# Patient Record
Sex: Male | Born: 1960 | Race: White | Hispanic: No | Smoking: Former smoker
Health system: Southern US, Community
[De-identification: ages and names within clinical notes are randomized; demographics above are authoritative.]

## PROBLEM LIST (undated history)

## (undated) DIAGNOSIS — Z952 Presence of prosthetic heart valve: Secondary | ICD-10-CM

## (undated) DIAGNOSIS — E7800 Pure hypercholesterolemia, unspecified: Secondary | ICD-10-CM

## (undated) DIAGNOSIS — I10 Essential (primary) hypertension: Secondary | ICD-10-CM

## (undated) DIAGNOSIS — I71019 Dissection of thoracic aorta, unspecified: Secondary | ICD-10-CM

## (undated) DIAGNOSIS — I441 Atrioventricular block, second degree: Secondary | ICD-10-CM

## (undated) HISTORY — DX: Atrioventricular block, second degree: I44.1

## (undated) HISTORY — DX: Dissection of thoracic aorta, unspecified: I71.019

## (undated) HISTORY — PX: AORTIC VALVE REPLACEMENT: SHX41

## (undated) HISTORY — PX: OTHER SURGICAL HISTORY: SHX169

## (undated) HISTORY — DX: Presence of prosthetic heart valve: Z95.2

---

## 2010-09-24 HISTORY — PX: COLONOSCOPY: SHX174

## 2016-03-31 ENCOUNTER — Emergency Department (HOSPITAL_COMMUNITY): Payer: 59 | Admitting: Certified Registered Nurse Anesthetist

## 2016-03-31 ENCOUNTER — Inpatient Hospital Stay (HOSPITAL_COMMUNITY)
Admission: EM | Admit: 2016-03-31 | Discharge: 2016-04-16 | DRG: 219 | Disposition: A | Discharge status: Home / Self Care | Payer: 59 | Attending: Cardiothoracic Surgery | Admitting: Cardiothoracic Surgery

## 2016-03-31 ENCOUNTER — Encounter (HOSPITAL_COMMUNITY)
Admission: EM | Disposition: A | Discharge status: Home / Self Care | Payer: Self-pay | Source: Home / Self Care | Attending: Cardiothoracic Surgery

## 2016-03-31 ENCOUNTER — Encounter (HOSPITAL_COMMUNITY): Payer: Self-pay

## 2016-03-31 ENCOUNTER — Emergency Department (HOSPITAL_COMMUNITY): Payer: 59

## 2016-03-31 DIAGNOSIS — I959 Hypotension, unspecified: Secondary | ICD-10-CM | POA: Diagnosis not present

## 2016-03-31 DIAGNOSIS — N400 Enlarged prostate without lower urinary tract symptoms: Secondary | ICD-10-CM | POA: Diagnosis present

## 2016-03-31 DIAGNOSIS — K7200 Acute and subacute hepatic failure without coma: Secondary | ICD-10-CM | POA: Diagnosis not present

## 2016-03-31 DIAGNOSIS — J90 Pleural effusion, not elsewhere classified: Secondary | ICD-10-CM | POA: Diagnosis not present

## 2016-03-31 DIAGNOSIS — I713 Abdominal aortic aneurysm, ruptured, unspecified: Secondary | ICD-10-CM

## 2016-03-31 DIAGNOSIS — E785 Hyperlipidemia, unspecified: Secondary | ICD-10-CM | POA: Diagnosis present

## 2016-03-31 DIAGNOSIS — D696 Thrombocytopenia, unspecified: Secondary | ICD-10-CM | POA: Diagnosis not present

## 2016-03-31 DIAGNOSIS — J9601 Acute respiratory failure with hypoxia: Secondary | ICD-10-CM | POA: Diagnosis not present

## 2016-03-31 DIAGNOSIS — I10 Essential (primary) hypertension: Secondary | ICD-10-CM | POA: Insufficient documentation

## 2016-03-31 DIAGNOSIS — Z87891 Personal history of nicotine dependence: Secondary | ICD-10-CM | POA: Diagnosis not present

## 2016-03-31 DIAGNOSIS — E872 Acidosis: Secondary | ICD-10-CM | POA: Diagnosis not present

## 2016-03-31 DIAGNOSIS — D62 Acute posthemorrhagic anemia: Secondary | ICD-10-CM | POA: Insufficient documentation

## 2016-03-31 DIAGNOSIS — E871 Hypo-osmolality and hyponatremia: Secondary | ICD-10-CM | POA: Diagnosis not present

## 2016-03-31 DIAGNOSIS — N990 Postprocedural (acute) (chronic) kidney failure: Secondary | ICD-10-CM | POA: Diagnosis not present

## 2016-03-31 DIAGNOSIS — T85598A Other mechanical complication of other gastrointestinal prosthetic devices, implants and grafts, initial encounter: Secondary | ICD-10-CM

## 2016-03-31 DIAGNOSIS — J9690 Respiratory failure, unspecified, unspecified whether with hypoxia or hypercapnia: Secondary | ICD-10-CM

## 2016-03-31 DIAGNOSIS — I7101 Dissection of thoracic aorta: Secondary | ICD-10-CM | POA: Diagnosis not present

## 2016-03-31 DIAGNOSIS — E876 Hypokalemia: Secondary | ICD-10-CM | POA: Insufficient documentation

## 2016-03-31 DIAGNOSIS — Z4659 Encounter for fitting and adjustment of other gastrointestinal appliance and device: Secondary | ICD-10-CM

## 2016-03-31 DIAGNOSIS — R7989 Other specified abnormal findings of blood chemistry: Secondary | ICD-10-CM

## 2016-03-31 DIAGNOSIS — R570 Cardiogenic shock: Secondary | ICD-10-CM | POA: Diagnosis not present

## 2016-03-31 DIAGNOSIS — R0682 Tachypnea, not elsewhere classified: Secondary | ICD-10-CM | POA: Insufficient documentation

## 2016-03-31 DIAGNOSIS — D72829 Elevated white blood cell count, unspecified: Secondary | ICD-10-CM | POA: Diagnosis not present

## 2016-03-31 DIAGNOSIS — Z6836 Body mass index (BMI) 36.0-36.9, adult: Secondary | ICD-10-CM | POA: Diagnosis not present

## 2016-03-31 DIAGNOSIS — I351 Nonrheumatic aortic (valve) insufficiency: Secondary | ICD-10-CM | POA: Diagnosis present

## 2016-03-31 DIAGNOSIS — J9811 Atelectasis: Secondary | ICD-10-CM

## 2016-03-31 DIAGNOSIS — Z8249 Family history of ischemic heart disease and other diseases of the circulatory system: Secondary | ICD-10-CM

## 2016-03-31 DIAGNOSIS — R739 Hyperglycemia, unspecified: Secondary | ICD-10-CM | POA: Diagnosis not present

## 2016-03-31 DIAGNOSIS — R0602 Shortness of breath: Secondary | ICD-10-CM

## 2016-03-31 DIAGNOSIS — I313 Pericardial effusion (noninflammatory): Secondary | ICD-10-CM | POA: Diagnosis not present

## 2016-03-31 DIAGNOSIS — R079 Chest pain, unspecified: Secondary | ICD-10-CM | POA: Diagnosis not present

## 2016-03-31 DIAGNOSIS — E7800 Pure hypercholesterolemia, unspecified: Secondary | ICD-10-CM | POA: Diagnosis present

## 2016-03-31 DIAGNOSIS — I7103 Dissection of thoracoabdominal aorta: Secondary | ICD-10-CM | POA: Diagnosis not present

## 2016-03-31 DIAGNOSIS — D689 Coagulation defect, unspecified: Secondary | ICD-10-CM | POA: Diagnosis not present

## 2016-03-31 DIAGNOSIS — E669 Obesity, unspecified: Secondary | ICD-10-CM | POA: Insufficient documentation

## 2016-03-31 DIAGNOSIS — I7100 Dissection of unspecified site of aorta: Secondary | ICD-10-CM

## 2016-03-31 DIAGNOSIS — Z951 Presence of aortocoronary bypass graft: Secondary | ICD-10-CM

## 2016-03-31 DIAGNOSIS — Z9889 Other specified postprocedural states: Secondary | ICD-10-CM

## 2016-03-31 DIAGNOSIS — Z978 Presence of other specified devices: Secondary | ICD-10-CM

## 2016-03-31 DIAGNOSIS — R1310 Dysphagia, unspecified: Secondary | ICD-10-CM | POA: Insufficient documentation

## 2016-03-31 DIAGNOSIS — R509 Fever, unspecified: Secondary | ICD-10-CM | POA: Insufficient documentation

## 2016-03-31 DIAGNOSIS — R778 Other specified abnormalities of plasma proteins: Secondary | ICD-10-CM

## 2016-03-31 DIAGNOSIS — T502X5A Adverse effect of carbonic-anhydrase inhibitors, benzothiadiazides and other diuretics, initial encounter: Secondary | ICD-10-CM | POA: Diagnosis not present

## 2016-03-31 DIAGNOSIS — I71019 Dissection of thoracic aorta, unspecified: Secondary | ICD-10-CM | POA: Diagnosis present

## 2016-03-31 DIAGNOSIS — R5381 Other malaise: Secondary | ICD-10-CM | POA: Diagnosis not present

## 2016-03-31 DIAGNOSIS — R9431 Abnormal electrocardiogram [ECG] [EKG]: Secondary | ICD-10-CM

## 2016-03-31 HISTORY — PX: REPLACEMENT ASCENDING AORTA: SHX6068

## 2016-03-31 HISTORY — DX: Pure hypercholesterolemia, unspecified: E78.00

## 2016-03-31 HISTORY — DX: Essential (primary) hypertension: I10

## 2016-03-31 LAB — PREPARE RBC (CROSSMATCH)

## 2016-03-31 LAB — ABO/RH: ABO/RH(D): B NEG

## 2016-03-31 LAB — BASIC METABOLIC PANEL
Anion gap: 8 (ref 5–15)
BUN: 14 mg/dL (ref 6–20)
CHLORIDE: 107 mmol/L (ref 101–111)
CO2: 24 mmol/L (ref 22–32)
Calcium: 9.6 mg/dL (ref 8.9–10.3)
Creatinine, Ser: 1.33 mg/dL — ABNORMAL HIGH (ref 0.61–1.24)
GFR calc Af Amer: >60 mL/min (ref >60)
GFR, EST NON AFRICAN AMERICAN: 59 mL/min — AB (ref >60)
Glucose, Bld: 115 mg/dL — ABNORMAL HIGH (ref 65–99)
Potassium: 4.4 mmol/L (ref 3.5–5.1)
Sodium: 139 mmol/L (ref 135–145)

## 2016-03-31 LAB — I-STAT ARTERIAL BLOOD GAS, ED
Acid-base deficit: 5 mmol/L — ABNORMAL HIGH (ref 0.0–2.0)
Bicarbonate: 19.9 mEq/L — ABNORMAL LOW (ref 20.0–24.0)
O2 Saturation: 89 %
Patient temperature: 97.9
TCO2: 21 mmol/L (ref 0–100)
pCO2 arterial: 35.7 mmHg (ref 35.0–45.0)
pH, Arterial: 7.352 (ref 7.350–7.450)
pO2, Arterial: 58 mmHg — ABNORMAL LOW (ref 80.0–100.0)

## 2016-03-31 LAB — ECHOCARDIOGRAM LIMITED
Height: 74 in
Weight: 4560 oz

## 2016-03-31 LAB — I-STAT TROPONIN, ED
TROPONIN I, POC: 0.09 ng/mL — AB (ref 0.00–0.08)
Troponin i, poc: 0.02 ng/mL (ref 0.00–0.08)

## 2016-03-31 LAB — APTT: aPTT: 33 seconds (ref 24–37)

## 2016-03-31 LAB — CBC
HEMATOCRIT: 42.7 % (ref 39.0–52.0)
Hemoglobin: 15 g/dL (ref 13.0–17.0)
MCH: 32.8 pg (ref 26.0–34.0)
MCHC: 35.1 g/dL (ref 30.0–36.0)
MCV: 93.2 fL (ref 78.0–100.0)
Platelets: 211 10*3/uL (ref 150–400)
RBC: 4.58 MIL/uL (ref 4.22–5.81)
RDW: 13.3 % (ref 11.5–15.5)
WBC: 6.1 10*3/uL (ref 4.0–10.5)

## 2016-03-31 LAB — D-DIMER, QUANTITATIVE (NOT AT ARMC): D DIMER QUANT: 19.54 ug{FEU}/mL — AB (ref 0.00–0.50)

## 2016-03-31 LAB — PROTIME-INR
INR: 1.12 (ref 0.00–1.49)
PROTHROMBIN TIME: 14.6 s (ref 11.6–15.2)

## 2016-03-31 SURGERY — REPLACEMENT, AORTA, ASCENDING
Anesthesia: General | Site: Chest

## 2016-03-31 MED ORDER — FENTANYL CITRATE (PF) 250 MCG/5ML IJ SOLN
INTRAMUSCULAR | Status: DC | PRN
  Administered 2016-03-31: 300 ug via INTRAVENOUS
  Administered 2016-03-31 (×3): 150 ug via INTRAVENOUS
  Administered 2016-03-31: 50 ug via INTRAVENOUS
  Administered 2016-03-31: 150 ug via INTRAVENOUS
  Administered 2016-03-31 (×3): 100 ug via INTRAVENOUS
  Administered 2016-03-31: 50 ug via INTRAVENOUS
  Administered 2016-03-31 (×2): 100 ug via INTRAVENOUS

## 2016-03-31 MED ORDER — BISACODYL 5 MG PO TBEC: 5.0000 mg | DELAYED_RELEASE_TABLET | Freq: Once | ORAL | Status: DC

## 2016-03-31 MED ORDER — MIDAZOLAM HCL 10 MG/2ML IJ SOLN
INTRAMUSCULAR | Status: AC
  Filled 2016-03-31: qty 4

## 2016-03-31 MED ORDER — EPINEPHRINE HCL 1 MG/ML IJ SOLN
0.0000 ug/min | INTRAMUSCULAR | Status: AC
  Administered 2016-04-01: 2 ug/min via INTRAVENOUS
  Administered 2016-04-01: 3 ug/min via INTRAVENOUS
  Filled 2016-03-31: qty 4

## 2016-03-31 MED ORDER — MORPHINE SULFATE (PF) 4 MG/ML IV SOLN
4.0000 mg | Freq: Once | INTRAVENOUS | Status: AC
  Administered 2016-03-31: 4 mg via INTRAVENOUS
  Filled 2016-03-31: qty 1

## 2016-03-31 MED ORDER — ESMOLOL HCL-SODIUM CHLORIDE 2000 MG/100ML IV SOLN
25.0000 ug/kg/min | Freq: Once | INTRAVENOUS | Status: DC
  Filled 2016-03-31: qty 100

## 2016-03-31 MED ORDER — GI COCKTAIL ~~LOC~~
30.0000 mL | Freq: Once | ORAL | Status: AC
Start: 2016-03-31 — End: 2016-03-31
  Administered 2016-03-31: 30 mL via ORAL

## 2016-03-31 MED ORDER — SUCCINYLCHOLINE CHLORIDE 200 MG/10ML IV SOSY
PREFILLED_SYRINGE | INTRAVENOUS | Status: AC
  Filled 2016-03-31: qty 10

## 2016-03-31 MED ORDER — LIDOCAINE 2% (20 MG/ML) 5 ML SYRINGE
INTRAMUSCULAR | Status: AC
  Filled 2016-03-31: qty 5

## 2016-03-31 MED ORDER — CHLORHEXIDINE GLUCONATE 0.12 % MT SOLN
15.0000 mL | Freq: Once | OROMUCOSAL | Status: AC
  Administered 2016-03-31: 15 mL via OROMUCOSAL
  Filled 2016-03-31: qty 15

## 2016-03-31 MED ORDER — FENTANYL CITRATE (PF) 250 MCG/5ML IJ SOLN
INTRAMUSCULAR | Status: AC
  Filled 2016-03-31: qty 30

## 2016-03-31 MED ORDER — LACTATED RINGERS IV SOLN
INTRAVENOUS | Status: DC | PRN
  Administered 2016-03-31 – 2016-04-01 (×2): via INTRAVENOUS

## 2016-03-31 MED ORDER — ASPIRIN 81 MG PO CHEW
324.0000 mg | CHEWABLE_TABLET | Freq: Once | ORAL | Status: AC
  Administered 2016-03-31: 324 mg via ORAL
  Filled 2016-03-31: qty 4

## 2016-03-31 MED ORDER — ONDANSETRON HCL 4 MG/2ML IJ SOLN
4.0000 mg | Freq: Once | INTRAMUSCULAR | Status: AC
  Administered 2016-03-31: 4 mg via INTRAVENOUS
  Filled 2016-03-31: qty 2

## 2016-03-31 MED ORDER — PHENYLEPHRINE 40 MCG/ML (10ML) SYRINGE FOR IV PUSH (FOR BLOOD PRESSURE SUPPORT)
PREFILLED_SYRINGE | INTRAVENOUS | Status: AC
  Filled 2016-03-31: qty 10

## 2016-03-31 MED ORDER — NITROGLYCERIN IN D5W 200-5 MCG/ML-% IV SOLN
2.0000 ug/min | INTRAVENOUS | Status: DC
Start: 2016-03-31 — End: 2016-04-01
  Filled 2016-03-31: qty 250

## 2016-03-31 MED ORDER — SODIUM CHLORIDE 0.9 % IV SOLN
INTRAVENOUS | Status: AC
  Administered 2016-03-31: 4 mL
  Administered 2016-03-31: 36 mL
  Filled 2016-03-31: qty 30

## 2016-03-31 MED ORDER — ETOMIDATE 2 MG/ML IV SOLN
INTRAVENOUS | Status: DC | PRN
  Administered 2016-03-31 (×2): 20 mg via INTRAVENOUS

## 2016-03-31 MED ORDER — FAMOTIDINE 20 MG PO TABS
20.0000 mg | ORAL_TABLET | Freq: Once | ORAL | Status: AC
  Administered 2016-03-31: 20 mg via ORAL
  Filled 2016-03-31: qty 1

## 2016-03-31 MED ORDER — LORAZEPAM 1 MG PO TABS
1.0000 mg | ORAL_TABLET | Freq: Once | ORAL | Status: AC
  Administered 2016-03-31: 1 mg via ORAL
  Filled 2016-03-31: qty 1

## 2016-03-31 MED ORDER — ACETAMINOPHEN 325 MG PO TABS
650.0000 mg | ORAL_TABLET | Freq: Once | ORAL | Status: AC
  Administered 2016-03-31: 650 mg via ORAL
  Filled 2016-03-31: qty 2

## 2016-03-31 MED ORDER — MIDAZOLAM HCL 5 MG/5ML IJ SOLN
INTRAMUSCULAR | Status: DC | PRN
  Administered 2016-03-31 (×2): 4 mg via INTRAVENOUS
  Administered 2016-03-31 – 2016-04-01 (×2): 2 mg via INTRAVENOUS
  Administered 2016-04-01: 8 mg via INTRAVENOUS

## 2016-03-31 MED ORDER — LACTATED RINGERS IV SOLN
INTRAVENOUS | Status: DC | PRN
  Administered 2016-03-31: 18:00:00 via INTRAVENOUS

## 2016-03-31 MED ORDER — ROCURONIUM BROMIDE 100 MG/10ML IV SOLN
INTRAVENOUS | Status: DC | PRN
  Administered 2016-03-31: 100 mg via INTRAVENOUS
  Administered 2016-03-31 – 2016-04-01 (×4): 50 mg via INTRAVENOUS

## 2016-03-31 MED ORDER — PHENYLEPHRINE HCL 10 MG/ML IJ SOLN
30.0000 ug/min | INTRAVENOUS | Status: AC
  Administered 2016-03-31 (×2): 20 ug/min via INTRAVENOUS
  Filled 2016-03-31: qty 2

## 2016-03-31 MED ORDER — NITROGLYCERIN 0.4 MG SL SUBL
0.4000 mg | SUBLINGUAL_TABLET | SUBLINGUAL | Status: DC | PRN
  Administered 2016-03-31: 0.4 mg via SUBLINGUAL
  Filled 2016-03-31: qty 1

## 2016-03-31 MED ORDER — TEMAZEPAM 15 MG PO CAPS: 15.0000 mg | ORAL_CAPSULE | Freq: Once | ORAL | Status: DC | PRN

## 2016-03-31 MED ORDER — PROPOFOL 10 MG/ML IV BOLUS
INTRAVENOUS | Status: AC
  Filled 2016-03-31: qty 20

## 2016-03-31 MED ORDER — SODIUM CHLORIDE 0.9 % IV SOLN
INTRAVENOUS | Status: AC
  Administered 2016-03-31: 1.5 [IU]/h via INTRAVENOUS
  Filled 2016-03-31: qty 2.5

## 2016-03-31 MED ORDER — SODIUM CHLORIDE 0.9 % IV BOLUS (SEPSIS)
1000.0000 mL | Freq: Once | INTRAVENOUS | Status: AC
  Administered 2016-03-31: 1000 mL via INTRAVENOUS

## 2016-03-31 MED ORDER — MAGNESIUM SULFATE 50 % IJ SOLN
40.0000 meq | INTRAMUSCULAR | Status: DC
  Filled 2016-03-31: qty 10

## 2016-03-31 MED ORDER — IOPAMIDOL (ISOVUE-370) INJECTION 76%
INTRAVENOUS | Status: AC
  Administered 2016-03-31: 100 mL
  Filled 2016-03-31: qty 100

## 2016-03-31 MED ORDER — DEXTROSE 5 % IV SOLN
750.0000 mg | INTRAVENOUS | Status: DC
  Filled 2016-03-31: qty 750

## 2016-03-31 MED ORDER — 0.9 % SODIUM CHLORIDE (POUR BTL) OPTIME
TOPICAL | Status: DC | PRN
  Administered 2016-03-31: 5000 mL

## 2016-03-31 MED ORDER — METOPROLOL TARTRATE 25 MG PO TABS: 12.5000 mg | ORAL_TABLET | Freq: Once | ORAL | Status: DC

## 2016-03-31 MED ORDER — ROCURONIUM BROMIDE 50 MG/5ML IV SOLN
INTRAVENOUS | Status: AC
  Filled 2016-03-31: qty 3

## 2016-03-31 MED ORDER — EPHEDRINE 5 MG/ML INJ
INTRAVENOUS | Status: AC
  Filled 2016-03-31: qty 10

## 2016-03-31 MED ORDER — METHYLPREDNISOLONE SODIUM SUCC 125 MG IJ SOLR
125.0000 mg | INTRAMUSCULAR | Status: AC
  Administered 2016-03-31: 125 mg via INTRAVENOUS
  Filled 2016-03-31: qty 2

## 2016-03-31 MED ORDER — POTASSIUM CHLORIDE 2 MEQ/ML IV SOLN
80.0000 meq | INTRAVENOUS | Status: DC
  Filled 2016-03-31: qty 40

## 2016-03-31 MED ORDER — PLASMA-LYTE 148 IV SOLN
INTRAVENOUS | Status: AC
  Administered 2016-03-31: 500 mL
  Filled 2016-03-31: qty 2.5

## 2016-03-31 MED ORDER — ROCURONIUM BROMIDE 50 MG/5ML IV SOLN
INTRAVENOUS | Status: AC
  Filled 2016-03-31: qty 1

## 2016-03-31 MED ORDER — SODIUM CHLORIDE 0.9 % IJ SOLN
OROMUCOSAL | Status: DC | PRN
  Administered 2016-03-31 (×5): 4 mL via TOPICAL

## 2016-03-31 MED ORDER — CEFUROXIME SODIUM 1.5 G IJ SOLR
1.5000 g | INTRAMUSCULAR | Status: AC
  Administered 2016-03-31: 1.5 g via INTRAVENOUS
  Administered 2016-04-01: .75 g via INTRAVENOUS
  Filled 2016-03-31: qty 1.5

## 2016-03-31 MED ORDER — VANCOMYCIN HCL 10 G IV SOLR
1250.0000 mg | INTRAVENOUS | Status: AC
  Administered 2016-03-31: 1250 mg via INTRAVENOUS
  Filled 2016-03-31: qty 1250

## 2016-03-31 MED ORDER — DOPAMINE-DEXTROSE 3.2-5 MG/ML-% IV SOLN
0.0000 ug/kg/min | INTRAVENOUS | Status: DC
  Filled 2016-03-31: qty 250

## 2016-03-31 MED ORDER — ALUM & MAG HYDROXIDE-SIMETH 200-200-20 MG/5ML PO SUSP: 30.0000 mL | Freq: Once | ORAL | Status: DC

## 2016-03-31 MED ORDER — DEXMEDETOMIDINE HCL IN NACL 400 MCG/100ML IV SOLN
0.1000 ug/kg/h | INTRAVENOUS | Status: AC
  Administered 2016-03-31: .3 ug/kg/h via INTRAVENOUS
  Filled 2016-03-31 (×3): qty 100

## 2016-03-31 MED ORDER — SUCCINYLCHOLINE 20MG/ML (10ML) SYRINGE FOR MEDFUSION PUMP - OPTIME
INTRAMUSCULAR | Status: DC | PRN
  Administered 2016-03-31: 200 mg via INTRAVENOUS

## 2016-03-31 MED ORDER — SODIUM CHLORIDE 0.9 % IV SOLN
INTRAVENOUS | Status: AC
  Administered 2016-03-31: 69.8 mL/h via INTRAVENOUS
  Filled 2016-03-31 (×2): qty 40

## 2016-03-31 MED ORDER — CHLORHEXIDINE GLUCONATE CLOTH 2 % EX PADS: 6.0000 | MEDICATED_PAD | Freq: Once | CUTANEOUS | Status: DC

## 2016-03-31 MED ORDER — HEPARIN SODIUM (PORCINE) 1000 UNIT/ML IJ SOLN
INTRAMUSCULAR | Status: AC
  Filled 2016-03-31: qty 1

## 2016-03-31 SURGICAL SUPPLY — 112 items
ADAPTER CARDIO PERF ANTE/RETRO (ADAPTER) ×3 IMPLANT
APPLICATOR TIP COSEAL (VASCULAR PRODUCTS) ×3 IMPLANT
ATTRACTOMAT 16X20 MAGNETIC DRP (DRAPES) ×3 IMPLANT
BAG DECANTER FOR FLEXI CONT (MISCELLANEOUS) ×3 IMPLANT
BALLN LINEAR 7.5FR IABP 40CC (BALLOONS) ×3
BALLOON LINEAR 7.5FR IABP 40CC (BALLOONS) ×1 IMPLANT
BLADE STERNUM SYSTEM 6 (BLADE) ×3 IMPLANT
BLADE SURG 15 STRL LF DISP TIS (BLADE) ×2 IMPLANT
BLADE SURG 15 STRL SS (BLADE) ×4
CANISTER SUCTION 2500CC (MISCELLANEOUS) ×3 IMPLANT
CANNULA GRAFT 8MMX50CM (Graft) ×3 IMPLANT
CANNULA GUNDRY RCSP 15FR (MISCELLANEOUS) ×3 IMPLANT
CANNULA SUMP PERICARDIAL (CANNULA) ×3 IMPLANT
CATH COUDE FOLEY 2W 5CC 16FR (CATHETERS) ×3 IMPLANT
CATH FOLEY 2WAY SLVR 18FR 30CC (CATHETERS) ×3 IMPLANT
CATH HEART VENT LEFT (CATHETERS) ×1 IMPLANT
CATH RETROPLEGIA CORONARY 14FR (CATHETERS) IMPLANT
CATH ROBINSON RED A/P 18FR (CATHETERS) ×12 IMPLANT
CAUTERY EYE LOW TEMP 1300F FIN (OPHTHALMIC RELATED) IMPLANT
CAUTERY SURG HI TEMP FINE TIP (MISCELLANEOUS) ×3 IMPLANT
CONT SPECI 4OZ STER CLIK (MISCELLANEOUS) ×3 IMPLANT
COVER SURGICAL LIGHT HANDLE (MISCELLANEOUS) ×6 IMPLANT
CRADLE DONUT ADULT HEAD (MISCELLANEOUS) ×3 IMPLANT
DRAPE CARDIOVASCULAR INCISE (DRAPES) ×2
DRAPE SRG 135X102X78XABS (DRAPES) ×1 IMPLANT
DRSG AQUACEL AG ADV 3.5X14 (GAUZE/BANDAGES/DRESSINGS) ×3 IMPLANT
ELECT CAUTERY BLADE 6.4 (BLADE) IMPLANT
ELECT REM PT RETURN 9FT ADLT (ELECTROSURGICAL) ×6
ELECTRODE REM PT RTRN 9FT ADLT (ELECTROSURGICAL) ×2 IMPLANT
GAUZE SPONGE 4X4 12PLY STRL (GAUZE/BANDAGES/DRESSINGS) ×6 IMPLANT
GLOVE BIO SURGEON STRL SZ 6.5 (GLOVE) ×8 IMPLANT
GLOVE BIO SURGEON STRL SZ7 (GLOVE) ×36 IMPLANT
GLOVE BIO SURGEON STRL SZ7.5 (GLOVE) ×3 IMPLANT
GLOVE BIO SURGEON STRL SZ8 (GLOVE) ×6 IMPLANT
GLOVE BIO SURGEONS STRL SZ 6.5 (GLOVE) ×4
GLOVE BIOGEL PI IND STRL 6 (GLOVE) ×3 IMPLANT
GLOVE BIOGEL PI IND STRL 6.5 (GLOVE) ×1 IMPLANT
GLOVE BIOGEL PI INDICATOR 6 (GLOVE) ×6
GLOVE BIOGEL PI INDICATOR 6.5 (GLOVE) ×2
GOWN STRL REUS W/ TWL LRG LVL3 (GOWN DISPOSABLE) ×4 IMPLANT
GOWN STRL REUS W/ TWL XL LVL3 (GOWN DISPOSABLE) ×2 IMPLANT
GOWN STRL REUS W/TWL LRG LVL3 (GOWN DISPOSABLE) ×8
GOWN STRL REUS W/TWL XL LVL3 (GOWN DISPOSABLE) ×4
GRAFT WOVEN D/V 30DX30L (Vascular Products) ×3 IMPLANT
HANDLE STAPLE ENDO GIA SHORT (STAPLE) ×2
HEMOSTAT POWDER SURGIFOAM 1G (HEMOSTASIS) IMPLANT
HEMOSTAT SURGICEL 2X14 (HEMOSTASIS) IMPLANT
INSERT FOGARTY XLG (MISCELLANEOUS) ×6 IMPLANT
KIT BASIN OR (CUSTOM PROCEDURE TRAY) ×3 IMPLANT
KIT ROOM TURNOVER OR (KITS) ×3 IMPLANT
KIT SUCTION CATH 14FR (SUCTIONS) IMPLANT
LINE VENT (MISCELLANEOUS) ×3 IMPLANT
LOOP VESSEL MINI RED (MISCELLANEOUS) ×3 IMPLANT
LOOP VESSEL SUPERMAXI WHITE (MISCELLANEOUS) ×3 IMPLANT
MARKER GRAFT CORONARY BYPASS (MISCELLANEOUS) IMPLANT
NS IRRIG 1000ML POUR BTL (IV SOLUTION) ×15 IMPLANT
PACK OPEN HEART (CUSTOM PROCEDURE TRAY) ×3 IMPLANT
PAD ARMBOARD 7.5X6 YLW CONV (MISCELLANEOUS) ×6 IMPLANT
RELOAD ENDO GIA 30 3.5 (STAPLE) ×3 IMPLANT
SEALANT SURG COSEAL 8ML (VASCULAR PRODUCTS) ×3 IMPLANT
SET CARDIOPLEGIA MPS 5001102 (MISCELLANEOUS) ×3 IMPLANT
SPONGE GAUZE 4X4 12PLY STER LF (GAUZE/BANDAGES/DRESSINGS) ×3 IMPLANT
SPONGE LAP 18X18 X RAY DECT (DISPOSABLE) ×9 IMPLANT
STAPLER ENDO GIA 12MM SHORT (STAPLE) ×1 IMPLANT
STOPCOCK 4 WAY LG BORE MALE ST (IV SETS) ×3 IMPLANT
SURGIFLO W/THROMBIN 8M KIT (HEMOSTASIS) ×6 IMPLANT
SUT ETHIBON 2 0 V 52N 30 (SUTURE) ×3 IMPLANT
SUT ETHIBOND 2 0 SH (SUTURE) ×3 IMPLANT
SUT PROLENE 3 0 RB 1 (SUTURE) ×3 IMPLANT
SUT PROLENE 3 0 SH DA (SUTURE) ×12 IMPLANT
SUT PROLENE 4 0 RB 1 (SUTURE) ×50
SUT PROLENE 4 0 SH DA (SUTURE) ×51 IMPLANT
SUT PROLENE 4-0 RB1 .5 CRCL 36 (SUTURE) ×22 IMPLANT
SUT PROLENE 4-0 RB1 18X2 ARM (SUTURE) ×3 IMPLANT
SUT PROLENE 5 0 C 1 36 (SUTURE) ×6 IMPLANT
SUT SILK  1 MH (SUTURE) ×10
SUT SILK 1 MH (SUTURE) ×5 IMPLANT
SUT SILK 1 TIES 10X30 (SUTURE) ×3 IMPLANT
SUT SILK 2 0 (SUTURE) ×2
SUT SILK 2 0 SH CR/8 (SUTURE) ×12 IMPLANT
SUT SILK 2-0 18XBRD TIE 12 (SUTURE) ×1 IMPLANT
SUT SILK 3 0 SH CR/8 (SUTURE) ×3 IMPLANT
SUT SILK 3 0 TIES 10X30 (SUTURE) ×3 IMPLANT
SUT SILK 4 0 (SUTURE) ×2
SUT SILK 4-0 18XBRD TIE 12 (SUTURE) ×1 IMPLANT
SUT STEEL STERNAL CCS#1 18IN (SUTURE) ×3 IMPLANT
SUT STEEL SZ 6 DBL 3X14 BALL (SUTURE) ×6 IMPLANT
SUT TEM PAC WIRE 2 0 SH (SUTURE) ×12 IMPLANT
SUT VIC AB 1 CTX 18 (SUTURE) ×3 IMPLANT
SUT VIC AB 2-0 CT1 27 (SUTURE) ×2
SUT VIC AB 2-0 CT1 TAPERPNT 27 (SUTURE) ×1 IMPLANT
SUT VIC AB 2-0 CTX 27 (SUTURE) ×6 IMPLANT
SUT VIC AB 3-0 SH 18 (SUTURE) ×3 IMPLANT
SUT VIC AB 3-0 X1 27 (SUTURE) ×3 IMPLANT
SYR 10ML KIT SKIN ADHESIVE (MISCELLANEOUS) ×9 IMPLANT
SYRINGE TOOMEY DISP (SYRINGE) ×6 IMPLANT
SYSTEM SAHARA CHEST DRAIN ATS (WOUND CARE) ×3 IMPLANT
TAPE CLOTH SURG 4X10 WHT LF (GAUZE/BANDAGES/DRESSINGS) ×3 IMPLANT
TAPE PAPER MEDFIX 1IN X 10YD (GAUZE/BANDAGES/DRESSINGS) ×3 IMPLANT
TOWEL OR 17X24 6PK STRL BLUE (TOWEL DISPOSABLE) ×3 IMPLANT
TOWEL OR 17X26 10 PK STRL BLUE (TOWEL DISPOSABLE) ×3 IMPLANT
TRAY CATH LUMEN 1 20CM STRL (SET/KITS/TRAYS/PACK) ×3 IMPLANT
TRAY FOLEY CATH 14FRSI W/METER (CATHETERS) IMPLANT
TRAY FOLEY IC TEMP SENS 14FR (CATHETERS) IMPLANT
TUBE CONNECTING 12'X1/4 (SUCTIONS) ×1
TUBE CONNECTING 12X1/4 (SUCTIONS) ×2 IMPLANT
TUBE SUCT INTRACARD DLP 20F (MISCELLANEOUS) ×3 IMPLANT
TUBING ART PRESS 48 MALE/FEM (TUBING) ×6 IMPLANT
UNDERPAD 30X30 INCONTINENT (UNDERPADS AND DIAPERS) ×3 IMPLANT
VENT LEFT HEART 12002 (CATHETERS) ×3
WATER STERILE IRR 1000ML POUR (IV SOLUTION) ×6 IMPLANT
YANKAUER SUCT BULB TIP NO VENT (SUCTIONS) ×3 IMPLANT

## 2016-03-31 NOTE — ED Notes (Signed)
Patient here with left anterior chest pain that he describes as a sharp pain with tingling to left arm. Had lightheadedness with same. States that the sympotms started while he was working out today.

## 2016-03-31 NOTE — Consult Note (Signed)
Full note dictated.  CC: difficult foley insertion.  Hx: 55 yo WM on OR table for repair of Aortic Dissection.   Attempt at foley placement was unsuccessful.  PMH: Hypertension and Hypercholesterolemia.  PSH: Appendectomy.  SH: Former smoker.  FH: HTN, CAD  All: NKDA  Meds: Simvastatin, MVI, ASA and Mg/K  ROS:  Pt intubated.  PE: VS reviewed. Gen WD, Obese WM under GA. GU: Nl phallus with adequate meatus.  Scrotum and contents normal.  Procedure:  38fr coude foley catheter placed after betadine prep and 5ml of 2% lidocaine jelly intraurethrally.   Imp:  Difficult foley placement.  Rec: Remove foley per routine.

## 2016-03-31 NOTE — Anesthesia Procedure Notes (Signed)
Procedure Name: Intubation Date/Time: 03/31/2016 6:28 PM Performed by: Lance Coon Pre-anesthesia Checklist: Patient identified, Emergency Drugs available, Timeout performed, Patient being monitored and Suction available Patient Re-evaluated:Patient Re-evaluated prior to inductionOxygen Delivery Method: Circle system utilized Preoxygenation: Pre-oxygenation with 100% oxygen Intubation Type: IV induction, Cricoid Pressure applied and Rapid sequence Laryngoscope Size: Miller and 3 Grade View: Grade II Tube type: Subglottic suction tube Tube size: 8.0 mm Number of attempts: 1 Airway Equipment and Method: Bougie stylet Placement Confirmation: ETT inserted through vocal cords under direct vision,  positive ETCO2 and breath sounds checked- equal and bilateral Secured at: 22 cm Tube secured with: Tape Dental Injury: Teeth and Oropharynx as per pre-operative assessment

## 2016-03-31 NOTE — Consult Note (Signed)
Cardiology Consultation Note    Patient ID: Jon Rodriguez, MRN: [id number], DOB/AGE: August 27, 1961 55 y.o. Admit date: 03/31/2016   Date of Consult: 03/31/2016 Primary Physician: No primary care provider on file. Primary Cardiologist: New to Dr. Sallyanne Kuster  Chief Complaint: chest pain Reason for Consultation: chest pain Requesting MD: Dr. Ashok Cordia  HPI: Jon Rodriguez is a 55 y.o. male with history of HTN, high cholesterol, obesity, former tobacco abuse, and family history of CAD (mother with CABG) who presented to [facility] with CP and elevated troponin level. He has been working out the last 8 weeks in attempt to get healthier. He had just finished bicep curls when he developed sharp chest pain in the middle of his chest which lasted about 30 seconds then progressed to a dull retrosternal pressure. Pain was worse with exhalation. He has noticed some lightheadedness as well. He has not had any recent provocative factors for PE including recent travel, surgery, bedrest although d-dimer returned at 19. He was given NTG in the ER which did not relieve his pain; he was subsequently given morphine which did help but it dropped his pressure to 78/47 -> pain has improved to 2/10. He was also treated for possible reflux with GERD meds. He is pain free now. Not tachycardic. Pulse ox ranging 90-98%. Labs notable for normal CBC, Cr 1.33 without prior to compare to, glucose 115, troponin 0.02 then ?0.09.  Past Medical History  Diagnosis Date  . Hypertension   . High cholesterol       Surgical History: History reviewed. No pertinent past surgical history.   Home Meds: Prior to Admission medications   Medication Sig Start Date End Date Taking? Authorizing Provider  aspirin 325 MG EC tablet Take 325 mg by mouth daily.   Yes Historical Provider, MD  Fish Oil-Cholecalciferol (FISH OIL + D3 PO) Take 2 capsules by mouth daily.   Yes Historical Provider, MD  MAGNESIUM-POTASSIUM PO Take 1 tablet by mouth daily.   Yes  Historical Provider, MD  Multiple Vitamin (MULTIVITAMIN) capsule Take 1 capsule by mouth daily.   Yes Historical Provider, MD  simvastatin (ZOCOR) 20 MG tablet Take 1 tablet by mouth daily. 03/03/16  Yes Historical Provider, MD    Inpatient Medications:       Allergies: No Known Allergies  Social History   Social History  . Marital Status: Divorced    Spouse Name: N/A  . Number of Children: N/A  . Years of Education: N/A   Occupational History  . Not on file.   Social History Main Topics  . Smoking status: Former Research scientist (life sciences)  . Smokeless tobacco: Not on file  . Alcohol Use: No  . Drug Use: Not on file  . Sexual Activity: Not on file   Other Topics Concern  . Not on file   Social History Narrative     Family History  Problem Relation Age of Onset  . Hypertension      SIblings  . CAD Mother     CABG     Review of Systems: All other systems reviewed and are otherwise negative except as noted above.  Labs:  Lab Results  Component Value Date   WBC 6.1 03/31/2016   HGB 15.0 03/31/2016   HCT 42.7 03/31/2016   MCV 93.2 03/31/2016   PLT 211 03/31/2016    Recent Labs Lab 03/31/16 1148  NA 139  K 4.4  CL 107  CO2 24  BUN 14  CREATININE 1.33*  CALCIUM 9.6  GLUCOSE  115*   No results found for: CHOL, HDL, LDLCALC, TRIG Lab Results  Component Value Date   DDIMER 19.54* 03/31/2016    Radiology/Studies:  Dg Chest 2 View  03/31/2016  CLINICAL DATA:  55 year old male with a history of chest pain EXAM: CHEST  2 VIEW COMPARISON:  None. FINDINGS: Cardiac diameter within normal limits. Tortuosity descending thoracic aorta. No evidence of central vascular congestion or interlobular septal thickening. No pleural effusion or pneumothorax.  No confluent airspace disease. No displaced fracture. IMPRESSION: No radiographic evidence of acute cardiopulmonary disease. Signed, Dulcy Fanny. Earleen Newport, DO [facility] Radiology Electronically  Signed   By: Corrie Mckusick D.O.   On: 03/31/2016 12:21    Wt Readings from Last 3 Encounters:  03/31/16 285 lb (129.275 kg)    EKG: NSR 52bpm ST upsloping inferiorly with TWI I, avL, V2 - f/u EKGs are similar - ST elevation 1/4 mm  Physical Exam: Blood pressure 92/56, pulse 67, temperature 97.9 F (36.6 C), temperature source Oral, resp. rate 15, height 6\' 2"  (1.88 m), weight 285 lb (129.275 kg), SpO2 92 %. Body mass index is 36.58 kg/(m^2). General: Well developed, well nourished obese, in no acute distress. Head: Normocephalic, atraumatic, sclera non-icteric, no xanthomas, nares are without discharge.  Neck: Negative for carotid bruits. JVD not elevated. Lungs: Clear bilaterally to auscultation without wheezes, rales, or rhonchi. Breathing is unlabored. Heart: RRR with S1 S2. Normal P2. No murmurs, rubs, or gallops appreciated. Abdomen: Soft, non-tender, non-distended with normoactive bowel sounds. No hepatomegaly. No rebound/guarding. No obvious abdominal masses. Msk:  Strength and tone appear normal for age. Extremities: No clubbing or cyanosis. No edema or erythema.  Distal pedal pulses are 2+ and equal bilaterally. Neuro: Alert and oriented X 3. No facial asymmetry. No focal deficit. Moves all extremities spontaneously. Psych:  Responds to questions appropriately with a normal affect.     Assessment and Plan  The patient was seen and examined by Dr. Sallyanne Kuster, I acted as a Education administrator for this note. See below for A/P. Echo tech called for stat echo.  1. Atypical type chest pain but with elevated troponin level and concern for aortic dissection on CT 2. H/o HTN with hypotension in ER 3. Hyperlipidemia 4. Renal insufficiency of unknown chronicity  Signed, Charlie Pitter PA-C 03/31/2016, 3:54 PM Pager: [phone]   I have seen and examined the patient along with Dayna N Dunn PA-C.  I have reviewed the chart, notes and new data.  I agree with PA's note.  Key new complaints: unusual  chest pain description - severe pulsating retrosternal chest discomfort ("felt I was going to die:) followed by a moderate 4-5/10 uneasy retrosternal pressure. Pain slightly worse during expiration Key examination changes: blood pressure not elevated in either arm - 109/70 before NTG and morphine, 90/60 after those meds; no murmur/bruits/pericardial rub Key new findings / data: minimally abnormal troponin, < 0.5 mm new ST elevation inferior leads, D-dimer>19. CXR with slight mediastinal broadening  PLAN: CTA (contrast entirely in PA) shows a dilated ascending aorta and root with a suspicion of intimal flap/hematoma (no contrast in aorta), images are being repeated with timing for aortic contrast. Appears to have a proximal (type A) aortic dissection. No murmur of AI. ECG a little concerning for RCA involvement. STAT echo ordered. BP remains low and HR slow - no real room for beta blockade. Will nned emergency aortic repair. Briefly discussed with Dr. Prescott Gum.  Sanda Klein, MD, [place] [phone]  03/31/2016, 4:35 PM

## 2016-03-31 NOTE — Anesthesia Preprocedure Evaluation (Signed)
Anesthesia Evaluation  Patient identified by MRN, date of birth, ID band Patient awake    Reviewed: Allergy & Precautions, NPO status , Patient's Chart, lab work & pertinent test results  Airway Mallampati: II  TM Distance: >3 FB Neck ROM: Full    Dental  (+) Teeth Intact   Pulmonary former smoker,    breath sounds clear to auscultation       Cardiovascular hypertension,  Rhythm:Regular Rate:Normal     Neuro/Psych    GI/Hepatic   Endo/Other    Renal/GU      Musculoskeletal   Abdominal (+) + obese,   Peds  Hematology   Anesthesia Other Findings   Reproductive/Obstetrics                             Anesthesia Physical Anesthesia Plan  ASA: IV and emergent  Anesthesia Plan: General   Post-op Pain Management:    Induction: Intravenous  Airway Management Planned: Oral ETT  Additional Equipment: CVP, PA Cath, 3D TEE, Ultrasound Guidance Line Placement and Arterial line  Intra-op Plan:   Post-operative Plan: Post-operative intubation/ventilation  Informed Consent: I have reviewed the patients History and Physical, chart, labs and discussed the procedure including the risks, benefits and alternatives for the proposed anesthesia with the patient or authorized representative who has indicated his/her understanding and acceptance.     Plan Discussed with: CRNA and Anesthesiologist  Anesthesia Plan Comments:         Anesthesia Quick Evaluation

## 2016-03-31 NOTE — H&P (Signed)
[address]       [place], [address]             [phone]        Jon Rodriguez Medical Record [id number] Date of Birth: [birth year]  Referring: ER physician  Primary Care: Melinda Crutch M.D.  Chief Complaint:    Chief Complaint  Patient presents with  . Chest Pain   Patient examined, CT angiogram personally reviewed and discussed with radiologist. 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  History of Present Illness:     55 year old morbidly obese nondiabetic male with history of treated hypertension and moderate smoking felt sudden tearing chest pain at 11 AM today a working out at a fitness center- gym. This did not resolve and he presented to the emergency department. EKG was nonspecific and trach enzymes were first negative and minimally elevated. CT scan rule out pulmonary embolus was negative. The patient was then re-dosed with contrast for a CTA angiogram and this demonstrates a tear in the ascending aorta extending through the arch and descending thoracic aorta down to the aortic abdominal  bifurcation. The innominate artery is not involved by the dissection, the left carotid is not involved, the left subclavian artery appears to be involved with the dissection. The renal and visceral vessels come off the true lumen. The ascending aorta and arch are dilated. The descending thoracic aorta appears to be of normal caliber. There is no clear pericardial effusion.  The patient is still having some substernal pain but not as severe as the onset.  Patient denies family history of aortic thoracic or abdominal aneurysm disease. He has had an appendectomy but otherwise a fairly negative past medical history. He took Norvasc for hypertension but was recently taken off Norvasc after losing weight and his blood pressure normalized.  Current Activity/ Functional Status: Normal function  level-employed as a Biomedical engineer   Zubrod Score: At the time of surgery this patient's most appropriate activity status/level should be described as: []     0    Normal activity, no symptoms []     1    Restricted in physical strenuous activity but ambulatory, able to do out light work []     2    Ambulatory and capable of self care, unable to do work activities, up and about                 more than 50%  Of the time                            []     3    Only limited self care, in bed greater than 50% of waking hours []     4    Completely disabled, no self care, confined to bed or chair []     5    Moribund  Past Medical History  Diagnosis Date  . Hypertension   . High cholesterol     History reviewed. No pertinent past surgical history.  History  Smoking status  . Former Smoker  Smokeless tobacco  . Not on file    History  Alcohol Use No    Social History   Social History  . Marital Status: Divorced    Spouse Name: N/A  . Number of Children: N/A  . Years of Education: N/A   Occupational History  .  Not on file.   Social History Main Topics  . Smoking status: Former Research scientist (life sciences)  . Smokeless tobacco: Not on file  . Alcohol Use: No  . Drug Use: Not on file  . Sexual Activity: Not on file   Other Topics Concern  . Not on file   Social History Narrative    No Known Allergies  Current Facility-Administered Medications  Medication Dose Route Frequency Provider Last Rate Last Dose  . aminocaproic acid (AMICAR) 10 g in sodium chloride 0.9 % 100 mL infusion   Intravenous To OR Shelda Altes, RPH      . bisacodyl (DULCOLAX) EC tablet 5 mg  5 mg Oral Once Ivin Poot, MD      . cefUROXime (ZINACEF) 1.5 g in dextrose 5 % 50 mL IVPB  1.5 g Intravenous To OR Renee J Ackley, RPH      . cefUROXime (ZINACEF) 750 mg in dextrose 5 % 50 mL IVPB  750 mg Intravenous To OR Shelda Altes, RPH      . [START ON 04/01/2016] chlorhexidine (PERIDEX) 0.12 % solution 15 mL  15 mL  Mouth/Throat Once Ivin Poot, MD      . Chlorhexidine Gluconate Cloth 2 % PADS 6 each  6 each Topical Once Ivin Poot, MD      . dexmedetomidine (PRECEDEX) 400 MCG/100ML (4 mcg/mL) infusion  0.1-0.7 mcg/kg/hr Intravenous To OR Shelda Altes, RPH      . DOPamine (INTROPIN) 800 mg in dextrose 5 % 250 mL (3.2 mg/mL) infusion  0-10 mcg/kg/min Intravenous To OR Shelda Altes, RPH      . EPINEPHrine (ADRENALIN) 4 mg in dextrose 5 % 250 mL (0.016 mg/mL) infusion  0-10 mcg/min Intravenous To OR Renee J Ackley, RPH      . esmolol (BREVIBLOC) 2000 mg / 100 mL (20 mg/mL) infusion  25-300 mcg/kg/min Intravenous Once Lajean Saver, MD      . heparin 2,500 Units, papaverine 30 mg in electrolyte-148 (PLASMALYTE-148) 500 mL irrigation   Irrigation To OR Shelda Altes, RPH      . heparin 30,000 units/NS 1000 mL solution for CELLSAVER   Other To OR Shelda Altes, RPH      . insulin regular (NOVOLIN R,HUMULIN R) 250 Units in sodium chloride 0.9 % 250 mL (1 Units/mL) infusion   Intravenous To OR Renee J Ackley, RPH      . magnesium sulfate (IV Push/IM) injection 40 mEq  40 mEq Other To OR Shelda Altes, RPH      . methylPREDNISolone sodium succinate (SOLU-MEDROL) 125 mg/2 mL injection 125 mg  125 mg Intravenous STAT Ivin Poot, MD      . Derrill Memo ON 04/01/2016] metoprolol tartrate (LOPRESSOR) tablet 12.5 mg  12.5 mg Oral Once Ivin Poot, MD      . [name] Sou Hold] nitroGLYCERIN (NITROSTAT) SL tablet 0.4 mg  0.4 mg Sublingual Q5 min PRN Lajean Saver, MD   0.4 mg at 03/31/16 1455  . nitroGLYCERIN 50 mg in dextrose 5 % 250 mL (0.2 mg/mL) infusion  2-200 mcg/min Intravenous To OR Renee J Ackley, RPH      . phenylephrine (NEO-SYNEPHRINE) 20 mg in dextrose 5 % 250 mL (0.08 mg/mL) infusion  30-200 mcg/min Intravenous To OR Shelda Altes, RPH      . potassium chloride injection 80 mEq  80 mEq Other To OR Renee J Ackley, RPH      . temazepam (RESTORIL) capsule 15 mg  15 mg Oral Once PRN Ivin Poot, MD      .  vancomycin (VANCOCIN) 1,250 mg in sodium chloride 0.9 % 250 mL IVPB  1,250 mg Intravenous To OR Shelda Altes, RPH        Prescriptions prior to admission  Medication Sig Dispense Refill Last Dose  . aspirin 325 MG EC tablet Take 325 mg by mouth daily.   03/31/2016 at Unknown time  . Fish Oil-Cholecalciferol (FISH OIL + D3 PO) Take 2 capsules by mouth daily.   03/31/2016 at Unknown time  . MAGNESIUM-POTASSIUM PO Take 1 tablet by mouth daily.   03/31/2016 at Unknown time  . Multiple Vitamin (MULTIVITAMIN) capsule Take 1 capsule by mouth daily.   03/31/2016 at Unknown time  . simvastatin (ZOCOR) 20 MG tablet Take 1 tablet by mouth daily.   03/31/2016 at Unknown time    Family History  Problem Relation Age of Onset  . Hypertension      SIblings  . CAD Mother     CABG     Review of Systems:      Cardiac Review of Systems: Y or N  Chest Pain [ Yes   ]  Resting SOB [  no ] Exertional SOB  [no  ]  Orthopnea [no  ]   Pedal Edema [no  ]    Palpitations [ yes ] Syncope  [ no ]   Presyncope [no  ]  General Review of Systems: [Y] = yes [  ]=no Constitional: recent weight change [  ]; anorexia [  ]; fatigue [  ]; nausea [  ]; night sweats [  ]; fever [  ]; or chills [  ]                                                               Dental: poor dentition[  ]; Last Dentist visit: Every 6 months  Eye : blurred vision [  ]; diplopia [   ]; vision changes [  ];  Amaurosis fugax[  ]; Resp: cough [  ];  wheezing[  ];  hemoptysis[  ]; shortness of breath[  ]; paroxysmal nocturnal dyspnea[  ]; dyspnea on exertion[  ]; or orthopnea[  ];  GI:  gallstones[  ], vomiting[  ];  dysphagia[  ]; melena[  ];  hematochezia [  ]; heartburn[  ];   Hx of  Colonoscopy[  ]; GU: kidney stones [  ]; hematuria[  ];   dysuria [  ];  nocturia[  ];  history of     obstruction [  ]; urinary frequency [  ]             Skin: rash, swelling[  ];, hair loss[  ];  peripheral edema[  ];  or itching[  ]; Musculosketetal: myalgias[  ];  joint  swelling[  ];  joint erythema[  ]; discomfort and left arm, poor left radial pulse  joint pain[  ];  back pain[  ];  Heme/Lymph: bruising[  ];  bleeding[  ];  anemia[  ];  Neuro: TIA[  ];  headaches[  ];  stroke[  ];  vertigo[  ];  seizures[  ];   paresthesias[  ];  difficulty walking[  ]; no peripheral motor weakness other  than slight weakness in left hand grip  Psych:depression[  ]; anxiety[  ];  Endocrine: diabetes[  ];  thyroid dysfunction[  ];  Immunizations: Flu [  ]; Pneumococcal[  ];  Other: Right-hand dominant  Physical Exam: BP 109/48 mmHg  Pulse 57  Temp(Src) 97.9 F (36.6 C) (Oral)  Resp 20  Ht 6\' 2"  (1.88 m)  Wt 285 lb (129.275 kg)  BMI 36.58 kg/m2  SpO2 90%      Physical Exam  General: Middle-aged obese Caucasian male anxious having chest pain and ED HEENT: Normocephalic pupils equal , dentition adequate Neck: Supple without JVD, adenopathy, or bruit Chest: Clear to auscultation, symmetrical breath sounds, no rhonchi, no tenderness             or deformity Cardiovascular: Regular rate and rhythm, no murmur, no gallop, peripheral pulses             palpable in all extremities except for left radial pulse Abdomen:  Soft, nontender, no palpable mass or organomegaly Extremities: Warm, well-perfused, no clubbing cyanosis edema or tenderness,              no venous stasis changes of the legs Rectal/GU: Deferred Neuro: Grossly non--focal and symmetrical throughout Skin: Clean and dry without rash or ulceration    Diagnostic Studies & Laboratory data:     Recent Radiology Findings:   Dg Chest 2 View  03/31/2016  CLINICAL DATA:  55 year old male with a history of chest pain EXAM: CHEST  2 VIEW COMPARISON:  None. FINDINGS: Cardiac diameter within normal limits. Tortuosity descending thoracic aorta. No evidence of central vascular congestion or interlobular septal thickening. No pleural effusion or pneumothorax.  No confluent airspace disease. No displaced fracture.  IMPRESSION: No radiographic evidence of acute cardiopulmonary disease. Signed, Dulcy Fanny. Earleen Newport, DO [facility] Radiology Electronically Signed   By: Corrie Mckusick D.O.   On: 03/31/2016 12:21   Ct Angio Chest Pe W Or Wo Contrast  03/31/2016  EXAM: CT ANGIOGRAPHY CHEST WITH CONTRAST TECHNIQUE: Multidetector CT imaging of the chest was performed using the standard protocol during bolus administration of intravenous contrast. Multiplanar CT image reconstructions and MIPs were obtained to evaluate the vascular anatomy. CONTRAST:  70 mL of Isovue-300. COMPARISON:  No priors. FINDINGS: Mediastinum/Lymph Nodes: There is irregularity of the thoracic aorta. Specifically, associated with the left wall and posterior wall of the aortic root extending into the ascending thoracic aorta to the level the distal arch there is some eccentric high attenuation material which distorts the normal aortic contours. This is strongly suggestive of acute intramural hemorrhage, although given the dilatation of the mid aortic arch, the possibility of concurrent aortic dissection is suspected. The proximal aspect of this process comes in direct contact with the ostium of the left main coronary artery, placing the patient at risk for acute devastating coronary ischemia. Notably, the high attenuation in the ascending thoracic aorta appears to traverse into the adjacent right lateral wall of the pulmonic trunk, indicative of spread of hemorrhage via a common shared adventitia between the vessels. Some of this hemorrhage also extends anteriorly into the middle mediastinum best appreciated on image 62 of series 4 where this currently appears contained. There is another small amount of high attenuation material located anterior to the aortic root on image 73 of series 4, also likely to represent a small amount of hemomediastinum. 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 Ascending aorta is also aneurysmal measuring 5.6 cm. As previously noted, mid arch is aneurysmal measuring 6.8 cm. Descending thoracic aorta is normal in caliber measuring 3.4 cm. There is also coronary artery atherosclerosis, including calcified atherosclerotic plaque in the left main, left anterior descending, left circumflex and right coronary arteries. There is a small amount of pericardial fluid, which appears low-attenuation. No frank hemopericardium is identified at this time. There are no filling defects within the pulmonary arterial tree to suggest underlying pulmonary embolism. Heart size is normal. No pathologically enlarged mediastinal or hilar lymph nodes. Esophagus is unremarkable in appearance. No axillary lymphadenopathy. Lungs/Pleura: No acute consolidative airspace disease. No pleural effusions. No suspicious appearing pulmonary nodules or masses are noted. Upper Abdomen: Colonic diverticulae are noted in the region of the splenic flexure of the colon. Musculoskeletal/Soft Tissues: There are no aggressive appearing lytic or blastic lesions noted in the visualized portions of the skeleton. Review of the MIP images confirms the above findings. IMPRESSION: 1. Findings are compatible with an acute aortic syndrome, as detailed above. The most concerning possibility is that of a type A thoracic aortic dissection with pending compromise of the left main coronary artery, and early acute hemorrhage into the middle mediastinum. No frank hemopericardium identified at this time. 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 These findings would be better evaluated with immediate CTA of the thorax, which is recommended at this time. Emergent cardiothoracic surgery consultation is also strongly recommended. 2. No evidence of pulmonary  embolism. These results were called by telephone at the time of interpretation on 03/31/2016 at 4:18 pm to PA [facility] Dr. Lajean Saver, who verbally acknowledged these results. Electronically Signed   By: Vinnie Langton M.D.   On: 03/31/2016 16:31   Ct Angio Chest/abd/pel For Dissection W And/or W/wo  03/31/2016  CLINICAL DATA:  55 year old male EXAM: CT CHEST, ABDOMEN, AND PELVIS WITH CONTRAST TECHNIQUE: Multidetector CT imaging of the chest, abdomen and pelvis was performed following the standard protocol during bolus administration of intravenous contrast. CONTRAST:  70 mL of Isovue 370 COMPARISON:  PE protocol chest CT 03/31/2016. FINDINGS: CTA CHEST Mediastinum/Lymph Nodes: As previously suspected there is a type A thoracic aortic dissection which originates at the level of the aortic root, likely involving the commissure between the left and non coronary cusps of the aortic valve (best appreciated on axial image 67 of series 4). The dissection flap impinges upon the ostium of the right coronary artery (best appreciated on axial image 59 of series 4). The true lumen gives rise to the left main coronary artery, although the dissection flap does come in very close proximity to the ostium of the vessel, best appreciated on axial images 49 and 50 of series 4. There is IV contrast in the left main, left anterior descending, left circumflex and right coronary arteries at this time, indicative of gross patency. 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 Ascending thoracic aorta is aneurysmal measuring 5.7 cm in diameter. Mid aortic arch is aneurysmal measuring 6.8 cm in diameter. The dissection flap extends into the ostium and proximal aspect of the left subclavian artery. The great vessels of the mediastinum are incompletely visualized secondary to incomplete thoracic coverage, however, the visualized portions of the proximal  brachiocephalic artery and left common carotid artery do not appear  to be involved with the dissection at this time. The dissection extends into the pelvis where it appears to terminate at the level of the aortic bifurcation, as detailed below. As previously described, there is some high attenuation material located between the aortic root and proximal ascending thoracic aorta, and the adjacent pulmonic trunk, indicative of some acute hemorrhage into the shared adventitia between the 2 vessels. Some of this hemorrhage extends anteriorly into the middle mediastinum, best appreciated on axial image 43 of series 4 and axial image 54 of series 4), where it appears to be within the epicardial fat. There is a small amount of low-attenuation pericardial fluid, but no frank hemopericardium at this time. Heart size is normal. Atherosclerotic calcifications are noted in the left main, left anterior descending, left circumflex and right coronary arteries. Esophagus is normal in appearance. No axillary lymphadenopathy. Lungs/Pleura: No acute consolidative airspace disease. No pleural effusions. No pneumothorax. No suspicious appearing pulmonary nodules or masses. Musculoskeletal/Soft Tissues: There are no aggressive appearing lytic or blastic lesions noted in the visualized portions of the skeleton. CTA ABDOMEN AND PELVIS Vascular: The aortic dissection extends into the pelvis and appears to terminate at the level of the aortic bifurcation, although accurate assessment is hindered by suboptimal contrast bolus in the pelvis and possible extension into the left common iliac artery is not excluded. In the abdomen, the true lumen gives rise to the celiac axis, superior mesenteric artery, inferior mesenteric artery and the right renal artery. The left renal artery is fed partially by the true lumen, however, the dissection flap appears to extend into the proximal aspect of the vessel, with the majority of flow being distributed by  the false lumen. There appear to be single renal arteries bilaterally. As previously mentioned, the dissection flap appears to terminate at the level of the bifurcation, with the true lumen giving rise to the right common iliac artery in the false lumen given rise to the left common iliac artery. 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 Hepatobiliary: No cystic or solid hepatic lesions. No intra or extrahepatic biliary ductal dilatation. Gallbladder is normal in appearance. Pancreas: No pancreatic mass. No pancreatic ductal dilatation. No pancreatic or peripancreatic fluid or inflammatory changes. Spleen: Unremarkable. Adrenals/Urinary Tract: Slight differential perfusion of the kidneys is noted, likely reflective of differential flow to both kidneys (with higher attenuation flow into the right kidney fed solely by the true lumen and relatively lower attenuation flow to the left kidney which is fed predominantly by the false lumen). Findings do not favor frank renal infarction at this time. There is a small wedge-shaped parenchymal defect in the upper pole the right kidney, presumably scar from prior renal infarction or prior infection. No definite suspicious renal lesions. No hydroureteronephrosis in the visualized portions of the abdomen or pelvis. Urinary bladder is not visualized. Bilateral adrenal glands are normal in appearance. Stomach/Bowel: The stomach is normal in appearance. No pathologic dilatation of visualized portions of the small bowel or colon. Mild colonic diverticulosis is noted, without evidence of surrounding inflammation to indicate acute diverticulitis at this time. Lymphatic: No lymphadenopathy noted in the abdomen or visualized portions of the upper pelvis. Reproductive: Incompletely visualized secondary to technologist error. Other: No significant volume of ascites and no  pneumoperitoneum in the visualized portions of the peritoneal cavity. Musculoskeletal: There are no aggressive appearing lytic or blastic lesions noted in the visualized portions of  the skeleton. IMPRESSION: 1. Type A thoracic aortic dissection confirmed, with extension into the pelvis apparently terminating at the level of the aortic bifurcation. The dissection flap originates at the level of the aortic root with possible compromise of the aortic valve which could place the patient at risk for acute aortic regurgitation, and impingement upon the ostium of the right coronary artery, and to a lesser degree the left coronary artery ostium. The dissection flap extends into the proximal left subclavian artery, and also extends into the left renal artery. 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 As previously indicated, emergent Cardiothoracic Surgery consultation is strongly recommended. 2. Additional incidental findings, as above. These results were called by telephone at the time of interpretation on 03/31/2016 at 5:05 pm to Dr. Rhodia Albright, who verbally acknowledged these results. Electronically Signed   By: Vinnie Langton M.D.   On: 03/31/2016 17:13     I have independently reviewed the above radiologic studies.  Recent Lab Findings: Lab Results  Component Value Date   WBC 6.1 03/31/2016   HGB 15.0 03/31/2016   HCT 42.7 03/31/2016   PLT 211 03/31/2016   GLUCOSE 115* 03/31/2016   NA 139 03/31/2016   K 4.4 03/31/2016   CL 107 03/31/2016   CREATININE 1.33* 03/31/2016   BUN 14 03/31/2016   CO2 24 03/31/2016   INR 1.12 03/31/2016      Assessment / Plan:      55 year old obese male with a history of hypertension and smoking presents with acute onset of chest pain and diagnosed by CTA as having type A aortic dissection with a tear originating close to the coronary ostia. He is currently hemodynamically stable and coherent. He has a poor left radial pulse  with some weakness in the left hand grip.  I have recommended emergency repair of his type A dissection with possible aortic root replacement using a mechanical valve if needed. He understands the urgency of the situation and the high mortality associated with uncorrected acute type A aortic dissection. He understands the risks of the surgery including the risk of stroke, bleeding, blood transfusion,, MI, postoperative pulmonary problems including pleural effusions and ventilator dependence, infection, and death. He agrees to proceed with surgery for emergency repair of aortic dissection.      @ME1 @ 03/31/2016 5:39 PM

## 2016-03-31 NOTE — ED Notes (Signed)
Patient transported to CT 

## 2016-03-31 NOTE — Progress Notes (Signed)
Patient reports having ~ $50 and cell phone. Patient does not want this taken to security. Bag taken to PACU by CRNA

## 2016-03-31 NOTE — ED Provider Notes (Addendum)
CSN: [id number]     Arrival date & time 03/31/16  1131 History   First MD Initiated Contact with Patient 03/31/16 1228     Chief Complaint  Patient presents with  . Chest Pain     (Consider location/radiation/quality/duration/timing/severity/associated sxs/prior Treatment) Patient is a 55 y.o. male presenting with chest pain. The history is provided by the patient.  Chest Pain Associated symptoms: no abdominal pain, no back pain, no cough, no fever, no headache, no shortness of breath and not vomiting   Patient c/o mid chest pain that started after doing some bicep curl exercises.  Pain initially sharp, but now constant. Also c/o ache in left arm. Mild sob. Nausea, no vomiting. +sweats. No hx same. No hx cad. Non smoker. Mom had cabg in her 45's.  No hx prior stress test.  No heartburn/gerd.   No back or flank pain. No cough or fevers. No leg pain or swelling.          Past Medical History  Diagnosis Date  . Hypertension   . High cholesterol    History reviewed. No pertinent past surgical history. No family history on file. Social History  Substance Use Topics  . Smoking status: Never Smoker   . Smokeless tobacco: None  . Alcohol Use: None    Review of Systems  Constitutional: Negative for fever and chills.  HENT: Negative for sore throat.   Eyes: Negative for redness.  Respiratory: Negative for cough and shortness of breath.   Cardiovascular: Positive for chest pain.  Gastrointestinal: Negative for vomiting, abdominal pain and diarrhea.  Genitourinary: Negative for flank pain.  Musculoskeletal: Negative for back pain and neck pain.  Skin: Negative for rash.  Neurological: Negative for headaches.  Hematological: Does not bruise/bleed easily.  Psychiatric/Behavioral: Negative for confusion.      Allergies  Review of patient's allergies indicates no known allergies.  Home Medications   Prior to Admission medications   Not on File   BP 135/79 mmHg  Pulse 84   Temp(Src) 97.9 F (36.6 C) (Oral)  Resp 20  Ht 6\' 2"  (1.88 m)  Wt 129.275 kg  BMI 36.58 kg/m2  SpO2 96% Physical Exam  Constitutional: He is oriented to person, place, and time. He appears well-developed and well-nourished. No distress.  HENT:  Mouth/Throat: Oropharynx is clear and moist.  Eyes: Conjunctivae are normal. No scleral icterus.  Neck: Neck supple. No tracheal deviation present.  Cardiovascular: Normal rate, regular rhythm, normal heart sounds and intact distal pulses.   Pulmonary/Chest: Effort normal and breath sounds normal. No accessory muscle usage. No respiratory distress.  Abdominal: Soft. Bowel sounds are normal. He exhibits no distension. There is no tenderness.  Musculoskeletal: Normal range of motion. He exhibits no edema or tenderness.  Neurological: He is alert and oriented to person, place, and time.  Skin: Skin is warm and dry. No rash noted.  Psychiatric:  Mildly anxious.   Nursing note and vitals reviewed.   ED Course  Procedures (including critical care time) Labs Review    Results for orders placed or performed during the hospital encounter of [id number]  Basic metabolic panel  Result Value Ref Range   Sodium 139 135 - 145 mmol/L   Potassium 4.4 3.5 - 5.1 mmol/L   Chloride 107 101 - 111 mmol/L   CO2 24 22 - 32 mmol/L   Glucose, Bld 115 (H) 65 - 99 mg/dL   BUN 14 6 - 20 mg/dL   Creatinine, Ser 1.33 (H) 0.61 -  1.24 mg/dL   Calcium 9.6 8.9 - 10.3 mg/dL   GFR calc non Af Amer 59 (L) >60 mL/min   GFR calc Af Amer >60 >60 mL/min   Anion gap 8 5 - 15  CBC  Result Value Ref Range   WBC 6.1 4.0 - 10.5 K/uL   RBC 4.58 4.22 - 5.81 MIL/uL   Hemoglobin 15.0 13.0 - 17.0 g/dL   HCT 42.7 39.0 - 52.0 %   MCV 93.2 78.0 - 100.0 fL   MCH 32.8 26.0 - 34.0 pg   MCHC 35.1 30.0 - 36.0 g/dL   RDW 13.3 11.5 - 15.5 %   Platelets 211 150 - 400 K/uL  I-stat troponin, ED  Result Value Ref Range   Troponin i, poc 0.02 0.00 - 0.08 ng/mL   Comment 3           I-stat troponin, ED  Result Value Ref Range   Troponin i, poc 0.09 (HH) 0.00 - 0.08 ng/mL   Comment NOTIFIED PHYSICIAN    Comment 3           Dg Chest 2 View  03/31/2016  CLINICAL DATA:  55 year old male with a history of chest pain EXAM: CHEST  2 VIEW COMPARISON:  None. FINDINGS: Cardiac diameter within normal limits. Tortuosity descending thoracic aorta. No evidence of central vascular congestion or interlobular septal thickening. No pleural effusion or pneumothorax.  No confluent airspace disease. No displaced fracture. IMPRESSION: No radiographic evidence of acute cardiopulmonary disease. Signed, Dulcy Fanny. Earleen Newport, DO [facility] Electronically Signed   By: Corrie Mckusick D.O.   On: 03/31/2016 12:21       I have personally reviewed and evaluated these images and lab results as part of my medical decision-making.   EKG Interpretation   Date/Time:  Saturday March 31 2016 11:38:48 EDT Ventricular Rate:  84 PR Interval:  170 QRS Duration: 92 QT Interval:  420 QTC Calculation: 496 R Axis:   82 Text Interpretation:  Normal sinus rhythm Nonspecific T wave abnormality  No previous tracing Confirmed by Ashok Cordia  MD, Lennette Bihari ([id number]) on 03/31/2016  12:27:57 PM      MDM   Iv ns. Cxr.   Reviewed nursing notes and prior charts for additional history.   Pt indicates had asa at home, will give full stre chewable asa in ED.   Discussed admission w pt, pt indicates he does not want to stay if at all possible. Will repeat trop.   Gi meds also tried for symptom relief.  Initial trop neg.   ecg with t inv v2, with no old ecg to compare.  ?very slight < 1/2 mm st elev inf.  Will repeat ecg.  Morphine iv. Ntg.   Repeat ecg similar to initial.  Repeat trop returned to me at 1450, cardiology consulted re increasing trop, persistent cp, abn ecg - Dr Renaee Munda will see in ED and decide on cath lab.   ddimer returns very high.  Patient on  recheck somewhat restless, trouble sitting in one position.  ?concern for dissection, also r/o PE.  Will get CTA.  Dr Recardo Evangelist indicates to get CTA first, then he will decide on cath/further management.   CRITICAL CARE  RE  chest pain, elevated trop, aortic dissection.  Performed by: Mirna Mires Total critical care time: 35 minutes Critical care time was exclusive of separately billable procedures and treating other patients. Critical care was necessary to treat or prevent imminent or life-threatening deterioration. Critical  care was time spent personally by me on the following activities: development of treatment plan with patient and/or surrogate as well as nursing, discussions with consultants, evaluation of patient's response to treatment, examination of patient, obtaining history from patient or surrogate, ordering and performing treatments and interventions, ordering and review of laboratory studies, ordering and review of radiographic studies, pulse oximetry and re-evaluation of patient's condition.  CTA w concern dissection, radiology rec additional contrast/additional images - pt still on table. Recheck pt, comfortable. bp improved, pain improved - updated on results. CVTS consulted. Additional labs ordered. Esmolol gtt.   1630 discussed with cvts, Dr Nils Pyle - he will see in ED.    Lajean Saver, MD 03/31/16 [phone]

## 2016-03-31 NOTE — Progress Notes (Signed)
  Echocardiogram Echocardiogram Transesophageal has been performed.  Jon Rodriguez M 03/31/2016, 7:21 PM

## 2016-03-31 NOTE — Progress Notes (Signed)
  Echocardiogram 2D Echocardiogram Limited has been performed.  Jon Rodriguez M 03/31/2016, 5:26 PM

## 2016-04-01 ENCOUNTER — Inpatient Hospital Stay (HOSPITAL_COMMUNITY): Payer: 59

## 2016-04-01 DIAGNOSIS — I7101 Dissection of thoracic aorta: Secondary | ICD-10-CM | POA: Diagnosis present

## 2016-04-01 DIAGNOSIS — I71019 Dissection of thoracic aorta, unspecified: Secondary | ICD-10-CM | POA: Diagnosis present

## 2016-04-01 LAB — POCT I-STAT 3, ART BLOOD GAS (G3+)
Acid-Base Excess: 1 mmol/L (ref 0.0–2.0)
Acid-Base Excess: 2 mmol/L (ref 0.0–2.0)
Acid-Base Excess: 1 mmol/L (ref 0.0–2.0)
Acid-base deficit: 5 mmol/L — ABNORMAL HIGH (ref 0.0–2.0)
Acid-base deficit: 5 mmol/L — ABNORMAL HIGH (ref 0.0–2.0)
Acid-base deficit: 6 mmol/L — ABNORMAL HIGH (ref 0.0–2.0)
Acid-base deficit: 3 mmol/L — ABNORMAL HIGH (ref 0.0–2.0)
Acid-base deficit: 3 mmol/L — ABNORMAL HIGH (ref 0.0–2.0)
Acid-base deficit: 6 mmol/L — ABNORMAL HIGH (ref 0.0–2.0)
Acid-base deficit: 2 mmol/L (ref 0.0–2.0)
Bicarbonate: 21.6 mEq/L (ref 20.0–24.0)
Bicarbonate: 21.9 mEq/L (ref 20.0–24.0)
Bicarbonate: 24.3 mEq/L — ABNORMAL HIGH (ref 20.0–24.0)
Bicarbonate: 25.6 mEq/L — ABNORMAL HIGH (ref 20.0–24.0)
Bicarbonate: 22.3 mEq/L (ref 20.0–24.0)
Bicarbonate: 23.3 mEq/L (ref 20.0–24.0)
Bicarbonate: 19.2 mEq/L — ABNORMAL LOW (ref 20.0–24.0)
Bicarbonate: 23.3 mEq/L (ref 20.0–24.0)
Bicarbonate: 26.9 mEq/L — ABNORMAL HIGH (ref 20.0–24.0)
Bicarbonate: 25.8 mEq/L — ABNORMAL HIGH (ref 20.0–24.0)
O2 Saturation: 100 %
O2 Saturation: 100 %
O2 Saturation: 100 %
O2 Saturation: 99 %
O2 Saturation: 93 %
O2 Saturation: 94 %
O2 Saturation: 93 %
O2 Saturation: 93 %
O2 Saturation: 94 %
O2 Saturation: 94 %
Patient temperature: 35.9
Patient temperature: 36
Patient temperature: 36
Patient temperature: 36.6
Patient temperature: 36.8
Patient temperature: 36.9
TCO2: 23 mmol/L (ref 0–100)
TCO2: 23 mmol/L (ref 0–100)
TCO2: 27 mmol/L (ref 0–100)
TCO2: 27 mmol/L (ref 0–100)
TCO2: 23 mmol/L (ref 0–100)
TCO2: 25 mmol/L (ref 0–100)
TCO2: 20 mmol/L (ref 0–100)
TCO2: 25 mmol/L (ref 0–100)
TCO2: 28 mmol/L (ref 0–100)
TCO2: 27 mmol/L (ref 0–100)
pCO2 arterial: 42 mmHg (ref 35.0–45.0)
pCO2 arterial: 45.6 mmHg — ABNORMAL HIGH (ref 35.0–45.0)
pCO2 arterial: 48.2 mmHg — ABNORMAL HIGH (ref 35.0–45.0)
pCO2 arterial: 84.8 mmHg (ref 35.0–45.0)
pCO2 arterial: 36.9 mmHg (ref 35.0–45.0)
pCO2 arterial: 42 mmHg (ref 35.0–45.0)
pCO2 arterial: 35.9 mmHg (ref 35.0–45.0)
pCO2 arterial: 41 mmHg (ref 35.0–45.0)
pCO2 arterial: 42.9 mmHg (ref 35.0–45.0)
pCO2 arterial: 39.9 mmHg (ref 35.0–45.0)
pH, Arterial: 7.064 — CL (ref 7.350–7.450)
pH, Arterial: 7.266 — ABNORMAL LOW (ref 7.350–7.450)
pH, Arterial: 7.283 — ABNORMAL LOW (ref 7.350–7.450)
pH, Arterial: 7.392 (ref 7.350–7.450)
pH, Arterial: 7.347 — ABNORMAL LOW (ref 7.350–7.450)
pH, Arterial: 7.385 (ref 7.350–7.450)
pH, Arterial: 7.331 — ABNORMAL LOW (ref 7.350–7.450)
pH, Arterial: 7.36 (ref 7.350–7.450)
pH, Arterial: 7.405 (ref 7.350–7.450)
pH, Arterial: 7.418 (ref 7.350–7.450)
pO2, Arterial: 159 mmHg — ABNORMAL HIGH (ref 80.0–100.0)
pO2, Arterial: 252 mmHg — ABNORMAL HIGH (ref 80.0–100.0)
pO2, Arterial: 354 mmHg — ABNORMAL HIGH (ref 80.0–100.0)
pO2, Arterial: 357 mmHg — ABNORMAL HIGH (ref 80.0–100.0)
pO2, Arterial: 63 mmHg — ABNORMAL LOW (ref 80.0–100.0)
pO2, Arterial: 71 mmHg — ABNORMAL LOW (ref 80.0–100.0)
pO2, Arterial: 69 mmHg — ABNORMAL LOW (ref 80.0–100.0)
pO2, Arterial: 67 mmHg — ABNORMAL LOW (ref 80.0–100.0)
pO2, Arterial: 69 mmHg — ABNORMAL LOW (ref 80.0–100.0)
pO2, Arterial: 71 mmHg — ABNORMAL LOW (ref 80.0–100.0)

## 2016-04-01 LAB — BASIC METABOLIC PANEL
Anion gap: 9 (ref 5–15)
BUN: 16 mg/dL (ref 6–20)
CO2: 22 mmol/L (ref 22–32)
Calcium: 7.8 mg/dL — ABNORMAL LOW (ref 8.9–10.3)
Chloride: 110 mmol/L (ref 101–111)
Creatinine, Ser: 1.43 mg/dL — ABNORMAL HIGH (ref 0.61–1.24)
GFR calc Af Amer: >60 mL/min (ref >60)
GFR calc non Af Amer: 54 mL/min — ABNORMAL LOW (ref >60)
Glucose, Bld: 227 mg/dL — ABNORMAL HIGH (ref 65–99)
Potassium: 3.6 mmol/L (ref 3.5–5.1)
Sodium: 141 mmol/L (ref 135–145)

## 2016-04-01 LAB — POCT I-STAT, CHEM 8
BUN: 14 mg/dL (ref 6–20)
BUN: 18 mg/dL (ref 6–20)
BUN: 18 mg/dL (ref 6–20)
BUN: 19 mg/dL (ref 6–20)
BUN: 19 mg/dL (ref 6–20)
BUN: 20 mg/dL (ref 6–20)
BUN: 19 mg/dL (ref 6–20)
Calcium, Ion: 0.76 mmol/L — ABNORMAL LOW (ref 1.13–1.30)
Calcium, Ion: 0.92 mmol/L — ABNORMAL LOW (ref 1.13–1.30)
Calcium, Ion: 0.92 mmol/L — ABNORMAL LOW (ref 1.13–1.30)
Calcium, Ion: 1.07 mmol/L — ABNORMAL LOW (ref 1.13–1.30)
Calcium, Ion: 1.25 mmol/L (ref 1.13–1.30)
Calcium, Ion: 1.26 mmol/L (ref 1.13–1.30)
Calcium, Ion: 1.13 mmol/L (ref 1.13–1.30)
Chloride: 102 mmol/L (ref 101–111)
Chloride: 103 mmol/L (ref 101–111)
Chloride: 104 mmol/L (ref 101–111)
Chloride: 106 mmol/L (ref 101–111)
Chloride: 106 mmol/L (ref 101–111)
Chloride: 99 mmol/L — ABNORMAL LOW (ref 101–111)
Chloride: 106 mmol/L (ref 101–111)
Creatinine, Ser: 0.7 mg/dL (ref 0.61–1.24)
Creatinine, Ser: 1 mg/dL (ref 0.61–1.24)
Creatinine, Ser: 1.1 mg/dL (ref 0.61–1.24)
Creatinine, Ser: 1.1 mg/dL (ref 0.61–1.24)
Creatinine, Ser: 1.1 mg/dL (ref 0.61–1.24)
Creatinine, Ser: 1.2 mg/dL (ref 0.61–1.24)
Creatinine, Ser: 1.3 mg/dL — ABNORMAL HIGH (ref 0.61–1.24)
Glucose, Bld: 109 mg/dL — ABNORMAL HIGH (ref 65–99)
Glucose, Bld: 110 mg/dL — ABNORMAL HIGH (ref 65–99)
Glucose, Bld: 133 mg/dL — ABNORMAL HIGH (ref 65–99)
Glucose, Bld: 135 mg/dL — ABNORMAL HIGH (ref 65–99)
Glucose, Bld: 207 mg/dL — ABNORMAL HIGH (ref 65–99)
Glucose, Bld: 96 mg/dL (ref 65–99)
Glucose, Bld: 214 mg/dL — ABNORMAL HIGH (ref 65–99)
HCT: 24 % — ABNORMAL LOW (ref 39.0–52.0)
HCT: 25 % — ABNORMAL LOW (ref 39.0–52.0)
HCT: 26 % — ABNORMAL LOW (ref 39.0–52.0)
HCT: 26 % — ABNORMAL LOW (ref 39.0–52.0)
HCT: 36 % — ABNORMAL LOW (ref 39.0–52.0)
HCT: 37 % — ABNORMAL LOW (ref 39.0–52.0)
HCT: 27 % — ABNORMAL LOW (ref 39.0–52.0)
Hemoglobin: 12.2 g/dL — ABNORMAL LOW (ref 13.0–17.0)
Hemoglobin: 12.6 g/dL — ABNORMAL LOW (ref 13.0–17.0)
Hemoglobin: 8.2 g/dL — ABNORMAL LOW (ref 13.0–17.0)
Hemoglobin: 8.5 g/dL — ABNORMAL LOW (ref 13.0–17.0)
Hemoglobin: 8.8 g/dL — ABNORMAL LOW (ref 13.0–17.0)
Hemoglobin: 8.8 g/dL — ABNORMAL LOW (ref 13.0–17.0)
Hemoglobin: 9.2 g/dL — ABNORMAL LOW (ref 13.0–17.0)
Potassium: 3.9 mmol/L (ref 3.5–5.1)
Potassium: 4.2 mmol/L (ref 3.5–5.1)
Potassium: 4.7 mmol/L (ref 3.5–5.1)
Potassium: 5.1 mmol/L (ref 3.5–5.1)
Potassium: 5.3 mmol/L — ABNORMAL HIGH (ref 3.5–5.1)
Potassium: 5.5 mmol/L — ABNORMAL HIGH (ref 3.5–5.1)
Potassium: 3.6 mmol/L (ref 3.5–5.1)
Sodium: 136 mmol/L (ref 135–145)
Sodium: 138 mmol/L (ref 135–145)
Sodium: 139 mmol/L (ref 135–145)
Sodium: 140 mmol/L (ref 135–145)
Sodium: 141 mmol/L (ref 135–145)
Sodium: 143 mmol/L (ref 135–145)
Sodium: 142 mmol/L (ref 135–145)
TCO2: 22 mmol/L (ref 0–100)
TCO2: 24 mmol/L (ref 0–100)
TCO2: 24 mmol/L (ref 0–100)
TCO2: 24 mmol/L (ref 0–100)
TCO2: 27 mmol/L (ref 0–100)
TCO2: 27 mmol/L (ref 0–100)
TCO2: 20 mmol/L (ref 0–100)

## 2016-04-01 LAB — COMPREHENSIVE METABOLIC PANEL
ALT: 222 U/L — ABNORMAL HIGH (ref 17–63)
AST: 237 U/L — ABNORMAL HIGH (ref 15–41)
Albumin: 2.8 g/dL — ABNORMAL LOW (ref 3.5–5.0)
Alkaline Phosphatase: 34 U/L — ABNORMAL LOW (ref 38–126)
Anion gap: 3 — ABNORMAL LOW (ref 5–15)
BUN: 19 mg/dL (ref 6–20)
CO2: 27 mmol/L (ref 22–32)
Calcium: 8.1 mg/dL — ABNORMAL LOW (ref 8.9–10.3)
Chloride: 112 mmol/L — ABNORMAL HIGH (ref 101–111)
Creatinine, Ser: 1.53 mg/dL — ABNORMAL HIGH (ref 0.61–1.24)
GFR calc Af Amer: 58 mL/min — ABNORMAL LOW (ref >60)
GFR calc non Af Amer: 50 mL/min — ABNORMAL LOW (ref >60)
Glucose, Bld: 84 mg/dL (ref 65–99)
Potassium: 3.9 mmol/L (ref 3.5–5.1)
Sodium: 142 mmol/L (ref 135–145)
Total Bilirubin: 1.3 mg/dL — ABNORMAL HIGH (ref 0.3–1.2)
Total Protein: 4 g/dL — ABNORMAL LOW (ref 6.5–8.1)

## 2016-04-01 LAB — CBC
HCT: 30.4 % — ABNORMAL LOW (ref 39.0–52.0)
HCT: 28.9 % — ABNORMAL LOW (ref 39.0–52.0)
HCT: 24.2 % — ABNORMAL LOW (ref 39.0–52.0)
Hemoglobin: 10.5 g/dL — ABNORMAL LOW (ref 13.0–17.0)
Hemoglobin: 9.9 g/dL — ABNORMAL LOW (ref 13.0–17.0)
Hemoglobin: 8.5 g/dL — ABNORMAL LOW (ref 13.0–17.0)
MCH: 32.6 pg (ref 26.0–34.0)
MCH: 32.1 pg (ref 26.0–34.0)
MCH: 32.4 pg (ref 26.0–34.0)
MCHC: 34.5 g/dL (ref 30.0–36.0)
MCHC: 34.3 g/dL (ref 30.0–36.0)
MCHC: 35.1 g/dL (ref 30.0–36.0)
MCV: 94.4 fL (ref 78.0–100.0)
MCV: 93.8 fL (ref 78.0–100.0)
MCV: 92.4 fL (ref 78.0–100.0)
Platelets: 106 10*3/uL — ABNORMAL LOW (ref 150–400)
Platelets: 96 10*3/uL — ABNORMAL LOW (ref 150–400)
Platelets: 66 10*3/uL — ABNORMAL LOW (ref 150–400)
RBC: 3.22 MIL/uL — ABNORMAL LOW (ref 4.22–5.81)
RBC: 3.08 MIL/uL — ABNORMAL LOW (ref 4.22–5.81)
RBC: 2.62 MIL/uL — ABNORMAL LOW (ref 4.22–5.81)
RDW: 13.5 % (ref 11.5–15.5)
RDW: 13.9 % (ref 11.5–15.5)
RDW: 13.9 % (ref 11.5–15.5)
WBC: 14.2 10*3/uL — ABNORMAL HIGH (ref 4.0–10.5)
WBC: 14.8 10*3/uL — ABNORMAL HIGH (ref 4.0–10.5)
WBC: 11.4 10*3/uL — ABNORMAL HIGH (ref 4.0–10.5)

## 2016-04-01 LAB — GLUCOSE, CAPILLARY
Glucose-Capillary: 249 mg/dL — ABNORMAL HIGH (ref 65–99)
Glucose-Capillary: 234 mg/dL — ABNORMAL HIGH (ref 65–99)
Glucose-Capillary: 234 mg/dL — ABNORMAL HIGH (ref 65–99)
Glucose-Capillary: 251 mg/dL — ABNORMAL HIGH (ref 65–99)
Glucose-Capillary: 215 mg/dL — ABNORMAL HIGH (ref 65–99)
Glucose-Capillary: 219 mg/dL — ABNORMAL HIGH (ref 65–99)
Glucose-Capillary: 172 mg/dL — ABNORMAL HIGH (ref 65–99)
Glucose-Capillary: 145 mg/dL — ABNORMAL HIGH (ref 65–99)
Glucose-Capillary: 150 mg/dL — ABNORMAL HIGH (ref 65–99)
Glucose-Capillary: 119 mg/dL — ABNORMAL HIGH (ref 65–99)
Glucose-Capillary: 84 mg/dL (ref 65–99)
Glucose-Capillary: 93 mg/dL (ref 65–99)
Glucose-Capillary: 135 mg/dL — ABNORMAL HIGH (ref 65–99)
Glucose-Capillary: 132 mg/dL — ABNORMAL HIGH (ref 65–99)

## 2016-04-01 LAB — PROTIME-INR
INR: 1.13 (ref 0.00–1.49)
INR: 1.18 (ref 0.00–1.49)
Prothrombin Time: 14.7 seconds (ref 11.6–15.2)
Prothrombin Time: 15.2 seconds (ref 11.6–15.2)

## 2016-04-01 LAB — POCT I-STAT 4, (NA,K, GLUC, HGB,HCT)
Glucose, Bld: 218 mg/dL — ABNORMAL HIGH (ref 65–99)
HCT: 28 % — ABNORMAL LOW (ref 39.0–52.0)
Hemoglobin: 9.5 g/dL — ABNORMAL LOW (ref 13.0–17.0)
Potassium: 3.6 mmol/L (ref 3.5–5.1)
Sodium: 143 mmol/L (ref 135–145)

## 2016-04-01 LAB — PLATELET COUNT: Platelets: 49 10*3/uL — ABNORMAL LOW (ref 150–400)

## 2016-04-01 LAB — MAGNESIUM
Magnesium: 2 mg/dL (ref 1.7–2.4)
Magnesium: 2.9 mg/dL — ABNORMAL HIGH (ref 1.7–2.4)

## 2016-04-01 LAB — HEMOGLOBIN AND HEMATOCRIT, BLOOD
HCT: 24.8 % — ABNORMAL LOW (ref 39.0–52.0)
Hemoglobin: 8.5 g/dL — ABNORMAL LOW (ref 13.0–17.0)

## 2016-04-01 LAB — CREATININE, SERUM
Creatinine, Ser: 1.73 mg/dL — ABNORMAL HIGH (ref 0.61–1.24)
GFR calc Af Amer: 50 mL/min — ABNORMAL LOW (ref >60)
GFR calc non Af Amer: 43 mL/min — ABNORMAL LOW (ref >60)

## 2016-04-01 LAB — FIBRINOGEN: Fibrinogen: 165 mg/dL — ABNORMAL LOW (ref 204–475)

## 2016-04-01 LAB — APTT: aPTT: 53 seconds — ABNORMAL HIGH (ref 24–37)

## 2016-04-01 LAB — MRSA PCR SCREENING: MRSA by PCR: NEGATIVE

## 2016-04-01 MED ORDER — LEVALBUTEROL HCL 1.25 MG/0.5ML IN NEBU
1.2500 mg | INHALATION_SOLUTION | Freq: Four times a day (QID) | RESPIRATORY_TRACT | Status: DC
Start: 2016-04-01 — End: 2016-04-02
  Administered 2016-04-01 – 2016-04-02 (×5): 1.25 mg via RESPIRATORY_TRACT
  Filled 2016-04-01 (×5): qty 0.5

## 2016-04-01 MED ORDER — MAGNESIUM SULFATE 4 GM/100ML IV SOLN
4.0000 g | Freq: Once | INTRAVENOUS | Status: AC
  Administered 2016-04-01: 4 g via INTRAVENOUS
  Filled 2016-04-01: qty 100

## 2016-04-01 MED ORDER — SODIUM CHLORIDE 0.9% FLUSH: 3.0000 mL | INTRAVENOUS | Status: DC | PRN

## 2016-04-01 MED ORDER — FUROSEMIDE 10 MG/ML IJ SOLN: 40.0000 mg | Freq: Every day | INTRAMUSCULAR | Status: DC

## 2016-04-01 MED ORDER — SODIUM CHLORIDE 0.9% FLUSH
3.0000 mL | Freq: Two times a day (BID) | INTRAVENOUS | Status: DC
  Administered 2016-04-02 – 2016-04-07 (×7): 3 mL via INTRAVENOUS

## 2016-04-01 MED ORDER — TRAMADOL HCL 50 MG PO TABS: 50.0000 mg | ORAL_TABLET | ORAL | Status: DC | PRN

## 2016-04-01 MED ORDER — SODIUM CHLORIDE 0.9% FLUSH
3.0000 mL | INTRAVENOUS | Status: DC | PRN
Start: 2016-04-01 — End: 2016-04-09

## 2016-04-01 MED ORDER — SODIUM BICARBONATE 8.4 % IV SOLN
50.0000 meq | Freq: Once | INTRAVENOUS | Status: DC
  Filled 2016-04-01: qty 50

## 2016-04-01 MED ORDER — ANTISEPTIC ORAL RINSE SOLUTION (CORINZ)
7.0000 mL | OROMUCOSAL | Status: DC
  Administered 2016-04-01 – 2016-04-07 (×58): 7 mL via OROMUCOSAL

## 2016-04-01 MED ORDER — FUROSEMIDE 10 MG/ML IJ SOLN
20.0000 mg | Freq: Once | INTRAMUSCULAR | Status: AC
Start: 2016-04-01 — End: 2016-04-01
  Administered 2016-04-01: 20 mg via INTRAVENOUS
  Filled 2016-04-01: qty 2

## 2016-04-01 MED ORDER — METOPROLOL TARTRATE 12.5 MG HALF TABLET
12.5000 mg | ORAL_TABLET | Freq: Two times a day (BID) | ORAL | Status: DC
  Administered 2016-04-07: 12.5 mg via ORAL
  Filled 2016-04-01: qty 1

## 2016-04-01 MED ORDER — DEXMEDETOMIDINE HCL IN NACL 200 MCG/50ML IV SOLN
INTRAVENOUS | Status: AC
Start: 2016-04-01 — End: 2016-04-01
  Filled 2016-04-01: qty 50

## 2016-04-01 MED ORDER — HEMOSTATIC AGENTS (NO CHARGE) OPTIME
TOPICAL | Status: DC | PRN
  Administered 2016-04-01 (×2): 1 via TOPICAL

## 2016-04-01 MED ORDER — ACETAMINOPHEN 160 MG/5ML PO SOLN
650.0000 mg | Freq: Once | ORAL | Status: AC
  Administered 2016-04-01: 650 mg
  Filled 2016-04-01: qty 20.3

## 2016-04-01 MED ORDER — FUROSEMIDE 10 MG/ML IJ SOLN
INTRAMUSCULAR | Status: DC | PRN
  Administered 2016-04-01: 20 mg via INTRAMUSCULAR

## 2016-04-01 MED ORDER — FENTANYL BOLUS VIA INFUSION
50.0000 ug | INTRAVENOUS | Status: DC | PRN
  Administered 2016-04-02 (×3): 50 ug via INTRAVENOUS
  Filled 2016-04-01: qty 50

## 2016-04-01 MED ORDER — COAGULATION FACTOR VIIA RECOMB 1 MG IV SOLR
45.0000 ug/kg | Freq: Once | INTRAVENOUS | Status: AC
  Administered 2016-04-01: 6 mg via INTRAVENOUS
  Filled 2016-04-01: qty 6

## 2016-04-01 MED ORDER — ROCURONIUM BROMIDE 50 MG/5ML IV SOLN
INTRAVENOUS | Status: AC
  Filled 2016-04-01: qty 4

## 2016-04-01 MED ORDER — FENTANYL CITRATE (PF) 2500 MCG/50ML IJ SOLN
25.0000 ug/h | INTRAMUSCULAR | Status: DC
  Administered 2016-04-01: 50 ug/h via INTRAVENOUS
  Administered 2016-04-02: 250 ug/h via INTRAVENOUS
  Administered 2016-04-02: 150 ug/h via INTRAVENOUS
  Administered 2016-04-03: 350 ug/h via INTRAVENOUS
  Administered 2016-04-03: 400 ug/h via INTRAVENOUS
  Administered 2016-04-03: 350 ug/h via INTRAVENOUS
  Administered 2016-04-03 – 2016-04-04 (×2): 400 ug/h via INTRAVENOUS
  Administered 2016-04-04: 300 ug/h via INTRAVENOUS
  Administered 2016-04-05 (×2): 150 ug/h via INTRAVENOUS
  Administered 2016-04-06: 350 ug/h via INTRAVENOUS
  Filled 2016-04-01 (×13): qty 50

## 2016-04-01 MED ORDER — ALBUTEROL SULFATE HFA 108 (90 BASE) MCG/ACT IN AERS
INHALATION_SPRAY | RESPIRATORY_TRACT | Status: DC | PRN
  Administered 2016-04-01: 4 via RESPIRATORY_TRACT

## 2016-04-01 MED ORDER — POTASSIUM CHLORIDE 10 MEQ/50ML IV SOLN
10.0000 meq | INTRAVENOUS | Status: AC
  Administered 2016-04-01 (×3): 10 meq via INTRAVENOUS

## 2016-04-01 MED ORDER — SODIUM CHLORIDE 0.9 % IV SOLN: 250.0000 mL | INTRAVENOUS | Status: DC

## 2016-04-01 MED ORDER — CALCIUM CHLORIDE 10 % IV SOLN
INTRAVENOUS | Status: AC
  Filled 2016-04-01: qty 10

## 2016-04-01 MED ORDER — MORPHINE SULFATE (PF) 2 MG/ML IV SOLN
1.0000 mg | INTRAVENOUS | Status: AC | PRN
  Administered 2016-04-01: 4 mg via INTRAVENOUS

## 2016-04-01 MED ORDER — HEPARIN SODIUM (PORCINE) 1000 UNIT/ML IJ SOLN: INTRAMUSCULAR | Status: DC

## 2016-04-01 MED ORDER — ALBUTEROL SULFATE HFA 108 (90 BASE) MCG/ACT IN AERS
INHALATION_SPRAY | RESPIRATORY_TRACT | Status: AC
  Filled 2016-04-01: qty 6.7

## 2016-04-01 MED ORDER — SIMVASTATIN 20 MG PO TABS
20.0000 mg | ORAL_TABLET | Freq: Every day | ORAL | Status: DC
  Administered 2016-04-01 – 2016-04-02 (×2): 20 mg via ORAL
  Filled 2016-04-01 (×2): qty 1

## 2016-04-01 MED ORDER — SODIUM CHLORIDE 0.9% FLUSH
3.0000 mL | Freq: Two times a day (BID) | INTRAVENOUS | Status: DC
  Administered 2016-04-01 – 2016-04-08 (×10): 3 mL via INTRAVENOUS

## 2016-04-01 MED ORDER — FAMOTIDINE IN NACL 20-0.9 MG/50ML-% IV SOLN
20.0000 mg | Freq: Two times a day (BID) | INTRAVENOUS | Status: AC
  Administered 2016-04-01 (×2): 20 mg via INTRAVENOUS
  Filled 2016-04-01 (×2): qty 50

## 2016-04-01 MED ORDER — EPINEPHRINE HCL 1 MG/ML IJ SOLN: 0.0000 ug/min | INTRAVENOUS | Status: DC

## 2016-04-01 MED ORDER — FENTANYL CITRATE (PF) 100 MCG/2ML IJ SOLN
50.0000 ug | Freq: Once | INTRAMUSCULAR | Status: AC
  Administered 2016-04-01: 50 ug via INTRAVENOUS
  Filled 2016-04-01: qty 2

## 2016-04-01 MED ORDER — ACETAMINOPHEN 650 MG RE SUPP: 650.0000 mg | Freq: Once | RECTAL | Status: AC

## 2016-04-01 MED ORDER — LACTATED RINGERS IV SOLN: INTRAVENOUS | Status: DC

## 2016-04-01 MED ORDER — BISACODYL 5 MG PO TBEC
10.0000 mg | DELAYED_RELEASE_TABLET | Freq: Every day | ORAL | Status: DC
  Filled 2016-04-01: qty 2

## 2016-04-01 MED ORDER — DEXTROSE 5 % IV SOLN
0.0000 ug/min | INTRAVENOUS | Status: DC
  Administered 2016-04-01 – 2016-04-06 (×2): 3 ug/min via INTRAVENOUS
  Filled 2016-04-01 (×5): qty 4

## 2016-04-01 MED ORDER — PROTAMINE SULFATE 10 MG/ML IV SOLN
INTRAVENOUS | Status: AC
  Filled 2016-04-01: qty 25

## 2016-04-01 MED ORDER — ASPIRIN EC 325 MG PO TBEC
325.0000 mg | DELAYED_RELEASE_TABLET | Freq: Every day | ORAL | Status: DC
  Administered 2016-04-09 – 2016-04-16 (×8): 325 mg via ORAL
  Filled 2016-04-01 (×8): qty 1

## 2016-04-01 MED ORDER — PROTAMINE SULFATE 10 MG/ML IV SOLN
INTRAVENOUS | Status: DC | PRN
  Administered 2016-04-01: 10 mg via INTRAVENOUS
  Administered 2016-04-01: 340 mg via INTRAVENOUS

## 2016-04-01 MED ORDER — SODIUM CHLORIDE 0.9 % IV SOLN
INTRAVENOUS | Status: DC
  Administered 2016-04-01: 2.5 [IU]/h via INTRAVENOUS
  Administered 2016-04-02: 18:00:00 via INTRAVENOUS
  Filled 2016-04-01 (×3): qty 2.5

## 2016-04-01 MED ORDER — ONDANSETRON HCL 4 MG/2ML IJ SOLN
4.0000 mg | Freq: Four times a day (QID) | INTRAMUSCULAR | Status: DC | PRN
  Administered 2016-04-07: 4 mg via INTRAVENOUS
  Filled 2016-04-01: qty 2

## 2016-04-01 MED ORDER — ASPIRIN 81 MG PO CHEW
324.0000 mg | CHEWABLE_TABLET | Freq: Every day | ORAL | Status: DC
  Administered 2016-04-02 – 2016-04-06 (×4): 324 mg
  Filled 2016-04-01 (×4): qty 4

## 2016-04-01 MED ORDER — ACETAMINOPHEN 500 MG PO TABS: 1000.0000 mg | ORAL_TABLET | Freq: Four times a day (QID) | ORAL | Status: AC

## 2016-04-01 MED ORDER — NOREPINEPHRINE BITARTRATE 1 MG/ML IV SOLN
0.0000 ug/min | INTRAVENOUS | Status: DC
  Administered 2016-04-02: 10 ug/min via INTRAVENOUS
  Administered 2016-04-02: 2 ug/min via INTRAVENOUS
  Administered 2016-04-03: 6 ug/min via INTRAVENOUS
  Administered 2016-04-03: 10 ug/min via INTRAVENOUS
  Administered 2016-04-04: 4 ug/min via INTRAVENOUS
  Filled 2016-04-01 (×7): qty 4

## 2016-04-01 MED ORDER — CHLORHEXIDINE GLUCONATE 0.12% ORAL RINSE (MEDLINE KIT)
15.0000 mL | Freq: Two times a day (BID) | OROMUCOSAL | Status: DC
  Administered 2016-04-01 – 2016-04-07 (×13): 15 mL via OROMUCOSAL

## 2016-04-01 MED ORDER — MILRINONE LACTATE IN DEXTROSE 20-5 MG/100ML-% IV SOLN
INTRAVENOUS | Status: DC | PRN
  Administered 2016-04-01: .3 ug/kg/min via INTRAVENOUS

## 2016-04-01 MED ORDER — POTASSIUM CHLORIDE 10 MEQ/50ML IV SOLN
10.0000 meq | INTRAVENOUS | Status: AC
  Administered 2016-04-01 (×2): 10 meq via INTRAVENOUS
  Filled 2016-04-01 (×2): qty 50

## 2016-04-01 MED ORDER — SODIUM BICARBONATE 8.4 % IV SOLN
INTRAVENOUS | Status: AC
  Filled 2016-04-01: qty 50

## 2016-04-01 MED ORDER — OXYCODONE HCL 5 MG PO TABS: 5.0000 mg | ORAL_TABLET | ORAL | Status: DC | PRN

## 2016-04-01 MED ORDER — MILRINONE LACTATE IN DEXTROSE 20-5 MG/100ML-% IV SOLN
0.1250 ug/kg/min | INTRAVENOUS | Status: DC
  Administered 2016-04-01 – 2016-04-04 (×9): 0.3 ug/kg/min via INTRAVENOUS
  Administered 2016-04-04 – 2016-04-06 (×2): 0.125 ug/kg/min via INTRAVENOUS
  Filled 2016-04-01 (×14): qty 100

## 2016-04-01 MED ORDER — SODIUM CHLORIDE 0.9 % IV SOLN
INTRAVENOUS | Status: DC
  Administered 2016-04-01: 20 mL/h via INTRAVENOUS
  Administered 2016-04-03: 22:00:00 via INTRAVENOUS

## 2016-04-01 MED ORDER — PHENYLEPHRINE 40 MCG/ML (10ML) SYRINGE FOR IV PUSH (FOR BLOOD PRESSURE SUPPORT)
PREFILLED_SYRINGE | INTRAVENOUS | Status: AC
  Filled 2016-04-01: qty 10

## 2016-04-01 MED ORDER — BUDESONIDE 0.25 MG/2ML IN SUSP
0.2500 mg | Freq: Two times a day (BID) | RESPIRATORY_TRACT | Status: DC
  Administered 2016-04-01 – 2016-04-02 (×3): 0.25 mg via RESPIRATORY_TRACT
  Filled 2016-04-01 (×3): qty 2

## 2016-04-01 MED ORDER — INSULIN GLARGINE 100 UNIT/ML ~~LOC~~ SOLN
14.0000 [IU] | Freq: Two times a day (BID) | SUBCUTANEOUS | Status: DC
  Administered 2016-04-01 – 2016-04-02 (×4): 14 [IU] via SUBCUTANEOUS
  Filled 2016-04-01 (×6): qty 0.14

## 2016-04-01 MED ORDER — LIDOCAINE HCL (CARDIAC) 10 MG/ML IV SOLN: 100.0000 mg | Freq: Once | INTRAVENOUS | Status: DC

## 2016-04-01 MED ORDER — CHLORHEXIDINE GLUCONATE 0.12 % MT SOLN
15.0000 mL | OROMUCOSAL | Status: AC
  Administered 2016-04-01: 15 mL via OROMUCOSAL

## 2016-04-01 MED ORDER — FUROSEMIDE 10 MG/ML IJ SOLN
40.0000 mg | Freq: Once | INTRAMUSCULAR | Status: AC
  Administered 2016-04-01: 40 mg via INTRAVENOUS

## 2016-04-01 MED ORDER — MORPHINE SULFATE (PF) 2 MG/ML IV SOLN
2.0000 mg | INTRAVENOUS | Status: DC | PRN
  Administered 2016-04-01: 4 mg via INTRAVENOUS
  Filled 2016-04-01 (×2): qty 2

## 2016-04-01 MED ORDER — BISACODYL 10 MG RE SUPP
10.0000 mg | Freq: Every day | RECTAL | Status: DC
  Administered 2016-04-04 – 2016-04-06 (×2): 10 mg via RECTAL
  Filled 2016-04-01 (×2): qty 1

## 2016-04-01 MED ORDER — MILRINONE LACTATE IN DEXTROSE 20-5 MG/100ML-% IV SOLN
INTRAVENOUS | Status: AC
  Filled 2016-04-01: qty 100

## 2016-04-01 MED ORDER — INSULIN REGULAR BOLUS VIA INFUSION
0.0000 [IU] | Freq: Three times a day (TID) | INTRAVENOUS | Status: DC
  Filled 2016-04-01: qty 10

## 2016-04-01 MED ORDER — SODIUM CHLORIDE 0.9% FLUSH
10.0000 mL | Freq: Two times a day (BID) | INTRAVENOUS | Status: DC
  Administered 2016-04-01 – 2016-04-03 (×3): 10 mL via INTRAVENOUS
  Administered 2016-04-03: 3 mL via INTRAVENOUS
  Administered 2016-04-04 – 2016-04-07 (×4): 10 mL via INTRAVENOUS

## 2016-04-01 MED ORDER — VANCOMYCIN HCL IN DEXTROSE 1-5 GM/200ML-% IV SOLN
1000.0000 mg | Freq: Two times a day (BID) | INTRAVENOUS | Status: AC
  Administered 2016-04-01 – 2016-04-02 (×4): 1000 mg via INTRAVENOUS
  Filled 2016-04-01 (×4): qty 200

## 2016-04-01 MED ORDER — DOCUSATE SODIUM 100 MG PO CAPS
200.0000 mg | ORAL_CAPSULE | Freq: Every day | ORAL | Status: DC
  Filled 2016-04-01: qty 2

## 2016-04-01 MED ORDER — DEXMEDETOMIDINE HCL IN NACL 400 MCG/100ML IV SOLN
0.4000 ug/kg/h | INTRAVENOUS | Status: DC
  Administered 2016-04-01: 1.2 ug/kg/h via INTRAVENOUS
  Administered 2016-04-01: 0.7 ug/kg/h via INTRAVENOUS
  Administered 2016-04-01: 1.2 ug/kg/h via INTRAVENOUS
  Administered 2016-04-01: 0.7 ug/kg/h via INTRAVENOUS
  Administered 2016-04-01 – 2016-04-02 (×6): 1.2 ug/kg/h via INTRAVENOUS
  Filled 2016-04-01 (×3): qty 100
  Filled 2016-04-01: qty 50
  Filled 2016-04-01 (×8): qty 100

## 2016-04-01 MED ORDER — LIDOCAINE 2% (20 MG/ML) 5 ML SYRINGE
Freq: Once | INTRAMUSCULAR | Status: AC
  Administered 2016-04-01: 19:00:00 via INTRAVENOUS
  Filled 2016-04-01: qty 5

## 2016-04-01 MED ORDER — PANTOPRAZOLE SODIUM 40 MG PO TBEC: 40.0000 mg | DELAYED_RELEASE_TABLET | Freq: Every day | ORAL | Status: DC

## 2016-04-01 MED ORDER — NITROGLYCERIN IN D5W 200-5 MCG/ML-% IV SOLN: 0.0000 ug/min | INTRAVENOUS | Status: DC

## 2016-04-01 MED ORDER — SODIUM CHLORIDE 0.9 % IV SOLN
Freq: Once | INTRAVENOUS | Status: AC
  Administered 2016-04-01: 10:00:00 via INTRAVENOUS

## 2016-04-01 MED ORDER — SODIUM CHLORIDE 0.45 % IV SOLN
INTRAVENOUS | Status: DC | PRN
  Administered 2016-04-02: 20 mL/h via INTRAVENOUS
  Administered 2016-04-03: 06:00:00 via INTRAVENOUS

## 2016-04-01 MED ORDER — DOPAMINE-DEXTROSE 3.2-5 MG/ML-% IV SOLN: 0.0000 ug/kg/min | INTRAVENOUS | Status: DC

## 2016-04-01 MED ORDER — ALBUMIN HUMAN 5 % IV SOLN
INTRAVENOUS | Status: DC | PRN
  Administered 2016-04-01 (×2): via INTRAVENOUS

## 2016-04-01 MED ORDER — SODIUM CHLORIDE 0.9% FLUSH: 10.0000 mL | INTRAVENOUS | Status: DC | PRN

## 2016-04-01 MED ORDER — METOPROLOL TARTRATE 25 MG/10 ML ORAL SUSPENSION
12.5000 mg | Freq: Two times a day (BID) | ORAL | Status: DC
  Administered 2016-04-05 – 2016-04-06 (×2): 12.5 mg
  Filled 2016-04-01 (×2): qty 5

## 2016-04-01 MED ORDER — SODIUM CHLORIDE 0.9 % IJ SOLN
INTRAMUSCULAR | Status: AC
  Filled 2016-04-01: qty 10

## 2016-04-01 MED ORDER — METOPROLOL TARTRATE 5 MG/5ML IV SOLN
2.5000 mg | INTRAVENOUS | Status: DC | PRN
  Administered 2016-04-05: 5 mg via INTRAVENOUS
  Administered 2016-04-06 (×2): 2.5 mg via INTRAVENOUS
  Administered 2016-04-07: 5 mg via INTRAVENOUS
  Filled 2016-04-01 (×4): qty 5

## 2016-04-01 MED ORDER — PROTAMINE SULFATE 10 MG/ML IV SOLN
INTRAVENOUS | Status: AC
  Filled 2016-04-01: qty 10

## 2016-04-01 MED ORDER — SODIUM BICARBONATE 8.4 % IV SOLN
50.0000 meq | Freq: Once | INTRAVENOUS | Status: AC
  Administered 2016-04-01: 50 meq via INTRAVENOUS

## 2016-04-01 MED ORDER — AMIODARONE HCL IN DEXTROSE 360-4.14 MG/200ML-% IV SOLN
30.0000 mg/h | INTRAVENOUS | Status: DC
  Administered 2016-04-02 – 2016-04-07 (×9): 30 mg/h via INTRAVENOUS
  Filled 2016-04-01 (×11): qty 200

## 2016-04-01 MED ORDER — ETOMIDATE 2 MG/ML IV SOLN
INTRAVENOUS | Status: AC
  Filled 2016-04-01: qty 20

## 2016-04-01 MED ORDER — LACTATED RINGERS IV SOLN: 500.0000 mL | Freq: Once | INTRAVENOUS | Status: DC | PRN

## 2016-04-01 MED ORDER — CALCIUM CHLORIDE 10 % IV SOLN
INTRAVENOUS | Status: DC | PRN
  Administered 2016-04-01 (×2): 300 mg via INTRAVENOUS
  Administered 2016-04-01: 400 mg via INTRAVENOUS

## 2016-04-01 MED ORDER — AMIODARONE LOAD VIA INFUSION
150.0000 mg | Freq: Once | INTRAVENOUS | Status: AC
  Administered 2016-04-01: 150 mg via INTRAVENOUS
  Filled 2016-04-01: qty 83.34

## 2016-04-01 MED ORDER — DEXMEDETOMIDINE HCL IN NACL 200 MCG/50ML IV SOLN
INTRAVENOUS | Status: AC
  Filled 2016-04-01: qty 50

## 2016-04-01 MED ORDER — AMIODARONE HCL IN DEXTROSE 360-4.14 MG/200ML-% IV SOLN
60.0000 mg/h | INTRAVENOUS | Status: AC
  Administered 2016-04-01 (×2): 60 mg/h via INTRAVENOUS
  Filled 2016-04-01 (×2): qty 200

## 2016-04-01 MED ORDER — MIDAZOLAM HCL 2 MG/2ML IJ SOLN
2.0000 mg | INTRAMUSCULAR | Status: DC | PRN
  Administered 2016-04-01 – 2016-04-02 (×10): 2 mg via INTRAVENOUS
  Filled 2016-04-01 (×10): qty 2

## 2016-04-01 MED ORDER — HEPARIN SODIUM (PORCINE) 1000 UNIT/ML IJ SOLN
INTRAMUSCULAR | Status: AC
  Filled 2016-04-01: qty 1

## 2016-04-01 MED ORDER — DEXTROSE 5 % IV SOLN
1.5000 g | Freq: Two times a day (BID) | INTRAVENOUS | Status: AC
  Administered 2016-04-01 – 2016-04-02 (×4): 1.5 g via INTRAVENOUS
  Filled 2016-04-01 (×4): qty 1.5

## 2016-04-01 MED ORDER — ALBUMIN HUMAN 5 % IV SOLN
250.0000 mL | INTRAVENOUS | Status: AC | PRN
  Administered 2016-04-01 (×3): 250 mL via INTRAVENOUS
  Filled 2016-04-01: qty 250

## 2016-04-01 MED ORDER — ARTIFICIAL TEARS OP OINT
TOPICAL_OINTMENT | OPHTHALMIC | Status: AC
  Filled 2016-04-01: qty 3.5

## 2016-04-01 MED ORDER — ACETAMINOPHEN 160 MG/5ML PO SOLN
1000.0000 mg | Freq: Four times a day (QID) | ORAL | Status: AC
  Administered 2016-04-01 – 2016-04-06 (×18): 1000 mg
  Filled 2016-04-01 (×18): qty 40.6

## 2016-04-01 MED ORDER — FUROSEMIDE 10 MG/ML IJ SOLN
INTRAMUSCULAR | Status: AC
  Filled 2016-04-01: qty 4

## 2016-04-01 NOTE — Transfer of Care (Signed)
Immediate Anesthesia Transfer of Care Note  Patient: Jon Rodriguez  Procedure(s) Performed: Procedure(s): REPLACEMENT OF ASCENDING AORTA USING A 77mm HEMASHIELD PLATINUM VASCULAR GRAFT (N/A)  Patient Location: SICU  Anesthesia Type:General  Level of Consciousness: sedated and Patient remains intubated per anesthesia plan  Airway & Oxygen Therapy: Patient remains intubated per anesthesia plan and Patient placed on Ventilator (see vital sign flow sheet for setting)  Post-op Assessment: Report given to RN and Post -op Vital signs reviewed and stable  Post vital signs: Reviewed and stable  Last Vitals:  Filed Vitals:   03/31/16 1500 03/31/16 1530  BP: 92/56 109/48  Pulse: 67 57  Temp:    Resp: 15 20    Last Pain:  Filed Vitals:   04/01/16 0231  PainSc: 4          Complications: No apparent anesthesia complications

## 2016-04-01 NOTE — Anesthesia Postprocedure Evaluation (Signed)
Anesthesia Post Note  Patient: Jon Rodriguez  Procedure(s) Performed: Procedure(s) (LRB): REPLACEMENT OF ASCENDING AORTA USING A 53mm HEMASHIELD PLATINUM VASCULAR GRAFT (N/A)  Patient location during evaluation: SICU Anesthesia Type: General Level of consciousness: sedated and patient remains intubated per anesthesia plan Pain management: pain level controlled Vital Signs Assessment: post-procedure vital signs reviewed and stable Respiratory status: patient on ventilator - see flowsheet for VS Cardiovascular status: blood pressure returned to baseline Anesthetic complications: no    Last Vitals:  Filed Vitals:   04/01/16 0600 04/01/16 0615  BP: 96/51   Pulse: 82 82  Temp: 36 C 36 C  Resp: 14 16    Last Pain:  Filed Vitals:   04/01/16 0620  PainSc: 4                  Zykeriah Mathia COKER

## 2016-04-01 NOTE — CV Procedure (Signed)
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 He was then brought to the operating room for emergency repair of his aortic dissection by Dr. Prescott Gum. Intraoperative transesophageal echocardiography was indicated to evaluate the extent of the aortic dissection, to assess the aortic valve, to assess for hemopericardium, and  To look for evidence of myocardial ischemia due to the  aortic dissection.  The patient  was brought to the operating room at [facility] and general anesthesia was induced without difficulty. Following endotracheal intubation and orogastric suctioning, the transesophageal echocardiography probe was inserted in the esophagus without difficulty.  Impression: Pre-bypass findings:  1. Ascending aorta: There was an obvious aortic dissection involving the ascending aorta. There was a dissection flap extending into the aortic root just proximal to the sinotubular junction. There was no obvious involvement of the right or left coronary ostia. The dissection flap extended into the descending aorta and was present as far distally as the descending aorta could be imaged. The dissection flap appeared to occupy approximately two thirds of the lumen of the cross-sectional area of the descending aorta with the true lumen occupying the remaining one third of the cross-sectional area. 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 The diameter of the aortic root across the sinuses of Valsalva measured 5.61 cm. The proximal ascending aorta distal to the sinuses of Valsalva measured 5.46 cm.  2. Aortic valve: The aortic valve was trileaflet. The leaflets appeared to open without restriction. There was aortic insufficiency present which appeared to originate from the region of the commissure between  the left and noncoronary cusps. The  vena contracta measured 0.36 cm. The aortic insufficiency was judged as moderate  3. Mitral valve: The mitral valve appeared to open without restriction. There was trace mitral insufficiency and no other obvious mitral valve pathology.  4. Left ventricle: Imaging of the left ventricule in short axis was difficult due to the patient's body habitus and difficult imaging windows. There appeared to be normal systolic function of the left ventricle. The left ventricular cavity appeared to be of normal size. There were no regional wall motion abnormalities appreciated. The ejection fraction was estimated at 60-65%. There was a small pericardial effusion present  5. Right ventricle: The right ventricular cavity could not be well visualized. There appeared to be no obvious right ventricular dysfunction.  6. Tricuspid valve: The tricuspid valve could not be well visualized but there appeared to be trace tricuspid insufficiency.  7. Interatrial septum: Due to the enlargement of the aorta, imaging of the  interatrial septum was somewhat difficult. There was no evidence of patent foramen ovale or atrial septal defect by color Doppler.  8. Left atrium: There appeared to be no thrombus within the left atrium or left atrial appendage.  Post-bypass findings:  1. Ascending aorta: There was a graft replacing the ascending aorta with the proximal anastomosis  just distal to the sinuses of Valsalva. No  anastomotic leak could be appreciated.   2. Aortic valve: The native aortic valve was in place. There was persistent aortic insufficiency with  a jet originating in the region of the commissure between the left and noncoronary cusps and tracking along the undersurface of the anterior leaflet of the mitral valve. The aortic insufficiency was judged as moderate.  3. Mitral valve: There was trace mitral insufficiency which was unchanged  from the pre-bypass study.  4. Left  ventricle: There was vigorous contractility in all LV segments. The ejection fraction was estimated at 60-65%.  5. Right ventricle: Upon initial separation from cardiopulmonary bypass, there appeared to be significant right ventricular dysfunction with decreased contractility the right ventricular free wall. Upon return to cardiopulmonary bypass, insertion of an intra-aortic balloon pump and resting of the right ventricle, a second attempt at weaning from cardiopulmonary bypass revealed improved right ventricular function with adequate right ventricular free wall contractility  and no interventricular septal flattening noted.  6. Tricuspid valve: There was trace tricuspid insufficiency.  7. Descending aorta: An intra-aortic balloon pump was inserted into the descending aorta. Echo guidance was used to ensure that the balloon pump was inserted into the true lumen. The relative sizes of the true and false lumens were unchanged from the pre-bypass study.  Roberts Gaudy, MD

## 2016-04-01 NOTE — Progress Notes (Signed)
Contacted Dr Prescott Gum r/t Pt's continued ectopy after lidocaine bolus. Pt hemodynamic status reviewed and orders received for Amiodarone protocol with bolus. During initial bolus, Pt's ectopy remained erratic with failure to capture in VVI mode. Pt's epicardial pacer mode was changed to DDD at a rate of 88 bpm, 12 mA for both atrial and ventricular output. Better pacer capture noted on monitor. Will cont' to monitor and observe.

## 2016-04-01 NOTE — Progress Notes (Signed)
1 Day Post-Op Procedure(s) (LRB): REPLACEMENT OF ASCENDING AORTA USING A 62mm HEMASHIELD PLATINUM VASCULAR GRAFT (N/A) Subjective: Patient starting to wake up and moving all extremities Hemodynamics stable, chest tube output acceptable Remains on 100% FiO2 for adequate oxygenation-we will wean as tolerated today and increase sedation while on ventilator  Objective: Vital signs in last 24 hours: Temp:  [96.6 F (35.9 C)-97.7 F (36.5 C)] 97.7 F (36.5 C) (07/09 1130) Pulse Rate:  [57-83] 75 (07/09 1130) Cardiac Rhythm:  [-] Normal sinus rhythm (07/09 0800) Resp:  [0-24] 16 (07/09 1130) BP: (78-120)/(47-67) 115/57 mmHg (07/09 1105) SpO2:  [90 %-100 %] 98 % (07/09 1130) FiO2 (%):  [100 %] 100 % (07/09 1105) Weight:  [285 lb 0.9 oz (129.3 kg)] 285 lb 0.9 oz (129.3 kg) (07/09 0200)  Hemodynamic parameters for last 24 hours: PAP: (25-35)/(11-17) 29/14 mmHg CVP:  [7 mmHg-15 mmHg] 9 mmHg CO:  [6.1 L/min-7.6 L/min] 7.6 L/min CI:  [2.4 L/min/m2-3 L/min/m2] 3 L/min/m2  Intake/Output from previous day: 07/08 0701 - 07/09 0700 In: 8499.3 [I.V.:3977.3; Blood:3092; NG/GT:30; IV Piggyback:1400] Out: [id number] [Urine:1200; Blood:1855; Chest Tube:260] Intake/Output this shift: Total I/O In: 785.7 [I.V.:325.7; Blood:30; NG/GT:30; IV Piggyback:400] Out: [id number] [Urine:285; Chest Tube:350]  Breath sounds are coarse  extremities warm Pulses in all extremities Balloon pump at 1-2  Lab Results:  Recent Labs  04/01/16 0450 04/01/16 0500 04/01/16 0955  WBC 14.2*  --  14.8*  HGB 10.5* 9.5* 9.9*  HCT 30.4* 28.0* 28.9*  PLT 106*  --  96*   BMET:  Recent Labs  03/31/16 1148  04/01/16 0240 04/01/16 0450 04/01/16 0500 04/01/16 0955  NA 139  < > 143 141 143  --   K 4.4  < > 3.9 3.6 3.6  --   CL 107  < > 103 110  --   --   CO2 24  --   --  22  --   --   GLUCOSE 115*  < > 207* 227* 218*  --   BUN 14  < > 19 16  --   --   CREATININE 1.33*  < > 1.20 1.43*  --  1.73*  CALCIUM 9.6  --   --  7.8*   --   --   < > = values in this interval not displayed.  PT/INR:  Recent Labs  04/01/16 0955  LABPROT 15.2  INR 1.18   ABG    Component Value Date/Time   PHART 7.331* 04/01/2016 0759   HCO3 19.2* 04/01/2016 0759   TCO2 20 04/01/2016 0759   ACIDBASEDEF 6.0* 04/01/2016 0759   O2SAT 93.0 04/01/2016 0759   CBG (last 3)   Recent Labs  04/01/16 0604 04/01/16 0653  GLUCAP 249* 234*    Assessment/Plan: S/P Procedure(s) (LRB): REPLACEMENT OF ASCENDING AORTA USING A 37mm HEMASHIELD PLATINUM VASCULAR GRAFT (N/A) Keep sedated on ventilator today and try to wean FiO2 as tolerated Follow postop metabolic acidosis, potassium level and hemoglobin Wean balloon pump 1-3 starting tonight   LOS: 1 day    Tharon Aquas Trigt III 04/01/2016

## 2016-04-01 NOTE — Op Note (Signed)
NAMERENLY, DEBAUN NO.:  [id number]  MEDICAL RECORD NO.:  [id number]  LOCATION:  2S08C                        FACILITY:  [facility]  PHYSICIAN:  Ivin Poot, M.D.  DATE OF BIRTH:  1961/04/20  DATE OF PROCEDURE:  03/31/2016 DATE OF DISCHARGE:                              OPERATIVE REPORT   OPERATION: 1. 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 2. Resuspension of the aortic valve for aortic insufficiency. 3. Hypothermic circulatory arrest with antegrade cerebral perfusion. 4. Placement of intra-aortic balloon pump for postop pump RV     dysfunction.  SURGEON:  Ivin Poot, MD.  ASSISTANT:  Valentina Gu, SA.  ANESTHESIA:  General by Dr. Roberts Gaudy.  PREOPERATIVE DIAGNOSES: 1. 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 2. Moderate aortic insufficiency from acute dissection.  POSTOPERATIVE DIAGNOSES: 1. 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 2. Moderate aortic insufficiency from acute dissection.  CLINICAL NOTE:  The patient is a 55 year old obese Caucasian male with history of smoking and history of hypertension who was working out at a local fitness center lifting weights when he had sudden tearing pain in his chest that radiated to his shoulders and arms.  He presented to the emergency department.  Cardiac enzymes were minimally elevated.  EKG was nonspecific.  He was observed and treated for his pain.  A CT scan to rule out pulmonary embolus was performed which was negative for pulmonary embolus, however, the aortic size appeared to be enlarged. His troponins returned positive and he underwent rescanning using arterial contrast which showed type A ascending aortic dissection extending from the aortic root just above the coronaries  through the arch and down to the aortic bifurcation in the abdomen.  The patient remained hemodynamically stable.  but was having persistent chest pain. I evaluated the patient in the emergency department after reviewing his CT scan.  I recommended emergency repair of his aortic type A dissection to prevent high likelihood of sudden death.  I discussed the procedure of repair of type A dissection including the use of general anesthesia, cardiopulmonary bypass, and hypothermic circulatory arrest.  I discussed location of the surgical incisions and the expected postoperative recovery.  I reviewed the potential risks of the operation including the risks of stroke, bleeding, blood transfusion requirement, postoperative pulmonary problems including pleural effusions, and ventilator dependence, infection, and death.  After reviewing these issues, he demonstrated his understanding and agreed to proceed with surgery under what I felt was an informed consent.  OPERATIVE FINDINGS: 1. Severe dissection of the ascending aorta with a transverse tear     just above the aortic anulus at the location of left and     noncoronary commissures.  This tract also closed the left main     coronary ostia. 2. Chronic thickening of the ascending aorta and adherence to the main     pulmonary artery. 3. Successful repair of the origin of the intimal tear  and redirection     of blood flow into the true lumen. 4. Postoperative transient RV dysfunction treated with inotropes and     placement of balloon pump.  OPERATIVE PROCEDURE:  The patient was brought directly to the operating room from the emergency department where he was prepared for emergency repair of aortic dissection.  Preoperative informed consent was obtained.  The patient was brought to the operating room and placed supine on the operating table.  General anesthesia was induced.  He remained stable.  Invasive lines were placed by the Anesthesia  team. Transesophageal echo probe was placed by the Anesthesia team.  The patient was prepped and draped as a sterile field.  A proper time-out was performed.  A left femoral A-line was placed for blood pressure monitoring.  An incision was made beneath the right clavicle and extended through the pectoralis major and pectoralis minor muscles.  The axillary artery and vein were identified.  The artery was carefully dissected away from the vein and the brachial plexus surrounded with vessel loops.  Heparin 4000 units was administered and the artery was clamped proximally and distally.  An arteriotomy was made and an 8 mm Gore-Tex graft-arterial cannula was then sewn end-to-side using running 5-0 Prolene.  A light layer of Coseal was placed around the sewn anastomosis and the clamps were removed.  There was good hemostasis.  The cannula was clamped at the head of the field for connection to the cardiopulmonary bypass circuit.  A sternal incision was made.  The ascending aorta was very dilated and just underneath the posterior table of the sternum.  A retractor was placed and the pericardium was opened.  There was serosanguineous fluid in the pericardium.  The aortic wall was hemorrhagic from blood in the subadventitial false lumen.  A pursestring was placed in the right atrium.  The patient was heparinized.  A 2 stage venous cannula was placed in the right atrium and both the arterial and venous cannulas were connected to the cardiopulmonary bypass circuit.  The patient was then placed on cardiopulmonary bypass with decompression of the heart.  A pursestring was placed in right superior pulmonary vein and the left ventricular vent was placed.  A pursestring was placed in the right atrium for retrograde coronary sinus cardioplegia catheter which was also placed.  The innominate vein was dissected out of the mediastinum and gently retracted inferiorly.  The innominate artery was dissected  and encircled with a vessel loop.  The pulmonary artery was carefully dissected from the ascending aorta. There was subepicardial hemorrhage and hematoma in this area.  Using careful dissection and tedious working, the aorta was very indurated and thickened at the beginning of the arch.  The patient during this period time was being cooled for circulatory arrest down to 18 degrees.  A crossclamp using the large insert was then placed in the proximal arch.  However, this did not decompress the ascending aorta.  The crossclamp was then removed.  I then tried several other types of clamps to occlude the ascending aorta at the arch, however, none load provide complete closure of the aorta probably from the very thickened posterior wall which would not allow the clamp draws to fully close.  It was decided to proceed with hypothermic circulatory arrest for the procedure and the head was packed in ice and anesthesia gave the patient steroids and extra anesthesia.  Cerebral oximeters were used to assess perfusion of both hemispheres of the brain.  The patient's temperature  reached 18 degrees, and the patient was placed in steep Trendelenburg and blood was drained to the cardiotomy reservoir.  A clamp was placed on the innominate artery and antegrade cerebral perfusion at 600 mL/minute through the axillary artery was then performed.  Cerebral oximeter readings remained stable.  In circulatory arrest, the aorta were then divided beneath the innominate artery.  It was very adherent to the pulmonary artery and an opening in the pulmonary artery was created in the removal of the aorta which was repaired with interrupted 4-0 Prolene sutures.  The aorta was transected beneath the intimal tear so as to exclude that from the aortic anatomy.  This left a small but usable segment of ascending aorta above the aortic valve and coronaries.  It was clear that to perform a full replacement in this patient,  the coronaries would be virtually impossible to mobilize and gain access for anastomosis due to the large amount of mediastinal fat and a very deep nature of the coronary ostia which were not even visible.  First, the false lumen was repaired using Teflon felt and BioGlue and a Foley balloon to obliterate the false lumen and the proximal arch.  This created a nice wall of the aorta for the anastomosis.  A 30 mm Hemashield platinum graft was selected and then sewed and then to the proximal arch using a running 4-0 Prolene which was reinforced with interrupted 4-0 pledgeted Prolenes around the lower part of the mid anterior aspect of the anastomosis and then on the exterior portion of the superior part of the anastomosis.  The false lumen was obliterated in the aortic root by using a piece of Teflon felt and bile glue to eliminate the false lumen.  The aortic valve was tested and it appeared that the noncoronary leaflet was not fully supported because of the dissection and this was reinforced with a pledgeted 4-0 Prolene inside to outside the aortic wall for resuspension of the commissures.  The aortic graft was trimmed to the appropriate length and angle to sew to the proximal aorta at approximately the level of the sinotubular junction, but close to the aortic valve posteriorly.  This was performed again with a running 4-0 Prolene on the posterior aspect with interior interrupted 4-0 Prolene pledgeted sutures to reinforce the suture line. The suture line was then run along the anterior aspect of the anastomosis and then again reinforced with interrupted pledgeted sutures on the outside of the aorta.  The aortic graft was opened with a cautery and a vent was placed.  The patient was placed in steep Trendelenburg.  CO2 had been insufflated during into the operative field during the procedure.  The clamp on the innominate artery was then opened and full flow to the patient and to the  heart was then reestablished.  During the period of circulatory arrest, cardioplegia was delivered to the heart every 20 minutes using 250 mL of cold blood cardioplegia. Septal temperature remained less than 15 degrees.  We then began warming the patient.  There was some bleeding on the posterior aspect of the distal anastomosis which was repaired with a single pledgeted 3-0 Prolene.  It took an hour and half to rewarm the patient back to body temperature. It should be noted that the a Foley bladder temperature was not available since the patient had BPH and Urology was placed a non temperature Foley at the beginning of the operation.  The patient had pacing wires were applied.  The lungs were expanded  and ventilator was resumed.  Inotropes were started.  The cardioplegia catheter was removed.  The patient was then weaned from cardiopulmonary bypass.  It was clear that the RV function was suboptimal.  LV function looked normal.  Despite giving the patient more time, we decided to go back on bypass.  There was some ST elevation in the inferior leads and the patient may have been trained some air in the right coronary circulation.  We reperfused for a prolonged period of time, but the RV function did not appear to improved when volume was left in the heart to assess function.  I then placed a balloon pump through the left femoral artery site.  The guidewire was passed using TEE guidance to confirm that the balloon pump would go in the true lumen.  We then separated from cardiopulmonary bypass with balloon pump easily. RV function appeared to be significantly improved.  Protamine was administered without adverse reaction.  The cannula was removed.  The graft-cannula and the axillary artery was stapled off and removed.  The mediastinum was irrigated.  There was diffuse coagulopathy.  Coagulation parameters were abnormal. Platelet count was 45,000.  Fibrinogen was only 120.  The  patient was given FFP platelets and cryoprecipitate.  There was some improvement but still diffuse coagulopathy.  We then gave a 1/2 calculated dose of Factor 7 and this improved the diffuse coagulopathy.  The superior pericardial fat was closed over the aorta.  Bilateral pleural tubes and anterior mid posterior mediastinal tubes were placed. The sternum was closed with wire.  The patient remained stable.  The pectoralis fascia was closed with a running #1 Vicryl.  The subcutaneous and skin layers were closed in running Vicryl.  The right axillary incision was closed in layers using Vicryl.  The patient then returned to the ICU in critical, but stable condition. Total cardiopulmonary bypass time was 270 minutes.  Antegrade cerebral perfusion time was 120 minutes.     Ivin Poot, M.D.     PV/MEDQ  D:  04/01/2016  T:  04/01/2016  Job:  [id number]  cc:   Delmar Landau) Harrington Challenger, M.D.

## 2016-04-01 NOTE — OR Nursing (Signed)
1st call made @0226  to 2S, provided with a patient update.  Plans for transfer to Room 8 postoperatively. 2nd call made @0300  to 2S, providing a patient update (informed of IABP in L fem). 3rd call made @0336  to 2S, providing a patient update. 4th call made @0432  to 2S, providing a patient update.  Transferring patient to Room 8 postoperatively.

## 2016-04-01 NOTE — Progress Notes (Signed)
While doing mouth care pt woke up, bucking vent, trying to sit up. PT output 69ml in 15 min. Notified Dr. Prescott Gum. Per MD increase PEEP to 10 and give 2 units FFP. Will continue to monitor closely.

## 2016-04-01 NOTE — Brief Op Note (Signed)
03/31/2016 - 04/01/2016  4:01 AM  PATIENT:  Jon Rodriguez  55 y.o. male  PRE-OPERATIVE DIAGNOSIS:  AORTIC DISSECTION  POST-OPERATIVE DIAGNOSIS:  AORTIC DISSECTION  PROCEDURE:  Procedure(s): REPLACEMENT OF ASCENDING AORTA USING A 54mm HEMASHIELD PLATINUM VASCULAR GRAFT (N/A)  Antegrade cerebral perfusion Hypothermic circulatory arrest  SURGEON:  Surgeon(s) and Role:    * Ivin Poot, MD - Primary  PHYSICIAN ASSISTANT: Amaryllis Dyke SA  ASSISTANTS: T O W , RNFA  ANESTHESIA:   general  EBL:  Total I/O In: [id number] [I.V.:3600; Blood:3092; IV Piggyback:250] Out: 600 [Urine:600]  BLOOD ADMINISTERED:2 units CC PRBC, 2 units FFP and 3 units PLTS  DRAINS: 2 MTs  2 PTs   LOCAL MEDICATIONS USED:  NONE  SPECIMEN:  Source of Specimen:  thoracic aorta and Excision  DISPOSITION OF SPECIMEN:  PATHOLOGY  COUNTS:  YES  TOURNIQUET:  * No tourniquets in log *  DICTATION: .Dragon Dictation  PLAN OF CARE: Admit to inpatient   PATIENT DISPOSITION:  ICU - intubated and critically ill.   Delay start of Pharmacological VTE agent (>24hrs) due to surgical blood loss or risk of bleeding: yes

## 2016-04-01 NOTE — Op Note (Signed)
NAMESANTHOSH, RANFT NO.:  [id number]  MEDICAL RECORD NO.:  [id number]  LOCATION:  [facility]                         FACILITY:  [facility]  PHYSICIAN:  Marshall Cork. Jeffie Pollock, M.D.    DATE OF BIRTH:  1961/08/26  DATE OF PROCEDURE:  03/31/2016 DATE OF DISCHARGE:                              OPERATIVE REPORT   The patient of Dr. Lajean Saver.  CHIEF COMPLAINT:  Difficult Foley placement.  HISTORY:  Mr. Dudoit is a 55 year old white male with no prior urologic history.  He was on the table for repair of aortic dissection and attempted Foley catheter placement with a straight Coude catheter was unsuccessful, and I was asked to place a catheter.  PAST HISTORY:  Significant for high blood pressure and high cholesterol.  SURGICAL HISTORY:  Significant only for an appendectomy.  SOCIAL HISTORY:  Significant for prior tobacco use.  No alcohol use.  FAMILY HISTORY:  Pertinent for heart disease and hypertension.  MEDICATIONS:  Included aspirin, fish oil, magnesium, potassium, and multivitamin as well as simvastatin.  REVIEW OF SYSTEMS:  The patient was intubated under anesthesia, so not obtained.  PHYSICAL EXAMINATION:  VITAL SIGNS:  Blood pressure is 109/48, heart rate 57, respirations 20, temperature 97.9. GENERAL:  This is a well-developed, well-nourished, white male under general anesthesia. GU:  Exam reveals an unremarkable phallus with an adequate meatus. Scrotum contents were unremarkable.  DESCRIPTION OF PROCEDURE:  After completion of the initial evaluation, the patient was prepped with Betadine solution and draped with sterile towels.  The urethra was instilled with 20 mL of 2% lidocaine jelly.  A 16-French Coude Foley catheter was passed.  There were some resistance of the prostate.  With the additional lubrication, it slid into the bladder.  Once the catheter was held, the balloon was filled with 10 mL of sterile fluid.  The catheter was then backed out and  aspirated with the balloon syringe with return of clear urine.  The catheter was placed to straight drainage, and the procedure was returned to Dr. Prescott Gum. There were no complications.     Marshall Cork. Jeffie Pollock, M.D.     JJW/MEDQ  D:  03/31/2016  T:  04/01/2016  Job:  [id number]  cc:   Silvano Bilis. Ashok Cordia, M.D.

## 2016-04-01 NOTE — Progress Notes (Signed)
Orthopedic Tech Progress Note Patient Details:  Jon Rodriguez 1961/08/12 [id number]  Ortho Devices Type of Ortho Device: Knee Immobilizer Ortho Device/Splint Location: lle Ortho Device/Splint Interventions: Application   Alesia Oshields 04/01/2016, 9:13 AM

## 2016-04-01 NOTE — Addendum Note (Signed)
Addendum  created 04/01/16 0802 by Roberts Gaudy, MD   Modules edited: Notes Section   Notes Section:  File: [id number]; Pend: [id number]; Pend: [id number]; Pend: [id number]; Pend: [id number]; Pend: [id number]; Pend: [id number]; Pend: [id number]; [place]: [id number]; [place]: [id number]

## 2016-04-02 ENCOUNTER — Inpatient Hospital Stay (HOSPITAL_COMMUNITY): Payer: 59

## 2016-04-02 ENCOUNTER — Encounter (HOSPITAL_COMMUNITY): Payer: Self-pay | Admitting: Cardiothoracic Surgery

## 2016-04-02 DIAGNOSIS — J9601 Acute respiratory failure with hypoxia: Secondary | ICD-10-CM

## 2016-04-02 LAB — COMPREHENSIVE METABOLIC PANEL
ALT: 359 U/L — ABNORMAL HIGH (ref 17–63)
AST: 328 U/L — ABNORMAL HIGH (ref 15–41)
Albumin: 2.5 g/dL — ABNORMAL LOW (ref 3.5–5.0)
Alkaline Phosphatase: 36 U/L — ABNORMAL LOW (ref 38–126)
Anion gap: 4 — ABNORMAL LOW (ref 5–15)
BUN: 21 mg/dL — ABNORMAL HIGH (ref 6–20)
CO2: 27 mmol/L (ref 22–32)
Calcium: 7.7 mg/dL — ABNORMAL LOW (ref 8.9–10.3)
Chloride: 110 mmol/L (ref 101–111)
Creatinine, Ser: 1.53 mg/dL — ABNORMAL HIGH (ref 0.61–1.24)
GFR calc Af Amer: 58 mL/min — ABNORMAL LOW (ref >60)
GFR calc non Af Amer: 50 mL/min — ABNORMAL LOW (ref >60)
Glucose, Bld: 120 mg/dL — ABNORMAL HIGH (ref 65–99)
Potassium: 3.7 mmol/L (ref 3.5–5.1)
Sodium: 141 mmol/L (ref 135–145)
Total Bilirubin: 0.8 mg/dL (ref 0.3–1.2)
Total Protein: 4 g/dL — ABNORMAL LOW (ref 6.5–8.1)

## 2016-04-02 LAB — POCT I-STAT 3, ART BLOOD GAS (G3+)
Acid-Base Excess: 1 mmol/L (ref 0.0–2.0)
Acid-Base Excess: 3 mmol/L — ABNORMAL HIGH (ref 0.0–2.0)
Bicarbonate: 24.6 mEq/L — ABNORMAL HIGH (ref 20.0–24.0)
Bicarbonate: 24.9 mEq/L — ABNORMAL HIGH (ref 20.0–24.0)
Bicarbonate: 25.1 mEq/L — ABNORMAL HIGH (ref 20.0–24.0)
Bicarbonate: 27.1 mEq/L — ABNORMAL HIGH (ref 20.0–24.0)
O2 Saturation: 87 %
O2 Saturation: 91 %
O2 Saturation: 92 %
O2 Saturation: 92 %
Patient temperature: 36.3
Patient temperature: 36.5
Patient temperature: 36.5
Patient temperature: 36.7
TCO2: 26 mmol/L (ref 0–100)
TCO2: 26 mmol/L (ref 0–100)
TCO2: 26 mmol/L (ref 0–100)
TCO2: 28 mmol/L (ref 0–100)
pCO2 arterial: 35.9 mmHg (ref 35.0–45.0)
pCO2 arterial: 36.4 mmHg (ref 35.0–45.0)
pCO2 arterial: 36.6 mmHg (ref 35.0–45.0)
pCO2 arterial: 38.5 mmHg (ref 35.0–45.0)
pH, Arterial: 7.42 (ref 7.350–7.450)
pH, Arterial: 7.44 (ref 7.350–7.450)
pH, Arterial: 7.444 (ref 7.350–7.450)
pH, Arterial: 7.475 — ABNORMAL HIGH (ref 7.350–7.450)
pO2, Arterial: 48 mmHg — ABNORMAL LOW (ref 80.0–100.0)
pO2, Arterial: 57 mmHg — ABNORMAL LOW (ref 80.0–100.0)
pO2, Arterial: 58 mmHg — ABNORMAL LOW (ref 80.0–100.0)
pO2, Arterial: 61 mmHg — ABNORMAL LOW (ref 80.0–100.0)

## 2016-04-02 LAB — PREPARE FRESH FROZEN PLASMA
Unit division: 0
Unit division: 0
Unit division: 0
Unit division: 0

## 2016-04-02 LAB — PREPARE PLATELET PHERESIS
UNIT DIVISION: 0
Unit division: 0
Unit division: 0
Unit division: 0

## 2016-04-02 LAB — GLUCOSE, CAPILLARY
Glucose-Capillary: 104 mg/dL — ABNORMAL HIGH (ref 65–99)
Glucose-Capillary: 107 mg/dL — ABNORMAL HIGH (ref 65–99)
Glucose-Capillary: 112 mg/dL — ABNORMAL HIGH (ref 65–99)
Glucose-Capillary: 114 mg/dL — ABNORMAL HIGH (ref 65–99)
Glucose-Capillary: 116 mg/dL — ABNORMAL HIGH (ref 65–99)
Glucose-Capillary: 116 mg/dL — ABNORMAL HIGH (ref 65–99)
Glucose-Capillary: 118 mg/dL — ABNORMAL HIGH (ref 65–99)
Glucose-Capillary: 118 mg/dL — ABNORMAL HIGH (ref 65–99)
Glucose-Capillary: 119 mg/dL — ABNORMAL HIGH (ref 65–99)
Glucose-Capillary: 119 mg/dL — ABNORMAL HIGH (ref 65–99)
Glucose-Capillary: 119 mg/dL — ABNORMAL HIGH (ref 65–99)
Glucose-Capillary: 124 mg/dL — ABNORMAL HIGH (ref 65–99)
Glucose-Capillary: 124 mg/dL — ABNORMAL HIGH (ref 65–99)
Glucose-Capillary: 128 mg/dL — ABNORMAL HIGH (ref 65–99)
Glucose-Capillary: 130 mg/dL — ABNORMAL HIGH (ref 65–99)
Glucose-Capillary: 137 mg/dL — ABNORMAL HIGH (ref 65–99)
Glucose-Capillary: 138 mg/dL — ABNORMAL HIGH (ref 65–99)
Glucose-Capillary: 150 mg/dL — ABNORMAL HIGH (ref 65–99)
Glucose-Capillary: 83 mg/dL (ref 65–99)
Glucose-Capillary: 99 mg/dL (ref 65–99)

## 2016-04-02 LAB — PREPARE RBC (CROSSMATCH)

## 2016-04-02 LAB — POCT I-STAT, CHEM 8
BUN: 20 mg/dL (ref 6–20)
Calcium, Ion: 1.2 mmol/L (ref 1.13–1.30)
Chloride: 107 mmol/L (ref 101–111)
Creatinine, Ser: 1.2 mg/dL (ref 0.61–1.24)
Glucose, Bld: 104 mg/dL — ABNORMAL HIGH (ref 65–99)
HCT: 21 % — ABNORMAL LOW (ref 39.0–52.0)
Hemoglobin: 7.1 g/dL — ABNORMAL LOW (ref 13.0–17.0)
Potassium: 3.6 mmol/L (ref 3.5–5.1)
Sodium: 141 mmol/L (ref 135–145)
TCO2: 27 mmol/L (ref 0–100)

## 2016-04-02 LAB — PROTIME-INR
INR: 1.35 (ref 0.00–1.49)
Prothrombin Time: 16.8 seconds — ABNORMAL HIGH (ref 11.6–15.2)

## 2016-04-02 LAB — PREPARE CRYOPRECIPITATE
UNIT DIVISION: 0
Unit division: 0

## 2016-04-02 LAB — CREATININE, SERUM
Creatinine, Ser: 1.26 mg/dL — ABNORMAL HIGH (ref 0.61–1.24)
GFR calc Af Amer: >60 mL/min (ref >60)
GFR calc non Af Amer: >60 mL/min (ref >60)

## 2016-04-02 LAB — POCT ACTIVATED CLOTTING TIME: Activated Clotting Time: 120 seconds

## 2016-04-02 LAB — CBC
HCT: 22.4 % — ABNORMAL LOW (ref 39.0–52.0)
HCT: 24.5 % — ABNORMAL LOW (ref 39.0–52.0)
Hemoglobin: 8 g/dL — ABNORMAL LOW (ref 13.0–17.0)
Hemoglobin: 8.4 g/dL — ABNORMAL LOW (ref 13.0–17.0)
MCH: 32.7 pg (ref 26.0–34.0)
MCH: 31.3 pg (ref 26.0–34.0)
MCHC: 35.7 g/dL (ref 30.0–36.0)
MCHC: 34.3 g/dL (ref 30.0–36.0)
MCV: 91.4 fL (ref 78.0–100.0)
MCV: 91.4 fL (ref 78.0–100.0)
Platelets: 58 10*3/uL — ABNORMAL LOW (ref 150–400)
Platelets: 66 10*3/uL — ABNORMAL LOW (ref 150–400)
RBC: 2.45 MIL/uL — ABNORMAL LOW (ref 4.22–5.81)
RBC: 2.68 MIL/uL — ABNORMAL LOW (ref 4.22–5.81)
RDW: 14.2 % (ref 11.5–15.5)
RDW: 14.2 % (ref 11.5–15.5)
WBC: 12.1 10*3/uL — ABNORMAL HIGH (ref 4.0–10.5)
WBC: 11 10*3/uL — ABNORMAL HIGH (ref 4.0–10.5)

## 2016-04-02 LAB — APTT: aPTT: 35 seconds (ref 24–37)

## 2016-04-02 LAB — MAGNESIUM: Magnesium: 2.2 mg/dL (ref 1.7–2.4)

## 2016-04-02 MED ORDER — POTASSIUM CHLORIDE 10 MEQ/50ML IV SOLN
10.0000 meq | INTRAVENOUS | Status: AC
  Administered 2016-04-02 (×3): 10 meq via INTRAVENOUS
  Filled 2016-04-02 (×3): qty 50

## 2016-04-02 MED ORDER — VASOPRESSIN 20 UNIT/ML IV SOLN
0.0100 [IU]/min | INTRAVENOUS | Status: DC
  Administered 2016-04-02: 0.02 [IU]/min via INTRAVENOUS
  Administered 2016-04-03: 0.03 [IU]/min via INTRAVENOUS
  Filled 2016-04-02 (×2): qty 2

## 2016-04-02 MED ORDER — VECURONIUM BROMIDE 10 MG IV SOLR: 10.0000 mg | INTRAVENOUS | Status: DC | PRN

## 2016-04-02 MED ORDER — VECURONIUM BROMIDE 10 MG IV SOLR
INTRAVENOUS | Status: AC
  Administered 2016-04-02: 10 mg
  Filled 2016-04-02: qty 10

## 2016-04-02 MED ORDER — POTASSIUM CHLORIDE 10 MEQ/50ML IV SOLN
10.0000 meq | INTRAVENOUS | Status: AC
  Administered 2016-04-02 (×3): 10 meq via INTRAVENOUS
  Filled 2016-04-02 (×2): qty 50

## 2016-04-02 MED ORDER — ALBUTEROL SULFATE (2.5 MG/3ML) 0.083% IN NEBU: 2.5000 mg | INHALATION_SOLUTION | RESPIRATORY_TRACT | Status: DC | PRN

## 2016-04-02 MED ORDER — DOPAMINE-DEXTROSE 3.2-5 MG/ML-% IV SOLN
2.5000 ug/kg/min | INTRAVENOUS | Status: DC
  Administered 2016-04-02 – 2016-04-04 (×2): 2.5 ug/kg/min via INTRAVENOUS
  Filled 2016-04-02 (×2): qty 250

## 2016-04-02 MED ORDER — FUROSEMIDE 10 MG/ML IJ SOLN
40.0000 mg | Freq: Two times a day (BID) | INTRAMUSCULAR | Status: DC
  Administered 2016-04-02 (×2): 40 mg via INTRAVENOUS
  Filled 2016-04-02: qty 4

## 2016-04-02 MED ORDER — SODIUM CHLORIDE 0.9 % IV SOLN
0.0000 mg/h | INTRAVENOUS | Status: DC
  Administered 2016-04-02: 4 mg/h via INTRAVENOUS
  Administered 2016-04-02: 2 mg/h via INTRAVENOUS
  Administered 2016-04-03: 10 mg/h via INTRAVENOUS
  Administered 2016-04-03: 9 mg/h via INTRAVENOUS
  Administered 2016-04-03 (×2): 6 mg/h via INTRAVENOUS
  Administered 2016-04-04: 2 mg/h via INTRAVENOUS
  Filled 2016-04-02 (×7): qty 10

## 2016-04-02 MED FILL — Sodium Bicarbonate IV Soln 8.4%: INTRAVENOUS | Qty: 250 | Status: AC

## 2016-04-02 MED FILL — Electrolyte-R (PH 7.4) Solution: INTRAVENOUS | Qty: 5000 | Status: AC

## 2016-04-02 MED FILL — Heparin Sodium (Porcine) Inj 1000 Unit/ML: INTRAMUSCULAR | Qty: 10 | Status: AC

## 2016-04-02 MED FILL — Mannitol IV Soln 20%: INTRAVENOUS | Qty: 500 | Status: AC

## 2016-04-02 MED FILL — Calcium Chloride Inj 10%: INTRAVENOUS | Qty: 10 | Status: AC

## 2016-04-02 MED FILL — Lidocaine HCl IV Inj 20 MG/ML: INTRAVENOUS | Qty: 5 | Status: AC

## 2016-04-02 MED FILL — Sodium Chloride IV Soln 0.9%: INTRAVENOUS | Qty: 3000 | Status: AC

## 2016-04-02 NOTE — Consult Note (Signed)
PULMONARY / CRITICAL CARE MEDICINE   Name: Jon Rodriguez MRN: [id number] DOB: 04/14/1961    ADMISSION DATE:  03/31/2016 CONSULTATION DATE:  04/02/2016  REFERRING MD: Dr. Prescott Gum  CHIEF COMPLAINT:  Ventilator and critical care management  HISTORY OF PRESENT ILLNESS:   55 year old morbidly obese nondiabetic male with history of treated hypertension and moderate smoking felt sudden tearing chest pain at 11 AM today while working out at a fitness center- gym. This did not resolve and he presented to the emergency department. EKG was nonspecific and trach enzymes were first negative and minimally elevated. CT scan rule out pulmonary embolus was negative. The patient was then re-dosed with contrast for a CTA angiogram and this demonstrates a tear in the ascending aorta extending through the arch and descending thoracic aorta down to the aortic abdominal bifurcation. The innominate artery is not involved by the dissection, the left carotid is not involved, the left subclavian artery appears to be involved with the dissection. The renal and visceral vessels come off the true lumen. The ascending aorta and arch are dilated. The descending thoracic aorta appears to be of normal caliber. There is no clear pericardial effusion. 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 He is currently requiring high oxygen demands as well as high needs for sedation.   PAST MEDICAL HISTORY :  He  has a past medical history of Hypertension and High cholesterol.  PAST SURGICAL HISTORY: He  has no past surgical history on file.  No Known Allergies  No current facility-administered medications on file prior to encounter.   No current outpatient prescriptions on file prior to encounter.    FAMILY HISTORY:  His has no family status information on file.   SOCIAL HISTORY: He  reports that he has quit smoking. He does not have any smokeless tobacco history on  file. He reports that he does not drink alcohol.  REVIEW OF SYSTEMS:   Unavailable for interview at this time as he is sedated and intubated.   SUBJECTIVE:  Consulted for critical care management s/p replacement of ascending aorta with a hemashield platinum vascular graft.   VITAL SIGNS: BP 97/55 mmHg  Pulse 89  Temp(Src) 97.7 F (36.5 C) (Core (Comment))  Resp 12  Ht 6\' 2"  (1.88 m)  Wt 144 kg (317 lb 7.4 oz)  BMI 40.74 kg/m2  SpO2 94%  HEMODYNAMICS: PAP: (24-42)/(14-23) 32/17 mmHg CVP:  [8 mmHg-19 mmHg] 16 mmHg CO:  [5.5 L/min-8 L/min] 8 L/min CI:  [2.2 L/min/m2-3.2 L/min/m2] 3.2 L/min/m2  VENTILATOR SETTINGS: Vent Mode:  [-] SIMV;PSV;PRVC FiO2 (%):  [70 %-100 %] 70 % Set Rate:  [12 bmp] 12 bmp Vt Set:  [800 mL] 800 mL PEEP:  [10 cmH20] 10 cmH20 Pressure Support:  [10 cmH20] 10 cmH20 Plateau Pressure:  [24 cmH20-27 cmH20] 24 cmH20  INTAKE / OUTPUT: I/O last 3 completed shifts: In: 13241.7 [I.V.:6790.7; Blood:3631; NG/GT:170; IV [id number] Out: [id number] [Urine:2580; Emesis/NG output:375; Blood:1855; Chest Tube:1685]  PHYSICAL EXAMINATION: General:  Morbidly obese gentleman intubated and sedated.  Neuro:  Sedated on Fentanyl, Versed and Precedex. PERRL.  HEENT: Moist mucous membranes. Intubated Cardiovascular: S1S2, augmented with balloon pump at 1:3 (being pulled this AM). Paced at a rate 90 Lungs: Clear in upper lobes, decreased in bases. Mediastinal chest tubes in place.  Abdomen: Obese, hypoactive bowel sounds.  Musculoskeletal: Sedated Skin: Sternotomy incision, covered with Aquacel  LABS:  BMET  Recent Labs Lab 04/01/16 0450  04/01/16 1000 04/01/16 1600 04/02/16 0406  NA 141  < > 142 142 141  K 3.6  < > 3.6 3.9 3.7  CL 110  --  106 112* 110  CO2 22  --   --  27 27  BUN 16  --  19 19 21*  CREATININE 1.43*  < > 1.30* 1.53* 1.53*  GLUCOSE 227*  < > 214* 84 120*  < > = values in this interval not displayed.  Electrolytes  Recent Labs Lab  04/01/16 0450 04/01/16 0955 04/01/16 1600 04/02/16 0406  CALCIUM 7.8*  --  8.1* 7.7*  MG 2.0 2.9*  --   --     CBC  Recent Labs Lab 04/01/16 0955 04/01/16 1000 04/01/16 1600 04/02/16 0406  WBC 14.8*  --  11.4* 12.1*  HGB 9.9* 9.2* 8.5* 8.0*  HCT 28.9* 27.0* 24.2* 22.4*  PLT 96*  --  66* 58*    Coag's  Recent Labs Lab 03/31/16 1702 04/01/16 0450 04/01/16 0955 04/02/16 0406  APTT 33 53*  --  35  INR 1.12 1.13 1.18 1.35    Sepsis Markers No results for input(s): LATICACIDVEN, PROCALCITON, O2SATVEN in the last 168 hours.  ABG  Recent Labs Lab 04/02/16 0002 04/02/16 0359 04/02/16 0907  PHART 7.444 7.440 7.420  PCO2ART 35.9 36.4 38.5  PO2ART 61.0* 58.0* 57.0*    Liver Enzymes  Recent Labs Lab 04/01/16 1600 04/02/16 0406  AST 237* 328*  ALT 222* 359*  ALKPHOS 34* 36*  BILITOT 1.3* 0.8  ALBUMIN 2.8* 2.5*    Cardiac Enzymes No results for input(s): TROPONINI, PROBNP in the last 168 hours.  Glucose  Recent Labs Lab 04/01/16 2245 04/01/16 2358 04/02/16 0053 04/02/16 0155 04/02/16 0252 04/02/16 0357  GLUCAP 130* 83 124* 150* 138* 119*    Imaging Dg Chest Port 1 View  04/02/2016  CLINICAL DATA:  Hypoxia EXAM: PORTABLE CHEST 1 VIEW COMPARISON:  April 01, 2016 chest radiograph and chest CT March 31, 2016 FINDINGS: Endotracheal tube tip is 6.0 cm above the carina. Swan-Ganz catheter tip is in the proximal right main pulmonary artery. Intra-aortic balloon pump tip is at the level of T7 in the mid descending thoracic aortic region. There is a nasogastric tube with tip and side port below the diaphragm. There are bilateral chest tubes and a mediastinal drain. There is a right jugular catheter with tip in superior vena cava. No pneumothorax. There is a left pleural effusion with left lower lobe consolidation, marginally increased from 1 day prior. Right lung is clear except for mild atelectasis near the chest tube in the lower lobe region. There is  cardiomegaly. Prominence of the thoracic aorta is stable ; there is a known aortic dissection, documented on recent chest CT. No adenopathy evident. IMPRESSION: Tube and catheter positions as described. No pneumothorax evident. Persistent airspace consolidation left lower lobe with small left effusion. There may be slightly more consolidation in slightly larger effusion on the left compared to 1 day prior. Right lung is clear except for mild subsegmental atelectasis. Stable cardiac silhouette. Note enlargement of the thoracic aorta with known dissection, documented on recent chest CT. Electronically Signed   By: Lowella Grip III M.D.   On: 04/02/2016 07:27     STUDIES:    CULTURES: 04/01/16: MRSA nasal swab: Negative  ANTIBIOTICS: Cefuroxime: 04/01/16>>> Vancomycin: 04/01/16 >>>  SIGNIFICANT EVENTS:   LINES/TUBES: 03/31/16: ETT 03/31/16: R IJ PA cath 03/31/16: Foley 03/31/16: R IJ Double Lumen 03/31/16: R radial A line  03/31/16: 2 pleural CT  03/31/16: 2 mediastinal CT  DISCUSSION: 7/10: Consulted for critical management s/p replacement of ascending aorta with a hemashield platinum vascular graft.   ASSESSMENT / PLAN:  PULMONARY A: Acute Respiratory Failure with Hypoxemia Tobacco abuse  P:   Obtain accurate height per RT Decrease TV on vent to 8 cc/kg of IBW (72 inches 77.6 Kg IBW) Decrease FIO2 to 60 % and wean for sats > 92% SBT in AM Continue Lasix 40 mg BID Chlorhexidine for VAP prevention D/C Xopenex and Pulmicort nebs Albuterol nebs PRN  Repeat ABG at 1200 ABG and CXR in the AM   CARDIOVASCULAR A:  Hypotension  S/P repair of Type A aortic dissection P:  BP parameters per CT surgery Currently on milrinone, dopamine and amiodarone Balloon pump removed this AM per CT surgery Pacer in place, paced at 90  RENAL A:   AKI P:   Continue to monitor renal function BMP in AM Lasix 40 mg BID +1.5 liters for last 24 hours Replace electrolytes per  protocol  GASTROINTESTINAL A:   NPO P:   Pepcid for GI protection Colace for bowel regimen Zofran as needed for nausea/vomiting Consider TF if unable to extubate in the next 24 hours  HEMATOLOGIC A:   Anemia Thrombocytopenia P:  HgB 9.5 > 8 this am Transfusing 1 unit of PRBC Platelets 58 > received 1 unit of platelets Continue to monitor CBC > transfuse per CT surgery No signs of active bleeding Monitor CT output SCD for DVT prophylaxis No chemoprophylaxis at this time   INFECTIOUS A:   Surgical prophylaxis P:   Consider DC Vanc ( MRSA swab negative and AKI) Afebrile (Tmax 98.4) Mild Leukocytosis Continue to monitor fever curve  ENDOCRINE A:   Hyperglycemia P:   A1C pending Lantus 14 units BID (per CT surgery) Insulin gtt with Q1 hour blood sugars  NEUROLOGIC A:   No acute issues P:   RASS goal: 0- -1 Continue Fentanyl, Precedex and Versed for sedation Monitor for Neuro changes  Spontaneous awakening trial in AM  FAMILY  - Updates: No family present per nursing staff  - Inter-disciplinary family meet or Palliative Care meeting due by:  04/07/16   Jackalyn Lombard, NP 04/02/16   Attending Note:  I have examined patient, reviewed labs, studies and notes. I have discussed the case with P Hoffman and S Allred, and I agree with the data and plans as amended above. 30 s/p urgent repair of Type A dissection. He remains intubated and is requiring hemodynamic support, deep sedation. On my evaluation he is sedated, is requiring milrinone, dopa. Sedated with precedex + versed gtt's, fentanyl gtt. Lungs are coarse, B chest tubes in place without air leaks. Sternal wound dressing CDI. We will change him to [facility], [place] 8cc/kg. Will d/c the precedex until hs is closer to sedation and vent weaning. Continue PEEP 10 for now, decrease as able. Independent critical care time is 45 minutes.   Baltazar Apo, MD, PhD 04/02/2016, 11:39 AM  Pulmonary and Critical Care [phone] or if  no answer [phone]

## 2016-04-02 NOTE — Progress Notes (Signed)
Dr. Prescott Gum to bedside for morning rounds. When Dr. Prescott Gum began auscultating pt, pt became very agitated, requiring several staff members to intervene. Dr. Prescott Gum gave verbal order for vecuronium push. Benjaman Pott, RN gave 10 mg via Pyxis override. Orders written for PRN vecuronium. Will continue to monitor closely.  Sherlie Ban, RN 04/02/2016 8:15 AM

## 2016-04-02 NOTE — Progress Notes (Addendum)
Patient ID: Jon Rodriguez, male   DOB: [birth year], 55 y.o.   MRN: [id number] EVENING ROUNDS NOTE :     [address]       [place] [address]             [phone]                 2 Days Post-Op Procedure(s) (LRB): REPLACEMENT OF ASCENDING AORTA USING A 29mm HEMASHIELD PLATINUM VASCULAR GRAFT (N/A)  Total Length of Stay:  LOS: 2 days  BP 111/60 mmHg  Pulse 90  Temp(Src) 97.5 F (36.4 C) (Core (Comment))  Resp 18  Ht 6' (1.829 m)  Wt 317 lb 7.4 oz (144 kg)  BMI 43.05 kg/m2  SpO2 93%  .Intake/Output      07/09 0701 - 07/10 0700 07/10 0701 - 07/11 0700   I.V. (mL/kg) 2853.3 (19.8) 1249.3 (8.7)   Blood 539 693   NG/GT 140 50   IV Piggyback 1250 150   Total Intake(mL/kg) 4782.3 (33.2) 2142.3 (14.9)   Urine (mL/kg/hr) 1380 (0.4) 940 (0.6)   Emesis/NG output 375 (0.1)    Blood     Chest Tube 1425 (0.4) 215 (0.1)   Total Output 3180 1155   Net +1602.3 +987.3          . sodium chloride 20 mL/hr at 04/02/16 1700  . sodium chloride    . sodium chloride Stopped (04/02/16 0800)  . amiodarone 30 mg/hr (04/02/16 1700)  . DOPamine 2.5 mcg/kg/min (04/02/16 1700)  . EPINEPHrine 4 mg in dextrose 5% 250 mL infusion (16 mcg/mL) Stopped (04/02/16 0900)  . fentaNYL infusion INTRAVENOUS 250 mcg/hr (04/02/16 1700)  . insulin (NOVOLIN-R) infusion 3 Units/hr (04/02/16 1700)  . lactated ringers    . lactated ringers 20 mL/hr at 04/02/16 1700  . midazolam (VERSED) infusion 4 mg/hr (04/02/16 1700)  . milrinone 0.3 mcg/kg/min (04/02/16 1700)  . nitroGLYCERIN    . norepinephrine (LEVOPHED) Adult infusion 5 mcg/min (04/02/16 1700)     Lab Results  Component Value Date   WBC 11.0* 04/02/2016   HGB 7.1* 04/02/2016   HCT 21.0* 04/02/2016   PLT 66* 04/02/2016   GLUCOSE 104* 04/02/2016   ALT 359* 04/02/2016   AST 328* 04/02/2016   NA 141 04/02/2016   K 3.6 04/02/2016   CL 107 04/02/2016   CREATININE 1.20 04/02/2016   BUN 20 04/02/2016   CO2 27 04/02/2016   INR 1.35  04/02/2016   Cr 1.2 , uop good    plts and unit PRBC's  Given this am IAB removes today, groins ok, pedal pulses bilateral intact  Still on vent - unable to wean due to oxygenation   Grace Isaac MD  Beeper [phone] Office [phone] 04/02/2016 5:10 PM

## 2016-04-02 NOTE — Clinical Documentation Improvement (Signed)
  Cardiothoracic  Based on the clinical indicators below please clarify if any of the following conditions is clinically supported.   Abnormal Laboratory Results:  Creatinine on 7/10 - 1.53; 7/9 is 1.43; 7/8 - 1.33   Acute renal failure  Acute kidney injury  Acute tubular necrosis  Acute renal cortical necrosis  Acute renal medullary necrosis  Other  Clinically Undetermined   Supporting Information: See above   Please exercise your independent, professional judgment when responding. A specific answer is not anticipated or expected.   Thank You,  [place] [phone]

## 2016-04-02 NOTE — Progress Notes (Signed)
Anesthesiology Follow-up:  Restless on vent, following commands, moving all extremities.Requiring precedex, fentanyl, and versed infusion for sedation. Balloon pump just removed.  VS: T- 36.5 BP- 109/42 HR- 90 (a-paced)  PA- 33/17 CVP- 16  On dopamine and milrinone for inotropic support. Amiodarone started for PVCs   FiO2 0.7 TV- 800 PEEP-10 RR- 12  PH-7.42 PCO2- 38.5 PO2- 57  K-3.7 BUN/Cr. 21/1.53 glucose- 120 H/H- 8.0/22.4 platelets- 58,000 AST 328 ALT-34  55 year old male one day S/P Type 1 aortic dissection repair. Restless on vent, but appears to be neurologically intact. Does not appear to be ready for weaning yet.  Jon Rodriguez

## 2016-04-02 NOTE — Progress Notes (Signed)
IABP removed by Ellwood Handler, PA. Mild hematoma (Level 1) noted to site, PA aware. 6 hour bedrest period begins @ 1115. Will continue to carefully monitor patient.  Sherlie Ban, RN 04/02/2016 11:15 AM

## 2016-04-02 NOTE — Progress Notes (Signed)
Initial Nutrition Assessment  DOCUMENTATION CODES:   Morbid obesity  INTERVENTION:   If TF started, recommend Vital High Protein formula at goal rate of 60 ml/h (1440 ml per day) and Prostat 60 ml TID to provide 2016 kcals, 216 gm protein, 1204 ml free water daily.  NUTRITION DIAGNOSIS:   Inadequate oral intake related to inability to eat as evidenced by NPO status  GOAL:   Provide needs based on ASPEN/SCCM guidelines  MONITOR:   Vent status, Labs, Weight trends, I & O's  REASON FOR ASSESSMENT:   Ventilator  ASSESSMENT:   55 yo Male with morbid obesity, hypertension, and an active smoking history who suffered an acute type A ascending aortic dissection.  Patient s/p procedure 7/9: REPLACEMENT OF ASCENDING AORTA  ANTEGRADE CEREBRAL PERFUSION HYPOTHERMIC CIRCULATORY ARREST  Patient is currently intubated on ventilator support >> OGT in place Temp (24hrs), Avg:97.8 F (36.6 C), Min:97.2 F (36.2 C), Max:98.4 F (36.9 C)   No nutrition problems identified PTA. No muscle or subcutaneous fat depletion noticed. CCM note reviewed >> plan for spontaneous awakening trial in AM.  Diet Order:  Diet NPO time specified  Skin:  Reviewed, no issues  Last BM:  N/A  Height:   Ht Readings from Last 1 Encounters:  04/02/16 6' (1.829 m)    Weight:   Wt Readings from Last 1 Encounters:  04/02/16 317 lb 7.4 oz (144 kg)    Ideal Body Weight:  81 kg  BMI:  Body mass index is 43.05 kg/(m^2).  Estimated Nutritional Needs:   Kcal:  [id number]  Protein:  >/= 200 gm  Fluid:  per MD  EDUCATION NEEDS:   No education needs identified at this time  [name], RD, LDN Pager #: [phone] After-Hours Pager #: [phone]

## 2016-04-02 NOTE — Clinical Documentation Improvement (Signed)
Cardiothoracic  Based on the clinical indicators below please clarify if any of the following conditions is clinically supported.    Acute Blood Loss Anemia   Post Op (Acute) Blood Loss Anemia     Other  Clinically Undetermined  Please include the suspected or known cause or associated condition(s): went to surgery for dissecting thoracic aorta.   Supporting Information: Hgb currently 8.0.  Received multiple blood products in OR.    Please exercise your independent, professional judgment when responding. A specific answer is not anticipated or expected.   Thank You,  [place] [phone]

## 2016-04-02 NOTE — Addendum Note (Signed)
Addendum  created 04/02/16 1106 by Roberts Gaudy, MD   Modules edited: Notes Section   Notes Section:  File: [id number]; Pend: [id number]; Raelyn Number: [id number]; Raelyn Number: [id number]; Pend: [id number]

## 2016-04-02 NOTE — Progress Notes (Signed)
K+= 3.7 and creat= 1.50 w/ urine o/p > 30cc/hr; TCTS KCL protocol initiated with 10 mEq KCL in 50cc IV x 3, each over one hour.

## 2016-04-02 NOTE — Progress Notes (Signed)
Pt bedrest period over, left femoral site remains at level 1. Pt repositioned to semi-fowlers, HOB elevated >30 degrees. Remaining SCD applied to LLE. Will continue to monitor closely.  Sherlie Ban, RN 04/02/2016 6:00 PM

## 2016-04-02 NOTE — Progress Notes (Signed)
RT walked into room patient was waking up and trying to sit up. Several nurses already in room. RN pushing sedation, MD at bedside. Aline began not working and after assessment, RN noticed it catheter was out a little and kinked. RT undressed the catheter and reinserted it into the artery. Redressed with new bio patch and aline dressing. Line is secure, has good wave form and has good BP readings. Will cont to monitor

## 2016-04-03 ENCOUNTER — Inpatient Hospital Stay (HOSPITAL_COMMUNITY): Payer: 59

## 2016-04-03 LAB — COMPREHENSIVE METABOLIC PANEL
ALT: 817 U/L — ABNORMAL HIGH (ref 17–63)
AST: 604 U/L — ABNORMAL HIGH (ref 15–41)
Albumin: 2.4 g/dL — ABNORMAL LOW (ref 3.5–5.0)
Alkaline Phosphatase: 44 U/L (ref 38–126)
Anion gap: 3 — ABNORMAL LOW (ref 5–15)
BUN: 21 mg/dL — ABNORMAL HIGH (ref 6–20)
CO2: 27 mmol/L (ref 22–32)
Calcium: 7.3 mg/dL — ABNORMAL LOW (ref 8.9–10.3)
Chloride: 108 mmol/L (ref 101–111)
Creatinine, Ser: 1.33 mg/dL — ABNORMAL HIGH (ref 0.61–1.24)
GFR calc Af Amer: >60 mL/min (ref >60)
GFR calc non Af Amer: 59 mL/min — ABNORMAL LOW (ref >60)
Glucose, Bld: 106 mg/dL — ABNORMAL HIGH (ref 65–99)
Potassium: 4.2 mmol/L (ref 3.5–5.1)
Sodium: 138 mmol/L (ref 135–145)
Total Bilirubin: 0.9 mg/dL (ref 0.3–1.2)
Total Protein: 4.1 g/dL — ABNORMAL LOW (ref 6.5–8.1)

## 2016-04-03 LAB — POCT I-STAT 3, ART BLOOD GAS (G3+)
Acid-Base Excess: 2 mmol/L (ref 0.0–2.0)
Acid-Base Excess: 2 mmol/L (ref 0.0–2.0)
Bicarbonate: 27.1 mEq/L — ABNORMAL HIGH (ref 20.0–24.0)
Bicarbonate: 25.7 mEq/L — ABNORMAL HIGH (ref 20.0–24.0)
O2 Saturation: 92 %
O2 Saturation: 98 %
Patient temperature: 37.4
Patient temperature: 36.8
TCO2: 28 mmol/L (ref 0–100)
TCO2: 27 mmol/L (ref 0–100)
pCO2 arterial: 43.8 mmHg (ref 35.0–45.0)
pCO2 arterial: 36.3 mmHg (ref 35.0–45.0)
pH, Arterial: 7.402 (ref 7.350–7.450)
pH, Arterial: 7.456 — ABNORMAL HIGH (ref 7.350–7.450)
pO2, Arterial: 65 mmHg — ABNORMAL LOW (ref 80.0–100.0)
pO2, Arterial: 107 mmHg — ABNORMAL HIGH (ref 80.0–100.0)

## 2016-04-03 LAB — POCT I-STAT, CHEM 8
BUN: 20 mg/dL (ref 6–20)
Calcium, Ion: 1.13 mmol/L (ref 1.13–1.30)
Chloride: 103 mmol/L (ref 101–111)
Creatinine, Ser: 1.1 mg/dL (ref 0.61–1.24)
Glucose, Bld: 123 mg/dL — ABNORMAL HIGH (ref 65–99)
HCT: 21 % — ABNORMAL LOW (ref 39.0–52.0)
Hemoglobin: 7.1 g/dL — ABNORMAL LOW (ref 13.0–17.0)
Potassium: 3.6 mmol/L (ref 3.5–5.1)
Sodium: 138 mmol/L (ref 135–145)
TCO2: 27 mmol/L (ref 0–100)

## 2016-04-03 LAB — GLUCOSE, CAPILLARY
Glucose-Capillary: 102 mg/dL — ABNORMAL HIGH (ref 65–99)
Glucose-Capillary: 103 mg/dL — ABNORMAL HIGH (ref 65–99)
Glucose-Capillary: 104 mg/dL — ABNORMAL HIGH (ref 65–99)
Glucose-Capillary: 104 mg/dL — ABNORMAL HIGH (ref 65–99)
Glucose-Capillary: 104 mg/dL — ABNORMAL HIGH (ref 65–99)
Glucose-Capillary: 105 mg/dL — ABNORMAL HIGH (ref 65–99)
Glucose-Capillary: 105 mg/dL — ABNORMAL HIGH (ref 65–99)
Glucose-Capillary: 105 mg/dL — ABNORMAL HIGH (ref 65–99)
Glucose-Capillary: 106 mg/dL — ABNORMAL HIGH (ref 65–99)
Glucose-Capillary: 107 mg/dL — ABNORMAL HIGH (ref 65–99)
Glucose-Capillary: 107 mg/dL — ABNORMAL HIGH (ref 65–99)
Glucose-Capillary: 108 mg/dL — ABNORMAL HIGH (ref 65–99)
Glucose-Capillary: 111 mg/dL — ABNORMAL HIGH (ref 65–99)
Glucose-Capillary: 112 mg/dL — ABNORMAL HIGH (ref 65–99)
Glucose-Capillary: 115 mg/dL — ABNORMAL HIGH (ref 65–99)
Glucose-Capillary: 118 mg/dL — ABNORMAL HIGH (ref 65–99)
Glucose-Capillary: 118 mg/dL — ABNORMAL HIGH (ref 65–99)
Glucose-Capillary: 119 mg/dL — ABNORMAL HIGH (ref 65–99)
Glucose-Capillary: 120 mg/dL — ABNORMAL HIGH (ref 65–99)
Glucose-Capillary: 128 mg/dL — ABNORMAL HIGH (ref 65–99)
Glucose-Capillary: 128 mg/dL — ABNORMAL HIGH (ref 65–99)
Glucose-Capillary: 129 mg/dL — ABNORMAL HIGH (ref 65–99)

## 2016-04-03 LAB — CBC
HCT: 23.4 % — ABNORMAL LOW (ref 39.0–52.0)
Hemoglobin: 8 g/dL — ABNORMAL LOW (ref 13.0–17.0)
MCH: 31.9 pg (ref 26.0–34.0)
MCHC: 34.2 g/dL (ref 30.0–36.0)
MCV: 93.2 fL (ref 78.0–100.0)
Platelets: 66 10*3/uL — ABNORMAL LOW (ref 150–400)
RBC: 2.51 MIL/uL — ABNORMAL LOW (ref 4.22–5.81)
RDW: 14.7 % (ref 11.5–15.5)
WBC: 11.4 10*3/uL — ABNORMAL HIGH (ref 4.0–10.5)

## 2016-04-03 LAB — CARBOXYHEMOGLOBIN
Carboxyhemoglobin: 1.3 % (ref 0.5–1.5)
METHEMOGLOBIN: 0.9 % (ref 0.0–1.5)
O2 Saturation: 65.6 %
Total hemoglobin: 7.8 g/dL — ABNORMAL LOW (ref 13.5–18.0)

## 2016-04-03 LAB — PREPARE PLATELET PHERESIS: Unit division: 0

## 2016-04-03 LAB — HEMOGLOBIN A1C
Hgb A1c MFr Bld: 5 % (ref 4.8–5.6)
Mean Plasma Glucose: 97 mg/dL

## 2016-04-03 MED ORDER — INSULIN ASPART 100 UNIT/ML ~~LOC~~ SOLN
0.0000 [IU] | SUBCUTANEOUS | Status: DC
  Administered 2016-04-03 – 2016-04-06 (×6): 2 [IU] via SUBCUTANEOUS

## 2016-04-03 MED ORDER — INSULIN GLARGINE 100 UNIT/ML ~~LOC~~ SOLN
20.0000 [IU] | Freq: Two times a day (BID) | SUBCUTANEOUS | Status: DC
  Administered 2016-04-03 – 2016-04-06 (×7): 20 [IU] via SUBCUTANEOUS
  Filled 2016-04-03 (×10): qty 0.2

## 2016-04-03 MED ORDER — PNEUMOCOCCAL VAC POLYVALENT 25 MCG/0.5ML IJ INJ: 0.5000 mL | INJECTION | INTRAMUSCULAR | Status: DC | PRN

## 2016-04-03 MED ORDER — DEXTROSE 5 % IV SOLN
1.0000 g | Freq: Three times a day (TID) | INTRAVENOUS | Status: DC
  Administered 2016-04-03 – 2016-04-07 (×13): 1 g via INTRAVENOUS
  Filled 2016-04-03 (×14): qty 1

## 2016-04-03 MED ORDER — FUROSEMIDE 10 MG/ML IJ SOLN
15.0000 mg/h | INTRAVENOUS | Status: DC
  Administered 2016-04-03 – 2016-04-04 (×2): 10 mg/h via INTRAVENOUS
  Filled 2016-04-03 (×4): qty 25

## 2016-04-03 MED ORDER — QUETIAPINE FUMARATE 25 MG PO TABS
50.0000 mg | ORAL_TABLET | Freq: Two times a day (BID) | ORAL | Status: DC
  Administered 2016-04-03 – 2016-04-06 (×7): 50 mg via ORAL
  Filled 2016-04-03 (×7): qty 2

## 2016-04-03 MED ORDER — HALOPERIDOL LACTATE 5 MG/ML IJ SOLN
5.0000 mg | INTRAMUSCULAR | Status: DC | PRN
  Administered 2016-04-03: 5 mg via INTRAVENOUS
  Filled 2016-04-03: qty 1

## 2016-04-03 MED ORDER — THIAMINE HCL 100 MG/ML IJ SOLN
100.0000 mg | Freq: Every day | INTRAMUSCULAR | Status: DC
  Administered 2016-04-03 – 2016-04-07 (×5): 100 mg via INTRAVENOUS
  Filled 2016-04-03 (×5): qty 2

## 2016-04-03 MED ORDER — CLONAZEPAM 1 MG PO TABS
1.0000 mg | ORAL_TABLET | Freq: Two times a day (BID) | ORAL | Status: DC
  Administered 2016-04-03 – 2016-04-06 (×7): 1 mg via ORAL
  Filled 2016-04-03 (×7): qty 1

## 2016-04-03 MED ORDER — POTASSIUM CHLORIDE 10 MEQ/50ML IV SOLN
10.0000 meq | INTRAVENOUS | Status: AC
  Administered 2016-04-03 (×3): 10 meq via INTRAVENOUS
  Filled 2016-04-03 (×3): qty 50

## 2016-04-03 NOTE — Progress Notes (Signed)
TCTS BRIEF SICU PROGRESS NOTE  3 Days Post-Op  S/P Procedure(s) (LRB): REPLACEMENT OF ASCENDING AORTA USING A 27mm HEMASHIELD PLATINUM VASCULAR GRAFT (N/A)   Sedated on vent NSR w/ stable hemodynamics but still on multiple pressors O2 sats 100% on 60% FiO2 Excellent UOP Labs okay  Plan: Continue current plan  Rexene Alberts, MD 04/03/2016 6:09 PM

## 2016-04-03 NOTE — Progress Notes (Signed)
3 Days Post-Op Procedure(s) (LRB): REPLACEMENT OF ASCENDING AORTA USING A 33mm HEMASHIELD PLATINUM VASCULAR GRAFT (N/A) Subjective: Status post repair of acute type A aortic dissection Postoperative RV dysfunction treated with balloon pump now removed Postoperative acute respiratory failure requiring high oxygen requirement and PEEP, improving slowly and followed by CCM Postoperative delirium requiring heavy sedation protocol Patient has moved purposefully all extremities and has peripheral pulses intact Objective: Vital signs in last 24 hours: Temp:  [97.9 F (36.6 C)-101.3 F (38.5 C)] 97.9 F (36.6 C) (07/11 1815) Pulse Rate:  [88-113] 89 (07/11 1815) Cardiac Rhythm:  [-] Atrial paced (07/11 1200) Resp:  [13-21] 16 (07/11 1815) BP: (66-134)/(39-80) 116/57 mmHg (07/11 1800) SpO2:  [88 %-100 %] 100 % (07/11 1815) FiO2 (%):  [60 %-70 %] 60 % (07/11 1631) Weight:  [295 lb 6.7 oz (134 kg)] 295 lb 6.7 oz (134 kg) (07/11 0500)  Hemodynamic parameters for last 24 hours: PAP: (23-44)/(11-28) 29/16 mmHg CVP:  [5 mmHg-25 mmHg] 10 mmHg CO:  [8.4 L/min-11.7 L/min] 9.9 L/min CI:  [3.4 L/min/m2-4.7 L/min/m2] 4 L/min/m2  Intake/Output from previous day: 07/10 0701 - 07/11 0700 In: 4837.4 [I.V.:3484.4; Blood:693; NG/GT:210; IV Piggyback:450] Out: 2855 [Urine:1570; Emesis/NG output:700; Chest Tube:585] Intake/Output this shift: Total I/O In: 2069.7 [I.V.:1809.7; NG/GT:60; IV Piggyback:200] Out: 2210 [Urine:1610; Emesis/NG output:300; Chest Tube:300]  Sedated on ventilator Coarse breath sounds Mild to moderate peripheral edema Extremities warm with pulses Abdomen nontender nondistended  Lab Results:  Recent Labs  04/02/16 1600  04/03/16 0402 04/03/16 1604  WBC 11.0*  --  11.4*  --   HGB 8.4*  < > 8.0* 7.1*  HCT 24.5*  < > 23.4* 21.0*  PLT 66*  --  66*  --   < > = values in this interval not displayed. BMET:  Recent Labs  04/02/16 0406  04/03/16 0402 04/03/16 1604  NA 141   < > 138 138  K 3.7  < > 4.2 3.6  CL 110  < > 108 103  CO2 27  --  27  --   GLUCOSE 120*  < > 106* 123*  BUN 21*  < > 21* 20  CREATININE 1.53*  < > 1.33* 1.10  CALCIUM 7.7*  --  7.3*  --   < > = values in this interval not displayed.  PT/INR:  Recent Labs  04/02/16 0406  LABPROT 16.8*  INR 1.35   ABG    Component Value Date/Time   PHART 7.456* 04/03/2016 1604   HCO3 25.7* 04/03/2016 1604   TCO2 27 04/03/2016 1604   TCO2 27 04/03/2016 1604   ACIDBASEDEF 2.0 04/01/2016 1205   O2SAT 98.0 04/03/2016 1604   CBG (last 3)   Recent Labs  04/03/16 0855 04/03/16 0949 04/03/16 1054  GLUCAP 102* 103* 115*    Assessment/Plan: S/P Procedure(s) (LRB): REPLACEMENT OF ASCENDING AORTA USING A 21mm HEMASHIELD PLATINUM VASCULAR GRAFT (N/A) Patient not ready for ventilator wean until oxygenation is improved Lasix infusion started Fever within the past 24 hours treated with cultures and empiric antibiotics   LOS: 3 days    Tharon Aquas Trigt III 04/03/2016

## 2016-04-03 NOTE — Progress Notes (Signed)
PULMONARY / CRITICAL CARE MEDICINE   Name: Jon Rodriguez MRN: [id number] DOB: 11-Sep-1961    ADMISSION DATE:  03/31/2016 CONSULTATION DATE:  04/02/2016  REFERRING MD: Dr. Prescott Gum  CHIEF COMPLAINT:  Ventilator and critical care management  HISTORY OF PRESENT ILLNESS:   55 year old morbidly obese nondiabetic male with history of treated hypertension and moderate smoking felt sudden tearing chest pain at 11 AM today while working out at a fitness center- gym. This did not resolve and he presented to the emergency department. EKG was nonspecific and trach enzymes were first negative and minimally elevated. CT scan rule out pulmonary embolus was negative. The patient was then re-dosed with contrast for a CTA angiogram and this demonstrates a tear in the ascending aorta extending through the arch and descending thoracic aorta down to the aortic abdominal bifurcation. The innominate artery is not involved by the dissection, the left carotid is not involved, the left subclavian artery appears to be involved with the dissection. The renal and visceral vessels come off the true lumen. The ascending aorta and arch are dilated. The descending thoracic aorta appears to be of normal caliber. There is no clear pericardial effusion. 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 He is currently requiring high oxygen demands as well as high needs for sedation.   SUBJECTIVE:  Consulted for critical care management s/p replacement of ascending aorta with a hemashield platinum vascular graft.   VITAL SIGNS: BP 113/55 mmHg  Pulse 89  Temp(Src) 98.2 F (36.8 C) (Core (Comment))  Resp 18  Ht 6' (1.829 m)  Wt 134 kg (295 lb 6.7 oz)  BMI 40.06 kg/m2  SpO2 100%  HEMODYNAMICS: PAP: (23-43)/(11-28) 34/18 mmHg CVP:  [5 mmHg-25 mmHg] 11 mmHg CO:  [6.8 L/min-11.7 L/min] 8.4 L/min CI:  [2.7 L/min/m2-4.7 L/min/m2] 3.4 L/min/m2  VENTILATOR SETTINGS: Vent  Mode:  [-] PRVC FiO2 (%):  [60 %-70 %] 70 % Set Rate:  [10 bmp-18 bmp] 18 bmp Vt Set:  [620 mL] 620 mL PEEP:  [5 cmH20-10 cmH20] 10 cmH20 Plateau Pressure:  [22 cmH20-23 cmH20] 22 cmH20  INTAKE / OUTPUT: I/O last 3 completed shifts: In: 7213.4 [I.V.:5300.4; Blood:693; NG/GT:320; IV [id number] Out: [id number] [Urine:2220; Emesis/NG output:1075; Chest Tube:1240]  PHYSICAL EXAMINATION: General:  Morbidly obese gentleman intubated and sedated.  Neuro:  Sedated on Fentanyl and Versed, GSC 11T, eyes opened spontaneously, followed commands on LUE and RLE HEENT: Moist mucous membranes. Intubated Cardiovascular: S1S2, no murmur Paced at a rate 90 Lungs: Coarse through out. Mediastinal chest tubes in place.  Abdomen: Obese, hypoactive bowel sounds.  Musculoskeletal: Sedated, but will wake up and move all extremities Skin: Sternotomy incision, covered with Aquacel  LABS:  BMET  Recent Labs Lab 04/01/16 1600 04/02/16 0406 04/02/16 1600 04/02/16 1605 04/03/16 0402  NA 142 141  --  141 138  K 3.9 3.7  --  3.6 4.2  CL 112* 110  --  107 108  CO2 27 27  --   --  27  BUN 19 21*  --  20 21*  CREATININE 1.53* 1.53* 1.26* 1.20 1.33*  GLUCOSE 84 120*  --  104* 106*    Electrolytes  Recent Labs Lab 04/01/16 0450 04/01/16 0955 04/01/16 1600 04/02/16 0406 04/02/16 1600 04/03/16 0402  CALCIUM 7.8*  --  8.1* 7.7*  --  7.3*  MG 2.0 2.9*  --   --  2.2  --     CBC  Recent Labs Lab 04/02/16 0406 04/02/16 1600 04/02/16 1605 04/03/16 0402  WBC 12.1* 11.0*  --  11.4*  HGB 8.0* 8.4* 7.1* 8.0*  HCT 22.4* 24.5* 21.0* 23.4*  PLT 58* 66*  --  66*    Coag's  Recent Labs Lab 03/31/16 1702 04/01/16 0450 04/01/16 0955 04/02/16 0406  APTT 33 53*  --  35  INR 1.12 1.13 1.18 1.35    Sepsis Markers No results for input(s): LATICACIDVEN, PROCALCITON, O2SATVEN in the last 168 hours.  ABG  Recent Labs Lab 04/02/16 0907 04/02/16 1206 04/03/16 0359  PHART 7.420 7.475* 7.402   PCO2ART 38.5 36.6 43.8  PO2ART 57.0* 48.0* 65.0*    Liver Enzymes  Recent Labs Lab 04/01/16 1600 04/02/16 0406 04/03/16 0402  AST 237* 328* 604*  ALT 222* 359* 817*  ALKPHOS 34* 36* 44  BILITOT 1.3* 0.8 0.9  ALBUMIN 2.8* 2.5* 2.4*    Cardiac Enzymes No results for input(s): TROPONINI, PROBNP in the last 168 hours.  Glucose  Recent Labs Lab 04/03/16 0102 04/03/16 0205 04/03/16 0312 04/03/16 0356 04/03/16 0502 04/03/16 0600  GLUCAP 104* 108* 118* 104* 120* 119*    Imaging Dg Chest Port 1 View  04/03/2016  CLINICAL DATA:  Intubated patient, status post ascending aortic aneurysm repair. EXAM: PORTABLE CHEST 1 VIEW COMPARISON:  Portable chest x-ray of April 02, 2016 FINDINGS: The lungs are reasonably well inflated. Bilateral basilar chest tubes remain as does a mediastinal drain. The cardiac silhouette remains enlarged. The pulmonary vascularity is not clearly engorged. There is subsegmental atelectasis in the left perihilar and right infrahilar region that is stable. The endotracheal tube tip lies 4.4 cm above the carina. The esophagogastric tube tip projects below the inferior margin of the image. The Swan-Ganz catheter tip projects in the proximal main pulmonary artery outflow tract a second right internal jugular venous catheter tip projects over the proximal SVC. The mediastinum remains widened but is slightly less conspicuous today due in part due to changes in patient positioning. IMPRESSION: 1. Improving left lower lobe atelectasis. No significant pleural effusion or pneumothorax. 2. Cardiomegaly with mediastinal prominence, slightly improved. No significant pulmonary interstitial edema 3. The support tubes are in reasonable position. Electronically Signed   By: David  Martinique M.D.   On: 04/03/2016 08:04     STUDIES:    CULTURES: 04/01/16: MRSA nasal swab: Negative 04/02/16: Blood Cultures: pending 04/03/16: Sputum Culture: No organisms on  stain   ANTIBIOTICS: Cefuroxime: 04/01/16>>> 04/03/16 Vancomycin: 04/01/16 >>> 04/03/16 Ceftazidime: 04/03/16 >>>   SIGNIFICANT EVENTS:   LINES/TUBES: 03/31/16: ETT 03/31/16: R IJ PA cath 03/31/16: Foley 03/31/16: R IJ Double Lumen 03/31/16: R radial A line 03/31/16: 2 pleural CT  03/31/16: 2 mediastinal CT  DISCUSSION: 7/10: Consulted for critical care management s/p replacement of ascending aorta with a hemashield platinum vascular graft. 7/11:  Unable to wean vent this AM, will recheck ABG this afternoon and attempt to wean. Fever spiked overnight and pan cultured. Started on ceftazidine overnight. Tolerating vent on current sedation.   ASSESSMENT / PLAN:  PULMONARY A: Acute Respiratory Failure with Hypoxemia Tobacco abuse  P:   Continue TV on vent to 8 cc/kg of IBW (72 inches 77.6 Kg IBW) No change in Vent settings this AM - PaO2 65 on AM ABG Recheck ABG at 1400 and in AM CXR in AM SBT in AM Lasix at 10 mg/hr (started AM 7/11) Chlorhexidine for VAP prevention Albuterol nebs PRN    CARDIOVASCULAR A:  Hypotension  S/P repair of  Type A aortic dissection P:  BP parameters per CT surgery Currently on milrinone, dopamine, levophed, vasopressin and amiodarone Pacer in place, paced at 90 CO: 8.9/ CI : 3.1 this AM CVP 16  RENAL A:   AKI P:   Continue to monitor renal function BMP in AM Lasix at 10 mg/hr (started AM 7/11) +1.9 liters for last 24 hours Replace electrolytes per protocol  GASTROINTESTINAL A:   NPO P:   Pepcid for GI protection Colace for bowel regimen Zofran as needed for nausea/vomiting Consider TF if unable to extubate in the next 24 hours Liver enzymes elevated this AM - AST 328 > 604 and ALT 359 > 817   HEMATOLOGIC A:   Anemia Thrombocytopenia P:  HgB 8 this AM Continue to monitor CBC > transfuse per CT surgery No signs of active bleeding Monitor CT output SCD for DVT prophylaxis No chemoprophylaxis at this time   INFECTIOUS A:   Surgical  prophylaxis Fever P:   Tmax 101.3 overnight Pan cultured Ceftazidime started overnight Mild Leukocytosis Continue to monitor fever curve  ENDOCRINE A:   Hyperglycemia P:   A1C pending Lantus 20 units BID (per CT surgery) Insulin gtt off this AM  NEUROLOGIC A:   Sedation for vent tolerance P:   RASS goal: 0- -1 Continue Fentanyl and Versed for sedation Monitor for Neuro changes  Spontaneous awakening trial in AM  FAMILY  - Updates: No family present per nursing staff  - Inter-disciplinary family meet or Palliative Care meeting due by:  04/07/16   Attending Note:  I have examined patient, reviewed labs, studies and notes. I have discussed the case with Jaclynn Guarneri, and I agree with the data and plans as amended above. 55 yo man hx HTN and hyperlipidemia, s/p urgent repair of Type A dissection on 7/10. He remains intubated and is requiring hemodynamic support, deep sedation. On my evaluation he wakes to voice, opens eyes. Sedated with precedex + versed gtt's, fentanyl gtt. Lungs are coarse, B chest tubes in place without air leaks. Sternal wound dressing CDI. Remains on pressors. His vent settings have not improved last 24h > Fio2 70%, PEEP 10. Suspect both cardiac and pulm contributions to his hypoxia. Lasix gtt started, will follow outpt. Will follow ABG (SaO2 on gas consistently lower than SpO2).   Independent critical care time is 33 minutes.   Baltazar Apo, MD, PhD 04/03/2016, 3:24 PM [facility] [phone] or if no answer [phone]

## 2016-04-03 NOTE — Progress Notes (Addendum)
Jaclynn Guarneri, NP notified of pt's carboxyhemoglobin results. No new orders received at this time. Will continue to monitor.  Sherlie Ban, RN 04/03/2016 12:47 PM

## 2016-04-03 NOTE — Progress Notes (Signed)
Pt continues to be agitated, moving LEs off bed, unable to sedate. Fentanyl gtt increased to 400 mcgs. Will continue to monitor carefully.  Sherlie Ban, RN 04/03/2016 12:54 PM

## 2016-04-04 ENCOUNTER — Inpatient Hospital Stay (HOSPITAL_COMMUNITY): Payer: 59

## 2016-04-04 LAB — COMPREHENSIVE METABOLIC PANEL
ALT: 605 U/L — ABNORMAL HIGH (ref 17–63)
AST: 230 U/L — ABNORMAL HIGH (ref 15–41)
Albumin: 2.2 g/dL — ABNORMAL LOW (ref 3.5–5.0)
Alkaline Phosphatase: 45 U/L (ref 38–126)
Anion gap: 4 — ABNORMAL LOW (ref 5–15)
BUN: 19 mg/dL (ref 6–20)
CO2: 28 mmol/L (ref 22–32)
Calcium: 7.4 mg/dL — ABNORMAL LOW (ref 8.9–10.3)
Chloride: 104 mmol/L (ref 101–111)
Creatinine, Ser: 1.15 mg/dL (ref 0.61–1.24)
GFR calc Af Amer: >60 mL/min (ref >60)
GFR calc non Af Amer: >60 mL/min (ref >60)
Glucose, Bld: 134 mg/dL — ABNORMAL HIGH (ref 65–99)
Potassium: 3.3 mmol/L — ABNORMAL LOW (ref 3.5–5.1)
Sodium: 136 mmol/L (ref 135–145)
Total Bilirubin: 1.1 mg/dL (ref 0.3–1.2)
Total Protein: 4.3 g/dL — ABNORMAL LOW (ref 6.5–8.1)

## 2016-04-04 LAB — POCT I-STAT 3, ART BLOOD GAS (G3+)
Acid-Base Excess: 4 mmol/L — ABNORMAL HIGH (ref 0.0–2.0)
Acid-Base Excess: 3 mmol/L — ABNORMAL HIGH (ref 0.0–2.0)
Bicarbonate: 28.1 mEq/L — ABNORMAL HIGH (ref 20.0–24.0)
Bicarbonate: 28.9 mEq/L — ABNORMAL HIGH (ref 20.0–24.0)
O2 Saturation: 95 %
O2 Saturation: 99 %
Patient temperature: 36
Patient temperature: 35.9
TCO2: 29 mmol/L (ref 0–100)
TCO2: 30 mmol/L (ref 0–100)
pCO2 arterial: 39.6 mmHg (ref 35.0–45.0)
pCO2 arterial: 43.5 mmHg (ref 35.0–45.0)
pH, Arterial: 7.456 — ABNORMAL HIGH (ref 7.350–7.450)
pH, Arterial: 7.425 (ref 7.350–7.450)
pO2, Arterial: 68 mmHg — ABNORMAL LOW (ref 80.0–100.0)
pO2, Arterial: 151 mmHg — ABNORMAL HIGH (ref 80.0–100.0)

## 2016-04-04 LAB — TYPE AND SCREEN
ABO/RH(D): B NEG
ANTIBODY SCREEN: NEGATIVE
UNIT DIVISION: 0
UNIT DIVISION: 0
UNIT DIVISION: 0
UNIT DIVISION: 0
Unit division: 0
Unit division: 0
Unit division: 0
Unit division: 0

## 2016-04-04 LAB — GLUCOSE, CAPILLARY
Glucose-Capillary: 133 mg/dL — ABNORMAL HIGH (ref 65–99)
Glucose-Capillary: 104 mg/dL — ABNORMAL HIGH (ref 65–99)
Glucose-Capillary: 128 mg/dL — ABNORMAL HIGH (ref 65–99)
Glucose-Capillary: 77 mg/dL (ref 65–99)
Glucose-Capillary: 97 mg/dL (ref 65–99)

## 2016-04-04 LAB — CBC
HCT: 21.3 % — ABNORMAL LOW (ref 39.0–52.0)
Hemoglobin: 7.3 g/dL — ABNORMAL LOW (ref 13.0–17.0)
MCH: 32 pg (ref 26.0–34.0)
MCHC: 34.3 g/dL (ref 30.0–36.0)
MCV: 93.4 fL (ref 78.0–100.0)
Platelets: 62 10*3/uL — ABNORMAL LOW (ref 150–400)
RBC: 2.28 MIL/uL — ABNORMAL LOW (ref 4.22–5.81)
RDW: 14.7 % (ref 11.5–15.5)
WBC: 7.8 10*3/uL (ref 4.0–10.5)

## 2016-04-04 LAB — POCT I-STAT, CHEM 8
BUN: 21 mg/dL — ABNORMAL HIGH (ref 6–20)
Calcium, Ion: 1.16 mmol/L (ref 1.13–1.30)
Chloride: 99 mmol/L — ABNORMAL LOW (ref 101–111)
Creatinine, Ser: 1.2 mg/dL (ref 0.61–1.24)
Glucose, Bld: 92 mg/dL (ref 65–99)
HCT: 19 % — ABNORMAL LOW (ref 39.0–52.0)
Hemoglobin: 6.5 g/dL — CL (ref 13.0–17.0)
Potassium: 3.2 mmol/L — ABNORMAL LOW (ref 3.5–5.1)
Sodium: 139 mmol/L (ref 135–145)
TCO2: 29 mmol/L (ref 0–100)

## 2016-04-04 LAB — URINE CULTURE: Culture: NO GROWTH

## 2016-04-04 LAB — HEMOGLOBIN AND HEMATOCRIT, BLOOD
HCT: 22.1 % — ABNORMAL LOW (ref 39.0–52.0)
Hemoglobin: 7.6 g/dL — ABNORMAL LOW (ref 13.0–17.0)

## 2016-04-04 LAB — PREPARE RBC (CROSSMATCH)

## 2016-04-04 MED ORDER — POTASSIUM CHLORIDE 10 MEQ/50ML IV SOLN
10.0000 meq | INTRAVENOUS | Status: AC | PRN
  Administered 2016-04-04 (×3): 10 meq via INTRAVENOUS
  Filled 2016-04-04 (×3): qty 50

## 2016-04-04 MED ORDER — METOLAZONE 5 MG PO TABS
5.0000 mg | ORAL_TABLET | Freq: Every day | ORAL | Status: DC
  Administered 2016-04-05 – 2016-04-06 (×2): 5 mg via ORAL
  Filled 2016-04-04 (×2): qty 1

## 2016-04-04 MED ORDER — POTASSIUM CHLORIDE 10 MEQ/50ML IV SOLN
10.0000 meq | INTRAVENOUS | Status: AC
  Administered 2016-04-04 (×3): 10 meq via INTRAVENOUS
  Filled 2016-04-04: qty 50

## 2016-04-04 MED ORDER — JEVITY 1.5 CAL/FIBER PO LIQD
1000.0000 mL | ORAL | Status: DC
  Administered 2016-04-04: 1000 mL
  Filled 2016-04-04: qty 1000

## 2016-04-04 MED ORDER — POTASSIUM CHLORIDE 10 MEQ/50ML IV SOLN
10.0000 meq | INTRAVENOUS | Status: AC
  Administered 2016-04-04 (×2): 10 meq via INTRAVENOUS
  Filled 2016-04-04: qty 50

## 2016-04-04 MED ORDER — JEVITY 1.5 CAL/FIBER PO LIQD
1000.0000 mL | ORAL | Status: DC
  Administered 2016-04-04: 1000 mL
  Filled 2016-04-04 (×2): qty 1000

## 2016-04-04 MED ORDER — METOCLOPRAMIDE HCL 5 MG/ML IJ SOLN
10.0000 mg | Freq: Four times a day (QID) | INTRAMUSCULAR | Status: DC
  Administered 2016-04-04 – 2016-04-07 (×14): 10 mg via INTRAVENOUS
  Filled 2016-04-04 (×14): qty 2

## 2016-04-04 MED ORDER — JEVITY 1.5 CAL/FIBER PO LIQD
1000.0000 mL | ORAL | Status: DC
  Filled 2016-04-04 (×2): qty 1000

## 2016-04-04 MED FILL — Heparin Sodium (Porcine) Inj 1000 Unit/ML: INTRAMUSCULAR | Qty: 30 | Status: AC

## 2016-04-04 MED FILL — Potassium Chloride Inj 2 mEq/ML: INTRAVENOUS | Qty: 40 | Status: AC

## 2016-04-04 MED FILL — Magnesium Sulfate Inj 50%: INTRAMUSCULAR | Qty: 10 | Status: AC

## 2016-04-04 NOTE — Progress Notes (Signed)
Patient ID: Jon Rodriguez, male   DOB: 08-Jun-1961, 55 y.o.   MRN: [id number] EVENING ROUNDS NOTE :     [place]       [place] [address]             [phone]                 4 Days Post-Op Procedure(s) (LRB): REPLACEMENT OF ASCENDING AORTA USING A 90mm HEMASHIELD PLATINUM VASCULAR GRAFT (N/A)  Total Length of Stay:  LOS: 4 days  BP 81/47 mmHg  Pulse 89  Temp(Src) 96.6 F (35.9 C) (Core (Comment))  Resp 14  Ht 6' (1.829 m)  Wt 297 lb 9.9 oz (135 kg)  BMI 40.36 kg/m2  SpO2 100%  .Intake/Output      07/11 0701 - 07/12 0700 07/12 0701 - 07/13 0700   I.V. (mL/kg) 3431.1 (25.4) 1202.3 (8.9)   Blood  335   NG/GT 60 275   IV Piggyback 300 250   Total Intake(mL/kg) 3791.1 (28.1) 2062.3 (15.3)   Urine (mL/kg/hr) 3260 (1) 980 (0.7)   Emesis/NG output 700 (0.2)    Chest Tube 580 (0.2) 120 (0.1)   Total Output 4540 1100   Net -748.9 +962.3          . sodium chloride 20 mL/hr at 04/04/16 1100  . sodium chloride    . sodium chloride 10 mL/hr at 04/03/16 2134  . amiodarone 30 mg/hr (04/04/16 1100)  . EPINEPHrine 4 mg in dextrose 5% 250 mL infusion (16 mcg/mL) Stopped (04/02/16 0900)  . fentaNYL infusion INTRAVENOUS 150 mcg/hr (04/04/16 1600)  . furosemide (LASIX) infusion 15 mg/hr (04/04/16 1731)  . lactated ringers    . lactated ringers 10 mL/hr at 04/04/16 1100  . midazolam (VERSED) infusion 2 mg/hr (04/04/16 1150)  . milrinone 0.125 mcg/kg/min (04/04/16 1100)  . norepinephrine (LEVOPHED) Adult infusion 2 mcg/min (04/04/16 1600)  . vasopressin (PITRESSIN) infusion - *FOR SHOCK* 0.01 Units/min (04/04/16 1100)     Lab Results  Component Value Date   WBC 7.8 04/04/2016   HGB 7.6* 04/04/2016   HCT 22.1* 04/04/2016   PLT 62* 04/04/2016   GLUCOSE 92 04/04/2016   ALT 605* 04/04/2016   AST 230* 04/04/2016   NA 139 04/04/2016   K 3.2* 04/04/2016   CL 99* 04/04/2016   CREATININE 1.20 04/04/2016   BUN 21* 04/04/2016   CO2 28 04/04/2016   INR 1.35  04/02/2016   HGBA1C 5.0 04/02/2016   Cr stable Not weanable yet Thrombocytopenia  Cr stable   Grace Isaac MD  [facility] [phone] Office [phone] 04/04/2016 6:02 PM

## 2016-04-04 NOTE — Progress Notes (Signed)
PULMONARY / CRITICAL CARE MEDICINE   Name: Jon Rodriguez MRN: [id number] DOB: 06-03-1961    ADMISSION DATE:  03/31/2016 CONSULTATION DATE:  04/02/2016  REFERRING MD: Dr. Prescott Gum  CHIEF COMPLAINT:  Ventilator and critical care management  HISTORY OF PRESENT ILLNESS:   55 year old morbidly obese nondiabetic male with history of treated hypertension and moderate smoking felt sudden tearing chest pain at 11 AM today while working out at a fitness center- gym. This did not resolve and he presented to the emergency department. EKG was nonspecific and trach enzymes were first negative and minimally elevated. CT scan rule out pulmonary embolus was negative. The patient was then re-dosed with contrast for a CTA angiogram and this demonstrates a tear in the ascending aorta extending through the arch and descending thoracic aorta down to the aortic abdominal bifurcation. The innominate artery is not involved by the dissection, the left carotid is not involved, the left subclavian artery appears to be involved with the dissection. The renal and visceral vessels come off the true lumen. The ascending aorta and arch are dilated. The descending thoracic aorta appears to be of normal caliber. There is no clear pericardial effusion. 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 He is currently requiring high oxygen demands as well as high needs for sedation.   SUBJECTIVE:  PEEP 10, Fio2 weaned to 60% RN has reported difficulty keeping pt in bed, seroquel and clonazepam were added 7/11  VITAL SIGNS: BP 95/59 mmHg  Pulse 88  Temp(Src) 98.1 F (36.7 C) (Core (Comment))  Resp 19  Ht 6' (1.829 m)  Wt 135 kg (297 lb 9.9 oz)  BMI 40.36 kg/m2  SpO2 99%  HEMODYNAMICS: PAP: (22-56)/(13-37) 26/19 mmHg CVP:  [8 mmHg-33 mmHg] 12 mmHg CO:  [7.7 L/min-11.1 L/min] 7.7 L/min CI:  [3.1 L/min/m2-4.5 L/min/m2] 3.1 L/min/m2  VENTILATOR SETTINGS: Vent Mode:   [-] PRVC FiO2 (%):  [60 %-70 %] 60 % Set Rate:  [14 bmp-18 bmp] 14 bmp Vt Set:  [620 mL] 620 mL PEEP:  [10 cmH20] 10 cmH20 Plateau Pressure:  [21 cmH20-23 cmH20] 23 cmH20  INTAKE / OUTPUT: I/O last 3 completed shifts: In: 5793.3 [I.V.:5403.3; NG/GT:90; IV Piggyback:300] Out: [id number] [Urine:3865; Emesis/NG output:1200; Chest Tube:910]  PHYSICAL EXAMINATION: General:  Morbidly obese gentleman intubated and sedated.  Neuro:  Sedated on Fentanyl and Versed, eyes opened spontaneously, followed some commands HEENT: Moist mucous membranes. Intubated Cardiovascular: S1S2, no murmur  Lungs: Coarse throughout. Mediastinal chest tubes in place.  Abdomen: Obese, hypoactive bowel sounds.  Musculoskeletal: Sedated, but will wake up and move all extremities Skin: Sternotomy incision, dressing CDI  LABS:  BMET  Recent Labs Lab 04/02/16 0406  04/03/16 0402 04/03/16 1604 04/04/16 0350  NA 141  < > 138 138 136  K 3.7  < > 4.2 3.6 3.3*  CL 110  < > 108 103 104  CO2 27  --  27  --  28  BUN 21*  < > 21* 20 19  CREATININE 1.53*  < > 1.33* 1.10 1.15  GLUCOSE 120*  < > 106* 123* 134*  < > = values in this interval not displayed.  Electrolytes  Recent Labs Lab 04/01/16 0450 04/01/16 0955  04/02/16 0406 04/02/16 1600 04/03/16 0402 04/04/16 0350  CALCIUM 7.8*  --   < > 7.7*  --  7.3* 7.4*  MG 2.0 2.9*  --   --  2.2  --   --   < > =  values in this interval not displayed.  CBC  Recent Labs Lab 04/02/16 1600  04/03/16 0402 04/03/16 1604 04/04/16 0350  WBC 11.0*  --  11.4*  --  7.8  HGB 8.4*  < > 8.0* 7.1* 7.3*  HCT 24.5*  < > 23.4* 21.0* 21.3*  PLT 66*  --  66*  --  62*  < > = values in this interval not displayed.  Coag's  Recent Labs Lab 03/31/16 1702 04/01/16 0450 04/01/16 0955 04/02/16 0406  APTT 33 53*  --  35  INR 1.12 1.13 1.18 1.35    Sepsis Markers No results for input(s): LATICACIDVEN, PROCALCITON, O2SATVEN in the last 168 hours.  ABG  Recent Labs Lab  04/03/16 0359 04/03/16 1604 04/04/16 0358  PHART 7.402 7.456* 7.456*  PCO2ART 43.8 36.3 39.6  PO2ART 65.0* 107.0* 68.0*    Liver Enzymes  Recent Labs Lab 04/02/16 0406 04/03/16 0402 04/04/16 0350  AST 328* 604* 230*  ALT 359* 817* 605*  ALKPHOS 36* 44 45  BILITOT 0.8 0.9 1.1  ALBUMIN 2.5* 2.4* 2.2*    Cardiac Enzymes No results for input(s): TROPONINI, PROBNP in the last 168 hours.  Glucose  Recent Labs Lab 04/03/16 0949 04/03/16 1054 04/03/16 1936 04/03/16 2348 04/04/16 0358 04/04/16 0807  GLUCAP 103* 115* 107* 105* 133* 104*    Imaging Dg Chest Port 1 View  04/04/2016  CLINICAL DATA:  Hypoxia EXAM: PORTABLE CHEST 1 VIEW COMPARISON:  April 03, 2016 FINDINGS: Endotracheal tube tip is 4.1 cm above the carina. Swan-Ganz catheter tip is in the main pulmonary outflow tract. There are chest tubes on each side as well as a mediastinal drain. Nasogastric tube tip and side port are below the diaphragm. No pneumothorax. There is patchy atelectasis in the lung bases. There is cardiomegaly with pulmonary vascularity within normal limits. Prominence in the region of the thoracic aorta is stable. The overall contour the mediastinum is unchanged. IMPRESSION: Tube and catheter positions as described without pneumothorax. Bibasilar atelectasis. Stable cardiac prominence. Stable prominence of the thoracic aorta. No new opacity evident. Electronically Signed   By: Lowella Grip III M.D.   On: 04/04/2016 07:48     STUDIES:    CULTURES: 04/01/16: MRSA nasal swab: Negative 04/02/16: Blood Cultures: pending 04/03/16: Sputum Culture: No organisms on stain   ANTIBIOTICS: Cefuroxime: 04/01/16>>> 04/03/16 Vancomycin: 04/01/16 >>> 04/03/16 Ceftazidime: 04/03/16 >>>   SIGNIFICANT EVENTS:   LINES/TUBES: 03/31/16: ETT 03/31/16: R IJ PA cath 03/31/16: Foley 03/31/16: R IJ Double Lumen 03/31/16: R radial A line 03/31/16: 2 pleural CT  03/31/16: 2 mediastinal CT  DISCUSSION: 7/10: Consulted for  critical care management s/p replacement of ascending aorta with a hemashield platinum vascular graft. 7/11:  Unable to wean vent this AM, will recheck ABG this afternoon and attempt to wean. Fever spiked overnight and pan cultured. Started on ceftazidine overnight. Tolerating vent on current sedation.  7/12: Fio2 to 60%, PEEP remains 10  ASSESSMENT / PLAN:  PULMONARY A: Acute Respiratory Failure with Hypoxemia Tobacco abuse  P:   Continue TV on vent to 8 cc/kg of IBW (72 inches 77.6 Kg IBW) Wean PEEP to 8 and follow SpO2, ABG Decrease RR to 14 Follow CXR Lasix at 10 mg/hr (started AM 7/11) Chlorhexidine for VAP prevention Albuterol nebs PRN    CARDIOVASCULAR A:  Shock, presumed cardiogenic + effects of sedation, CVP 12 on 7/12 S/P repair of Type A aortic dissection P:  BP parameters per CT surgery Currently on milrinone, dopamine, levophed, vasopressin  and amiodarone   RENAL A:   AKI P:   Continue to monitor renal function Follow BMP Lasix at 10 mg/hr (started AM 7/11), now negative over 48h Replace electrolytes as indicated  GASTROINTESTINAL A:   Shock liver P:   Pepcid for GI protection Colace for bowel regimen Zofran as needed for nausea/vomiting Start TF's Follow LFT, resolving   HEMATOLOGIC A:   Anemia Thrombocytopenia P:  Continue to monitor CBC > transfuse per CT surgery No signs of active bleeding Monitor CT output SCD for DVT prophylaxis No chemoprophylaxis at this time   INFECTIOUS A:   Possible PNA, unlikely  Fever P:   Empiric coverage for possible PNA, particularly while he is unstable.  Follow cx data and adjust abx as indicated Continue to monitor fever curve  ENDOCRINE A:   Hyperglycemia P:   Lantus 20 units BID (per CT surgery) Insulin gtt of  NEUROLOGIC A:   Sedation for vent tolerance P:   RASS goal: -2 to -3 Continue Fentanyl and Versed for sedation seroquel and clonazepam added 7/11 Monitor for Neuro changes   Spontaneous awakening trial q AM   FAMILY  - Updates: No family present per nursing staff  - Inter-disciplinary family meet or Palliative Care meeting due by:  04/07/16  Independent critical care time is 32 minutes.   Baltazar Apo, MD, PhD 04/04/2016, 9:00 AM [facility] [phone] or if no answer [phone]

## 2016-04-04 NOTE — Progress Notes (Signed)
4 Days Post-Op Procedure(s) (LRB): REPLACEMENT OF ASCENDING AORTA USING A 47mm HEMASHIELD PLATINUM VASCULAR GRAFT (N/A) Subjective: Status post repair of acute type A aortic dissection Preoperative moderate AI Postoperative RV dysfunction treated with balloon pump now removed Postoperative acute respiratory failure requiring high option requirement and PEEP, slowly improving with diuresis Postoperative delirium requiring heavy sedation protocol Patient has moved all extremities and is starting to follow commands feeding tube placed at bedside to initiate nutrition Postoperative fever treated with pan culture and empiric IV Fortaz maintaining sinus rhythm on amiodarone Patient on low-dose pressors for vasodilatation and sedation    Objective: Vital signs in last 24 hours: Temp:  [96.6 F (35.9 C)-98.8 F (37.1 C)] 96.6 F (35.9 C) (07/12 1620) Pulse Rate:  [87-90] 89 (07/12 1620) Cardiac Rhythm:  [-] Atrial paced (07/12 1200) Resp:  [0-27] 14 (07/12 1620) BP: (81-130)/(47-66) 81/47 mmHg (07/12 1100) SpO2:  [75 %-100 %] 100 % (07/12 1620) FiO2 (%):  [50 %-60 %] 50 % (07/12 1755) Weight:  [297 lb 9.9 oz (135 kg)] 297 lb 9.9 oz (135 kg) (07/12 0417)  Hemodynamic parameters for last 24 hours: PAP: (21-56)/(13-37) 23/15 mmHg CVP:  [7 mmHg-33 mmHg] 7 mmHg CO:  [6.3 L/min-9.8 L/min] 6.3 L/min CI:  [2.6 L/min/m2-4 L/min/m2] 2.6 L/min/m2  Intake/Output from previous day: 07/11 0701 - 07/12 0700 In: 3791.1 [I.V.:3431.1; NG/GT:60; IV Piggyback:300] Out: [id number] [Urine:3260; Emesis/NG output:700; Chest Tube:580] Intake/Output this shift: Total I/O In: 2062.3 [I.V.:1202.3; Blood:335; NG/GT:275; IV Piggyback:250] Out: 1100 [Urine:980; Chest Tube:120] Exam Sedated on ventilator   coarse breath sounds Abdomen nondistended No murmur appreciated Extremities warm with pulses   Lab Results:  Recent Labs  04/03/16 0402  04/04/16 0350 04/04/16 1622 04/04/16 1630  WBC 11.4*  --  7.8   --   --   HGB 8.0*  < > 7.3* 6.5* 7.6*  HCT 23.4*  < > 21.3* 19.0* 22.1*  PLT 66*  --  62*  --   --   < > = values in this interval not displayed. BMET:  Recent Labs  04/03/16 0402  04/04/16 0350 04/04/16 1622  NA 138  < > 136 139  K 4.2  < > 3.3* 3.2*  CL 108  < > 104 99*  CO2 27  --  28  --   GLUCOSE 106*  < > 134* 92  BUN 21*  < > 19 21*  CREATININE 1.33*  < > 1.15 1.20  CALCIUM 7.3*  --  7.4*  --   < > = values in this interval not displayed.  PT/INR:  Recent Labs  04/02/16 0406  LABPROT 16.8*  INR 1.35   ABG    Component Value Date/Time   PHART 7.425 04/04/2016 1629   HCO3 28.9* 04/04/2016 1629   TCO2 30 04/04/2016 1629   ACIDBASEDEF 2.0 04/01/2016 1205   O2SAT 99.0 04/04/2016 1629   CBG (last 3)   Recent Labs  04/04/16 0807 04/04/16 1156 04/04/16 1631  GLUCAP 104* 128* 77    Assessment/Plan: S/P Procedure(s) (LRB): REPLACEMENT OF ASCENDING AORTA USING A 30mm HEMASHIELD PLATINUM VASCULAR GRAFT (N/A) Postoperative expected blood loss anemia-received 2 units packed cells today   oxygenation is improving-will wean FiO2 to 50% Continue to progress with tube feeds Continue to diurese for later extubation   LOS: 4 days    Tharon Aquas Trigt III 04/04/2016

## 2016-04-04 NOTE — Progress Notes (Signed)
   04/04/16 1100  Clinical Encounter Type  Visited With Patient  Visit Type Spiritual support  Referral From Nurse;Other (Comment)  Consult/Referral To Chaplain  Spiritual Encounters  Spiritual Needs Prayer  Chaplain rounding visit made, patient unable to respond, prayer prayed.  Advised staff that Chaplain will be available for further support as needed.

## 2016-04-05 ENCOUNTER — Inpatient Hospital Stay (HOSPITAL_COMMUNITY): Payer: 59

## 2016-04-05 LAB — GLUCOSE, CAPILLARY
Glucose-Capillary: 100 mg/dL — ABNORMAL HIGH (ref 65–99)
Glucose-Capillary: 118 mg/dL — ABNORMAL HIGH (ref 65–99)
Glucose-Capillary: 120 mg/dL — ABNORMAL HIGH (ref 65–99)
Glucose-Capillary: 127 mg/dL — ABNORMAL HIGH (ref 65–99)
Glucose-Capillary: 99 mg/dL (ref 65–99)

## 2016-04-05 LAB — COMPREHENSIVE METABOLIC PANEL
ALT: 468 U/L — ABNORMAL HIGH (ref 17–63)
AST: 117 U/L — ABNORMAL HIGH (ref 15–41)
Albumin: 2.3 g/dL — ABNORMAL LOW (ref 3.5–5.0)
Alkaline Phosphatase: 73 U/L (ref 38–126)
Anion gap: 6 (ref 5–15)
BUN: 19 mg/dL (ref 6–20)
CO2: 28 mmol/L (ref 22–32)
Calcium: 7.7 mg/dL — ABNORMAL LOW (ref 8.9–10.3)
Chloride: 102 mmol/L (ref 101–111)
Creatinine, Ser: 1.23 mg/dL (ref 0.61–1.24)
GFR calc Af Amer: >60 mL/min (ref >60)
GFR calc non Af Amer: >60 mL/min (ref >60)
Glucose, Bld: 108 mg/dL — ABNORMAL HIGH (ref 65–99)
Potassium: 4.2 mmol/L (ref 3.5–5.1)
Sodium: 136 mmol/L (ref 135–145)
Total Bilirubin: 0.8 mg/dL (ref 0.3–1.2)
Total Protein: 4.7 g/dL — ABNORMAL LOW (ref 6.5–8.1)

## 2016-04-05 LAB — POCT I-STAT 4, (NA,K, GLUC, HGB,HCT)
Glucose, Bld: 119 mg/dL — ABNORMAL HIGH (ref 65–99)
HCT: 25 % — ABNORMAL LOW (ref 39.0–52.0)
Hemoglobin: 8.5 g/dL — ABNORMAL LOW (ref 13.0–17.0)
Potassium: 3.4 mmol/L — ABNORMAL LOW (ref 3.5–5.1)
Sodium: 141 mmol/L (ref 135–145)

## 2016-04-05 LAB — TYPE AND SCREEN
ABO/RH(D): B NEG
Antibody Screen: NEGATIVE
Unit division: 0
Unit division: 0

## 2016-04-05 LAB — POCT I-STAT 3, ART BLOOD GAS (G3+)
Acid-Base Excess: 2 mmol/L (ref 0.0–2.0)
Bicarbonate: 28.3 mEq/L — ABNORMAL HIGH (ref 20.0–24.0)
O2 Saturation: 93 %
Patient temperature: 37.8
TCO2: 30 mmol/L (ref 0–100)
pCO2 arterial: 53.8 mmHg — ABNORMAL HIGH (ref 35.0–45.0)
pH, Arterial: 7.332 — ABNORMAL LOW (ref 7.350–7.450)
pO2, Arterial: 78 mmHg — ABNORMAL LOW (ref 80.0–100.0)

## 2016-04-05 LAB — POCT I-STAT, CHEM 8
BUN: 19 mg/dL (ref 6–20)
Calcium, Ion: 1.14 mmol/L (ref 1.13–1.30)
Chloride: 97 mmol/L — ABNORMAL LOW (ref 101–111)
Creatinine, Ser: 1.3 mg/dL — ABNORMAL HIGH (ref 0.61–1.24)
Glucose, Bld: 127 mg/dL — ABNORMAL HIGH (ref 65–99)
HCT: 24 % — ABNORMAL LOW (ref 39.0–52.0)
Hemoglobin: 8.2 g/dL — ABNORMAL LOW (ref 13.0–17.0)
Potassium: 3.4 mmol/L — ABNORMAL LOW (ref 3.5–5.1)
Sodium: 139 mmol/L (ref 135–145)
TCO2: 30 mmol/L (ref 0–100)

## 2016-04-05 LAB — CARBOXYHEMOGLOBIN
Carboxyhemoglobin: 1.3 % (ref 0.5–1.5)
Carboxyhemoglobin: 1.1 % (ref 0.5–1.5)
Methemoglobin: 0.9 % (ref 0.0–1.5)
Methemoglobin: 1.1 % (ref 0.0–1.5)
O2 Saturation: 48.7 %
O2 Saturation: 73.3 %
Total hemoglobin: 9.1 g/dL — ABNORMAL LOW (ref 13.5–18.0)
Total hemoglobin: 8.8 g/dL — ABNORMAL LOW (ref 13.5–18.0)

## 2016-04-05 LAB — CULTURE, RESPIRATORY W GRAM STAIN: Culture: NORMAL

## 2016-04-05 LAB — CBC
HCT: 26.8 % — ABNORMAL LOW (ref 39.0–52.0)
Hemoglobin: 9.1 g/dL — ABNORMAL LOW (ref 13.0–17.0)
MCH: 31.4 pg (ref 26.0–34.0)
MCHC: 34 g/dL (ref 30.0–36.0)
MCV: 92.4 fL (ref 78.0–100.0)
Platelets: 91 10*3/uL — ABNORMAL LOW (ref 150–400)
RBC: 2.9 MIL/uL — ABNORMAL LOW (ref 4.22–5.81)
RDW: 14.8 % (ref 11.5–15.5)
WBC: 8.4 10*3/uL (ref 4.0–10.5)

## 2016-04-05 LAB — LACTATE DEHYDROGENASE: LDH: 291 U/L — ABNORMAL HIGH (ref 98–192)

## 2016-04-05 LAB — MAGNESIUM: Magnesium: 2 mg/dL (ref 1.7–2.4)

## 2016-04-05 MED ORDER — SODIUM CHLORIDE 0.9% FLUSH: 10.0000 mL | INTRAVENOUS | Status: DC | PRN

## 2016-04-05 MED ORDER — POTASSIUM CHLORIDE 20 MEQ PO PACK
40.0000 meq | PACK | Freq: Every day | ORAL | Status: DC
  Administered 2016-04-05: 40 meq via ORAL
  Filled 2016-04-05 (×4): qty 2

## 2016-04-05 MED ORDER — POTASSIUM CHLORIDE 10 MEQ/50ML IV SOLN
10.0000 meq | Freq: Once | INTRAVENOUS | Status: AC
  Administered 2016-04-05: 10 meq via INTRAVENOUS

## 2016-04-05 MED ORDER — JEVITY 1.5 CAL/FIBER PO LIQD
1000.0000 mL | ORAL | Status: AC
  Filled 2016-04-05: qty 1000

## 2016-04-05 MED ORDER — JEVITY 1.5 CAL/FIBER PO LIQD: 1000.0000 mL | ORAL | Status: DC

## 2016-04-05 MED ORDER — FUROSEMIDE 10 MG/ML IJ SOLN
80.0000 mg | Freq: Two times a day (BID) | INTRAMUSCULAR | Status: DC
  Administered 2016-04-06 – 2016-04-07 (×3): 80 mg via INTRAVENOUS
  Filled 2016-04-05 (×3): qty 8

## 2016-04-05 MED ORDER — POTASSIUM CHLORIDE 10 MEQ/50ML IV SOLN
10.0000 meq | INTRAVENOUS | Status: AC | PRN
  Administered 2016-04-05 (×3): 10 meq via INTRAVENOUS
  Filled 2016-04-05 (×3): qty 50

## 2016-04-05 MED ORDER — POTASSIUM CHLORIDE 10 MEQ/50ML IV SOLN
10.0000 meq | INTRAVENOUS | Status: AC
Start: 2016-04-05 — End: 2016-04-06
  Administered 2016-04-05 (×3): 10 meq via INTRAVENOUS
  Filled 2016-04-05 (×3): qty 50

## 2016-04-05 MED ORDER — POTASSIUM CHLORIDE 20 MEQ PO PACK: 40.0000 meq | PACK | Freq: Two times a day (BID) | ORAL | Status: DC

## 2016-04-05 MED ORDER — FUROSEMIDE 10 MG/ML IJ SOLN
80.0000 mg | Freq: Three times a day (TID) | INTRAMUSCULAR | Status: DC
  Administered 2016-04-05: 80 mg via INTRAVENOUS
  Filled 2016-04-05: qty 8

## 2016-04-05 MED ORDER — JEVITY 1.5 CAL/FIBER PO LIQD
1000.0000 mL | ORAL | Status: DC
Start: 2016-04-05 — End: 2016-04-05
  Administered 2016-04-05: 1000 mL
  Filled 2016-04-05 (×2): qty 1000

## 2016-04-05 MED ORDER — JEVITY 1.5 CAL/FIBER PO LIQD
1000.0000 mL | ORAL | Status: DC
  Filled 2016-04-05 (×2): qty 1000

## 2016-04-05 MED ORDER — FUROSEMIDE 10 MG/ML IJ SOLN: 80.0000 mg | Freq: Two times a day (BID) | INTRAMUSCULAR | Status: DC

## 2016-04-05 MED ORDER — SODIUM CHLORIDE 0.9% FLUSH
10.0000 mL | Freq: Two times a day (BID) | INTRAVENOUS | Status: DC
  Administered 2016-04-05: 30 mL
  Administered 2016-04-06 – 2016-04-09 (×7): 10 mL
  Administered 2016-04-09: 30 mL
  Administered 2016-04-10 – 2016-04-14 (×5): 10 mL

## 2016-04-05 NOTE — Progress Notes (Signed)
      [address]       [place] [address]             [phone]      POD # 5 repair ascending dissection  Intubated, sedated  BP 131/61 mmHg  Pulse 89  Temp(Src) 97.9 F (36.6 C) (Axillary)  Resp 0  Ht 6' (1.829 m)  Wt 299 lb 13.2 oz (136 kg)  BMI 40.65 kg/m2  SpO2 100%  Intake/Output Summary (Last 24 hours) at 04/05/16 2239 Last data filed at 04/05/16 2222  Gross per 24 hour  Intake 2600.97 ml  Output   4560 ml  Net -1959.03 ml   Creatinine = 1.3  Continues to slowly improve  Remo Lipps C. Roxan Hockey, MD [facility] [phone]

## 2016-04-05 NOTE — Progress Notes (Signed)
Peripherally Inserted Central Catheter/Midline Placement  The IV Nurse has discussed with the patient and/or persons authorized to consent for the patient, the purpose of this procedure and the potential benefits and risks involved with this procedure.  The benefits include less needle sticks, lab draws from the catheter and patient may be discharged home with the catheter.  Risks include, but not limited to, infection, bleeding, blood clot (thrombus formation), and puncture of an artery; nerve damage and irregular heat beat.  Alternatives to this procedure were also discussed.  PICC/Midline Placement Documentation     Information given to son,Jordan [facility] .   Holley Bouche Renee 04/05/2016, 12:37 PM

## 2016-04-05 NOTE — Progress Notes (Signed)
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 O2 requirements much improved, PEEP being slowly reduced Delirium-agitation now much better Sputum cultures negative, remains on empiric Fortaz for possible pneumonia Tube  feeds being advanced Remains in sinus rhythm on IV amiodarone Low-dose milrinone and epinephrine for postop RV dysfunction Chest tubes being removed gradually  Objective: Vital signs in last 24 hours: Temp:  [96.6 F (35.9 C)-101.1 F (38.4 C)] 97.9 F (36.6 C) (07/13 1603) Pulse Rate:  [73-90] 89 (07/13 1800) Cardiac Rhythm:  [-] Atrial paced (07/13 0800) Resp:  [0-18] 14 (07/13 1800) BP: (94-133)/(46-57) 130/56 mmHg (07/13 1800) SpO2:  [86 %-100 %] 98 % (07/13 1800) FiO2 (%):  [50 %] 50 % (07/13 1603) Weight:  [299 lb 13.2 oz (136 kg)] 299 lb 13.2 oz (136 kg) (07/13 0324)  Hemodynamic parameters for last 24 hours: PAP: (23-48)/(14-40) 36/18 mmHg CVP:  [8 mmHg-29 mmHg] 13 mmHg CO:  [7.8 L/min-12.9 L/min] 9.5 L/min CI:  [3.2 L/min/m2-5.2 L/min/m2] 3.9 L/min/m2  Intake/Output from previous day: 07/12 0701 - 07/13 0700 In: 4181.3 [I.V.:2296.3; Blood:670; NG/GT:565; IV Piggyback:650] Out: 3215 [Urine:2280; Emesis/NG output:425; Chest Tube:510] Intake/Output this shift: Total I/O In: 380.4 [I.V.:200.4; NG/GT:80; IV Piggyback:100] Out: 1575 [Urine:1325; Emesis/NG output:150; Chest Tube:100]  Sedated on ventilator Coarse breath sounds No cardiac murmur Incisions clean and dry Periphery warm with edema Abdomen soft with bowel sounds  Lab Results:  Recent Labs  04/04/16 0350  04/05/16 0425 04/05/16 1731  WBC 7.8  --  8.4  --   HGB 7.3*  < > 9.1* 8.2*  HCT 21.3*  < > 26.8* 24.0*  PLT 62*  --  91*  --   < > = values in this interval not displayed. BMET:   Recent Labs  04/04/16 0350  04/05/16 0425 04/05/16 1731  NA 136  < > 136 139  K 3.3*  < > 4.2 3.4*  CL 104  < > 102 97*  CO2 28  --  28  --   GLUCOSE 134*  < > 108* 127*  BUN 19  < > 19 19  CREATININE 1.15  < > 1.23 1.30*  CALCIUM 7.4*  --  7.7*  --   < > = values in this interval not displayed.  PT/INR: No results for input(s): LABPROT, INR in the last 72 hours. ABG    Component Value Date/Time   PHART 7.332* 04/05/2016 0435   HCO3 28.3* 04/05/2016 0435   TCO2 30 04/05/2016 1731   ACIDBASEDEF 2.0 04/01/2016 1205   O2SAT 73.3 04/05/2016 1245   CBG (last 3)   Recent Labs  04/05/16 0403 04/05/16 0831 04/05/16 1610  GLUCAP 120* 99 127*    Assessment/Plan: S/P Procedure(s) (LRB): REPLACEMENT OF ASCENDING AORTA USING A 12mm HEMASHIELD PLATINUM VASCULAR GRAFT (N/A) Postoperative acute blood loss anemia-expected no transfusions today Platelet count improved to 91,000 Postoperative acute renal failure, now improving Continue with vent weaning per CCM   LOS: 5 days    Tharon Aquas Trigt III 04/05/2016

## 2016-04-05 NOTE — Progress Notes (Signed)
PULMONARY / CRITICAL CARE MEDICINE   Name: Jon Rodriguez MRN: [id number] DOB: 18-Feb-1961    ADMISSION DATE:  03/31/2016 CONSULTATION DATE:  04/02/2016  REFERRING MD: Dr. Prescott Gum  CHIEF COMPLAINT:  Ventilator and critical care management  HISTORY OF PRESENT ILLNESS:   55 year old morbidly obese nondiabetic male with history of treated hypertension and moderate smoking felt sudden tearing chest pain at 11 AM today while working out at a fitness center- gym. This did not resolve and he presented to the emergency department. EKG was nonspecific and trach enzymes were first negative and minimally elevated. CT scan rule out pulmonary embolus was negative. The patient was then re-dosed with contrast for a CTA angiogram and this demonstrates a tear in the ascending aorta extending through the arch and descending thoracic aorta down to the aortic abdominal bifurcation. The innominate artery is not involved by the dissection, the left carotid is not involved, the left subclavian artery appears to be involved with the dissection. The renal and visceral vessels come off the true lumen. The ascending aorta and arch are dilated. The descending thoracic aorta appears to be of normal caliber. There is no clear pericardial effusion. He is now in [place] s/p repair of type A aortic dissection with a hemashield platinum vascular graft. He is currently requiring high oxygen demands as well as high needs for sedation.   SUBJECTIVE:  PEEP to 8, fiO2 to 0.4 Milrinone decreased 7/12 > co-ox to 48% Epi gtt added this am Versed has been weaned to off, fentanyl at 150/h Received 2u PRBC 7/12 Posterior mediastinal and R pleural tubes removed I/O positive, lasix gtt stopped 7/13 am  VITAL SIGNS: BP 81/47 mmHg  Pulse 89  Temp(Src) 99.7 F (37.6 C) (Core (Comment))  Resp 14  Ht 6' (1.829 m)  Wt 136 kg (299 lb 13.2 oz)  BMI 40.65 kg/m2  SpO2 100%  HEMODYNAMICS: PAP: (21-48)/(14-40) 32/19 mmHg CVP:  [7 mmHg-29  mmHg] 12 mmHg CO:  [6.3 L/min-12.9 L/min] 8.4 L/min CI:  [2.6 L/min/m2-5.2 L/min/m2] 3.4 L/min/m2  VENTILATOR SETTINGS: Vent Mode:  [-] PRVC FiO2 (%):  [50 %-60 %] 50 % Set Rate:  [14 bmp] 14 bmp Vt Set:  [620 mL] 620 mL PEEP:  [8 cmH20] 8 cmH20 Plateau Pressure:  [20 cmH20-24 cmH20] 22 cmH20  INTAKE / OUTPUT: I/O last 3 completed shifts: In: 5708.5 [I.V.:3773.5; Blood:670; NG/GT:565; IV [id number] Out: [id number] [Urine:3580; Emesis/NG output:825; Chest Tube:790]  PHYSICAL EXAMINATION: General:  Morbidly obese gentleman intubated and sedated.  Neuro:  Sedated on Fentanyl, eyes opened spontaneously, followed some commands HEENT: Moist mucous membranes. Intubated Cardiovascular: S1S2, no murmur  Lungs: Coarse throughout. Mediastinal chest tubes in place.  Abdomen: Obese, hypoactive bowel sounds.  Musculoskeletal: Sedated, but will wake up and move all extremities Skin: Sternotomy incision, dressing CDI  LABS:  BMET  Recent Labs Lab 04/03/16 0402  04/04/16 0350 04/04/16 1622 04/05/16 0030 04/05/16 0425  NA 138  < > 136 139 141 136  K 4.2  < > 3.3* 3.2* 3.4* 4.2  CL 108  < > 104 99*  --  102  CO2 27  --  28  --   --  28  BUN 21*  < > 19 21*  --  19  CREATININE 1.33*  < > 1.15 1.20  --  1.23  GLUCOSE 106*  < > 134* 92 119* 108*  < > = values in this interval not displayed.  Electrolytes  Recent Labs Lab 04/01/16 0955  04/02/16  1600 04/03/16 0402 04/04/16 0350 04/05/16 0425  CALCIUM  --   < >  --  7.3* 7.4* 7.7*  MG 2.9*  --  2.2  --   --  2.0  < > = values in this interval not displayed.  CBC  Recent Labs Lab 04/03/16 0402  04/04/16 0350  04/04/16 1630 04/05/16 0030 04/05/16 0425  WBC 11.4*  --  7.8  --   --   --  8.4  HGB 8.0*  < > 7.3*  < > 7.6* 8.5* 9.1*  HCT 23.4*  < > 21.3*  < > 22.1* 25.0* 26.8*  PLT 66*  --  62*  --   --   --  91*  < > = values in this interval not displayed.  Coag's  Recent Labs Lab 03/31/16 1702 04/01/16 0450  04/01/16 0955 04/02/16 0406  APTT 33 53*  --  35  INR 1.12 1.13 1.18 1.35    Sepsis Markers No results for input(s): LATICACIDVEN, PROCALCITON, O2SATVEN in the last 168 hours.  ABG  Recent Labs Lab 04/04/16 0358 04/04/16 1629 04/05/16 0435  PHART 7.456* 7.425 7.332*  PCO2ART 39.6 43.5 53.8*  PO2ART 68.0* 151.0* 78.0*    Liver Enzymes  Recent Labs Lab 04/03/16 0402 04/04/16 0350 04/05/16 0425  AST 604* 230* 117*  ALT 817* 605* 468*  ALKPHOS 44 45 73  BILITOT 0.9 1.1 0.8  ALBUMIN 2.4* 2.2* 2.3*    Cardiac Enzymes No results for input(s): TROPONINI, PROBNP in the last 168 hours.  Glucose  Recent Labs Lab 04/04/16 1156 04/04/16 1631 04/04/16 1942 04/05/16 0011 04/05/16 0403 04/05/16 0831  GLUCAP 128* 77 97 100* 120* 99    Imaging Dg Chest Port 1 View  04/05/2016  CLINICAL DATA:  Shortness of breath, evaluate endotracheal tube EXAM: PORTABLE CHEST 1 VIEW COMPARISON:  04/04/2016 FINDINGS: Postsurgical changes are again noted. An endotracheal tube, nasogastric catheter, feeding catheter, Swan-Ganz catheter and bilateral thoracostomy catheters are again seen and stable. Mediastinal drain is noted as well. The overall inspiratory effort is poor with crowding of the vascular markings and bibasilar atelectasis. No pneumothorax is seen. Right jugular central line is again seen in the proximal superior vena cava. IMPRESSION: Tubes and lines as described above. Bibasilar atelectatic changes are noted.  No pneumothorax is seen. Electronically Signed   By: Inez Catalina M.D.   On: 04/05/2016 07:44   Dg Abd Portable 1v  04/04/2016  CLINICAL DATA:  Feeding tube placement EXAM: PORTABLE ABDOMEN - 1 VIEW COMPARISON:  None. FINDINGS: Normal small bowel gas pattern. These NG feeding tube in place with tip in distal duodenum. IMPRESSION: NG feeding tube in place with tip in distal duodenum. Electronically Signed   By: Lahoma Crocker M.D.   On: 04/04/2016 11:15     STUDIES:     CULTURES: 04/01/16: MRSA nasal swab: Negative 04/02/16: Blood Cultures: pending 04/03/16: Sputum Culture: No organisms on stain   ANTIBIOTICS: Cefuroxime: 04/01/16>>> 04/03/16 Vancomycin: 04/01/16 >>> 04/03/16 Ceftazidime: 04/03/16 >>>   SIGNIFICANT EVENTS:   LINES/TUBES: 03/31/16: ETT 03/31/16: R IJ PA cath 03/31/16: Foley 03/31/16: R IJ Double Lumen 03/31/16: R radial A line 03/31/16: R chest tube >> 7/12 03/31/16: L chest tube  03/31/16: 2 mediastinal CT > posterior 7/12 and anterior >>   DISCUSSION: 7/10: Consulted for critical care management s/p replacement of ascending aorta with a hemashield platinum vascular graft. 7/11:  Unable to wean vent this AM, will recheck ABG this afternoon and attempt to wean. Fever  spiked overnight and pan cultured. Started on ceftazidine overnight. Tolerating vent on current sedation.  7/12: Fio2 to 60%, PEEP remains 10  ASSESSMENT / PLAN:  PULMONARY A: Acute Respiratory Failure with Hypoxemia Tobacco abuse  P:   Continue TV on vent to 8 cc/kg of IBW (72 inches 77.6 Kg IBW) Wean PEEP to 8 and follow SpO2, ABG Decrease RR to 14 Follow CXR Lasix at 10 mg/hr (started AM 7/11) Chlorhexidine for VAP prevention Albuterol nebs PRN    CARDIOVASCULAR A:  Shock, presumed cardiogenic + effects of sedation, CVP 12 on 7/12 S/P repair of Type A aortic dissection P:  BP parameters per CT surgery Currently on milrinone, dopamine, levophed, vasopressin and amiodarone   RENAL A:   AKI P:   Continue to monitor renal function Follow BMP Lasix gtt started, agree w starting lasix pushes, goal net negative balance Replace electrolytes as indicated  GASTROINTESTINAL A:   Shock liver P:   Pepcid for GI protection Colace for bowel regimen Zofran as needed for nausea/vomiting Continue TF Follow LFT, resolving   HEMATOLOGIC A:   Anemia Thrombocytopenia, slowly improving P:  Continue to monitor CBC > transfuse per CT surgery, s/p 2u PRBC on 7/12 No  signs of active bleeding Monitor CT output SCD for DVT prophylaxis No chemoprophylaxis at this time   INFECTIOUS A:   Possible PNA, unlikely  Fever P:   Empiric coverage for possible PNA, particularly while he is unstable.  Follow cx data and adjust abx as indicated Continue to monitor fever curve  ENDOCRINE A:   Hyperglycemia P:   Lantus 20 units BID + SSI  Insulin gtt off  NEUROLOGIC A:   Sedation for vent tolerance P:   RASS goal: lighten to -2 Continue Fentanyl gtt seroquel and clonazepam added 7/11 Monitor for Neuro changes  Spontaneous awakening trial q AM   FAMILY  - Updates: No family present   - Inter-disciplinary family meet or Palliative Care meeting due by:  04/07/16  Independent critical care time is 34 minutes.   Baltazar Apo, MD, PhD 04/05/2016, 9:45 AM [facility] [phone] or if no answer [phone]

## 2016-04-06 ENCOUNTER — Inpatient Hospital Stay (HOSPITAL_COMMUNITY): Payer: 59

## 2016-04-06 LAB — POCT I-STAT 3, ART BLOOD GAS (G3+)
Acid-Base Excess: 10 mmol/L — ABNORMAL HIGH (ref 0.0–2.0)
Acid-Base Excess: 12 mmol/L — ABNORMAL HIGH (ref 0.0–2.0)
Acid-Base Excess: 16 mmol/L — ABNORMAL HIGH (ref 0.0–2.0)
Bicarbonate: 35.2 mEq/L — ABNORMAL HIGH (ref 20.0–24.0)
Bicarbonate: 38.6 mEq/L — ABNORMAL HIGH (ref 20.0–24.0)
Bicarbonate: 40.7 mEq/L — ABNORMAL HIGH (ref 20.0–24.0)
O2 Saturation: 94 %
O2 Saturation: 95 %
O2 Saturation: 90 %
Patient temperature: 98.7
Patient temperature: 98.2
Patient temperature: 98.2
TCO2: 37 mmol/L (ref 0–100)
TCO2: 40 mmol/L (ref 0–100)
TCO2: 42 mmol/L (ref 0–100)
pCO2 arterial: 53.3 mmHg — ABNORMAL HIGH (ref 35.0–45.0)
pCO2 arterial: 62 mmHg (ref 35.0–45.0)
pCO2 arterial: 53.1 mmHg — ABNORMAL HIGH (ref 35.0–45.0)
pH, Arterial: 7.428 (ref 7.350–7.450)
pH, Arterial: 7.401 (ref 7.350–7.450)
pH, Arterial: 7.492 — ABNORMAL HIGH (ref 7.350–7.450)
pO2, Arterial: 71 mmHg — ABNORMAL LOW (ref 80.0–100.0)
pO2, Arterial: 75 mmHg — ABNORMAL LOW (ref 80.0–100.0)
pO2, Arterial: 55 mmHg — ABNORMAL LOW (ref 80.0–100.0)

## 2016-04-06 LAB — CARBOXYHEMOGLOBIN
Carboxyhemoglobin: 1.7 % — ABNORMAL HIGH (ref 0.5–1.5)
Methemoglobin: 0.9 % (ref 0.0–1.5)
O2 Saturation: 71.7 %
Total hemoglobin: 8.7 g/dL — ABNORMAL LOW (ref 13.5–18.0)

## 2016-04-06 LAB — COMPREHENSIVE METABOLIC PANEL
ALT: 314 U/L — ABNORMAL HIGH (ref 17–63)
AST: 50 U/L — ABNORMAL HIGH (ref 15–41)
Albumin: 2 g/dL — ABNORMAL LOW (ref 3.5–5.0)
Alkaline Phosphatase: 17 U/L — ABNORMAL LOW (ref 38–126)
Anion gap: 9 (ref 5–15)
BUN: 14 mg/dL (ref 6–20)
CO2: 32 mmol/L (ref 22–32)
Calcium: 8.1 mg/dL — ABNORMAL LOW (ref 8.9–10.3)
Chloride: 96 mmol/L — ABNORMAL LOW (ref 101–111)
Creatinine, Ser: 1.17 mg/dL (ref 0.61–1.24)
GFR calc Af Amer: >60 mL/min (ref >60)
GFR calc non Af Amer: >60 mL/min (ref >60)
Glucose, Bld: 119 mg/dL — ABNORMAL HIGH (ref 65–99)
Potassium: 3.3 mmol/L — ABNORMAL LOW (ref 3.5–5.1)
Sodium: 137 mmol/L (ref 135–145)
Total Bilirubin: 0.6 mg/dL (ref 0.3–1.2)
Total Protein: 4.5 g/dL — ABNORMAL LOW (ref 6.5–8.1)

## 2016-04-06 LAB — CBC
HCT: 24.9 % — ABNORMAL LOW (ref 39.0–52.0)
Hemoglobin: 8.3 g/dL — ABNORMAL LOW (ref 13.0–17.0)
MCH: 31 pg (ref 26.0–34.0)
MCHC: 33.3 g/dL (ref 30.0–36.0)
MCV: 92.9 fL (ref 78.0–100.0)
Platelets: 134 10*3/uL — ABNORMAL LOW (ref 150–400)
RBC: 2.68 MIL/uL — ABNORMAL LOW (ref 4.22–5.81)
RDW: 14.9 % (ref 11.5–15.5)
WBC: 6.9 10*3/uL (ref 4.0–10.5)

## 2016-04-06 LAB — GLUCOSE, CAPILLARY
Glucose-Capillary: 123 mg/dL — ABNORMAL HIGH (ref 65–99)
Glucose-Capillary: 112 mg/dL — ABNORMAL HIGH (ref 65–99)
Glucose-Capillary: 110 mg/dL — ABNORMAL HIGH (ref 65–99)
Glucose-Capillary: 118 mg/dL — ABNORMAL HIGH (ref 65–99)
Glucose-Capillary: 106 mg/dL — ABNORMAL HIGH (ref 65–99)

## 2016-04-06 LAB — POCT I-STAT, CHEM 8
BUN: 14 mg/dL (ref 6–20)
Calcium, Ion: 1.13 mmol/L (ref 1.13–1.30)
Chloride: 88 mmol/L — ABNORMAL LOW (ref 101–111)
Creatinine, Ser: 1.1 mg/dL (ref 0.61–1.24)
Glucose, Bld: 175 mg/dL — ABNORMAL HIGH (ref 65–99)
HCT: 24 % — ABNORMAL LOW (ref 39.0–52.0)
Hemoglobin: 8.2 g/dL — ABNORMAL LOW (ref 13.0–17.0)
Potassium: 3.3 mmol/L — ABNORMAL LOW (ref 3.5–5.1)
Sodium: 137 mmol/L (ref 135–145)
TCO2: 35 mmol/L (ref 0–100)

## 2016-04-06 LAB — LACTATE DEHYDROGENASE: LDH: 247 U/L — ABNORMAL HIGH (ref 98–192)

## 2016-04-06 LAB — MAGNESIUM: Magnesium: 2.3 mg/dL (ref 1.7–2.4)

## 2016-04-06 MED ORDER — POTASSIUM CHLORIDE 10 MEQ/50ML IV SOLN
10.0000 meq | INTRAVENOUS | Status: AC | PRN
  Administered 2016-04-06 (×3): 10 meq via INTRAVENOUS
  Filled 2016-04-06: qty 50

## 2016-04-06 MED ORDER — DEXMEDETOMIDINE HCL IN NACL 200 MCG/50ML IV SOLN
0.0000 ug/kg/h | INTRAVENOUS | Status: DC
  Administered 2016-04-06: 0.5 ug/kg/h via INTRAVENOUS
  Filled 2016-04-06: qty 50

## 2016-04-06 MED ORDER — CLONAZEPAM 0.5 MG PO TABS: 0.5000 mg | ORAL_TABLET | Freq: Two times a day (BID) | ORAL | Status: DC

## 2016-04-06 MED ORDER — JEVITY 1.5 CAL/FIBER PO LIQD: 1000.0000 mL | ORAL | Status: DC

## 2016-04-06 MED ORDER — POTASSIUM CHLORIDE 10 MEQ/50ML IV SOLN
INTRAVENOUS | Status: AC
  Filled 2016-04-06: qty 50

## 2016-04-06 MED ORDER — JEVITY 1.5 CAL/FIBER PO LIQD
1000.0000 mL | ORAL | Status: DC
  Filled 2016-04-06 (×7): qty 1000

## 2016-04-06 MED ORDER — JEVITY 1.2 CAL PO LIQD: 1000.0000 mL | ORAL | Status: DC

## 2016-04-06 MED ORDER — POTASSIUM CHLORIDE 10 MEQ/50ML IV SOLN
10.0000 meq | INTRAVENOUS | Status: AC
  Administered 2016-04-06 (×2): 10 meq via INTRAVENOUS
  Filled 2016-04-06 (×3): qty 50

## 2016-04-06 MED ORDER — CHLORHEXIDINE GLUCONATE 0.12 % MT SOLN
OROMUCOSAL | Status: AC
  Administered 2016-04-06: 15 mL
  Filled 2016-04-06: qty 15

## 2016-04-06 MED ORDER — POTASSIUM CHLORIDE 10 MEQ/50ML IV SOLN
10.0000 meq | INTRAVENOUS | Status: AC
  Administered 2016-04-06 – 2016-04-07 (×6): 10 meq via INTRAVENOUS
  Filled 2016-04-06 (×4): qty 50

## 2016-04-06 NOTE — Procedures (Signed)
Extubation Procedure Note  Patient Details:   Name: Jon Rodriguez DOB: 1961/09/22 MRN: [id number]   Airway Documentation:     Evaluation  O2 sats: stable throughout Complications: No apparent complications Patient did tolerate procedure well. Bilateral Breath Sounds: Clear, Diminished   Yes   NIF - 40, VC 1.02, positive cuff leak prior to extubation.  Post extubation: pt placed on [place] 6 Lpm with humidity, no stridor noted, pt able to get 1000 with incentive spirometer.  [name] 04/06/2016, 11:09 AM

## 2016-04-06 NOTE — Progress Notes (Signed)
PULMONARY / CRITICAL CARE MEDICINE   Name: Jon Rodriguez MRN: [id number] DOB: Jan 29, 1961    ADMISSION DATE:  03/31/2016 CONSULTATION DATE:  04/02/2016  REFERRING MD: Dr. Prescott Gum  CHIEF COMPLAINT:  Ventilator and critical care management  HISTORY OF PRESENT ILLNESS:   55 year old morbidly obese nondiabetic male with history of treated hypertension and moderate smoking felt sudden tearing chest pain at 11 AM today while working out at a fitness center- gym. This did not resolve and he presented to the emergency department. EKG was nonspecific and trach enzymes were first negative and minimally elevated. CT scan rule out pulmonary embolus was negative. The patient was then re-dosed with contrast for a CTA angiogram and this demonstrates a tear in the ascending aorta extending through the arch and descending thoracic aorta down to the aortic abdominal bifurcation. The innominate artery is not involved by the dissection, the left carotid is not involved, the left subclavian artery appears to be involved with the dissection. The renal and visceral vessels come off the true lumen. The ascending aorta and arch are dilated. The descending thoracic aorta appears to be of normal caliber. There is no clear pericardial effusion. 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 He is currently requiring high oxygen demands as well as high needs for sedation.   SUBJECTIVE:  Looks remarkably better Currently on PSV 8 / PEEP 8 Versed is off, precedex added this am - he is awake and following commands  VITAL SIGNS: BP 133/44 mmHg  Pulse 90  Temp(Src) 98.2 F (36.8 C) (Axillary)  Resp 28  Ht 6' (1.829 m)  Wt 132.9 kg (292 lb 15.9 oz)  BMI 39.73 kg/m2  SpO2 100%  HEMODYNAMICS: PAP: (36-46)/(18-24) 36/18 mmHg CVP:  [13 mmHg-19 mmHg] 13 mmHg CO:  [9.5 L/min] 9.5 L/min CI:  [3.9 L/min/m2] 3.9 L/min/m2  VENTILATOR SETTINGS: Vent Mode:  [-]  CPAP;PSV FiO2 (%):  [50 %] 50 % Set Rate:  [14 bmp] 14 bmp Vt Set:  [620 mL] 620 mL PEEP:  [8 cmH20] 8 cmH20 Pressure Support:  [8 cmH20] 8 cmH20 Plateau Pressure:  [20 cmH20-24 cmH20] 22 cmH20  INTAKE / OUTPUT: I/O last 3 completed shifts: In: 4449.5 [I.V.:2384.5; Blood:335; NG/GT:1080; IV [id number] Out: [id number]; Emesis/NG output:1125; Chest Tube:710]  PHYSICAL EXAMINATION: General:  Morbidly obese gentleman intubated, in chair position Neuro:  eyes open, followed commands, awake HEENT: Moist mucous membranes. Intubated Cardiovascular: S1S2, no murmur  Lungs: Coarse throughout. chest tube in place.  Abdomen: Obese, hypoactive bowel sounds.  Musculoskeletal: Sedated, but will wake up and move all extremities Skin: Sternotomy incision, dressing CDI  LABS:  BMET  Recent Labs Lab 04/04/16 0350  04/05/16 0425 04/05/16 1731 04/06/16 0415  NA 136  < > 136 139 137  K 3.3*  < > 4.2 3.4* 3.3*  CL 104  < > 102 97* 96*  CO2 28  --  28  --  32  BUN 19  < > 19 19 14   CREATININE 1.15  < > 1.23 1.30* 1.17  GLUCOSE 134*  < > 108* 127* 119*  < > = values in this interval not displayed.  Electrolytes  Recent Labs Lab 04/02/16 1600  04/04/16 0350 04/05/16 0425 04/06/16 0415  CALCIUM  --   < > 7.4* 7.7* 8.1*  MG 2.2  --   --  2.0 2.3  < > = values in this interval not displayed.  CBC  Recent Labs Lab 04/04/16 0350  04/05/16 0425 04/05/16 1731 04/06/16 0415  WBC 7.8  --  8.4  --  6.9  HGB 7.3*  < > 9.1* 8.2* 8.3*  HCT 21.3*  < > 26.8* 24.0* 24.9*  PLT 62*  --  91*  --  134*  < > = values in this interval not displayed.  Coag's  Recent Labs Lab 03/31/16 1702 04/01/16 0450 04/01/16 0955 04/02/16 0406  APTT 33 53*  --  35  INR 1.12 1.13 1.18 1.35    Sepsis Markers No results for input(s): LATICACIDVEN, PROCALCITON, O2SATVEN in the last 168 hours.  ABG  Recent Labs Lab 04/04/16 1629 04/05/16 0435 04/06/16 0415  PHART 7.425 7.332* 7.428   PCO2ART 43.5 53.8* 53.3*  PO2ART 151.0* 78.0* 71.0*    Liver Enzymes  Recent Labs Lab 04/04/16 0350 04/05/16 0425 04/06/16 0415  AST 230* 117* 50*  ALT 605* 468* 314*  ALKPHOS 45 73 17*  BILITOT 1.1 0.8 0.6  ALBUMIN 2.2* 2.3* 2.0*    Cardiac Enzymes No results for input(s): TROPONINI, PROBNP in the last 168 hours.  Glucose  Recent Labs Lab 04/05/16 0403 04/05/16 0831 04/05/16 1610 04/05/16 1939 04/06/16 0140 04/06/16 0759  GLUCAP 120* 99 127* 118* 123* 112*    Imaging Dg Chest Port 1 View  04/06/2016  CLINICAL DATA:  ET tube, chest tube EXAM: PORTABLE CHEST 1 VIEW COMPARISON:  04/05/2016 FINDINGS: Changes of CABG. Left basilar chest tube again noted. No pneumothorax. Endotracheal tube and NG tube are unchanged. Swan-Ganz catheter has been removed. Continued left lower lobe atelectasis. Mild vascular congestion. Stable cardiomegaly. Some improved aeration in the right base. IMPRESSION: Interval removal of Swan-Ganz catheter. Support devices otherwise remains stable. No pneumothorax. Continued left base atelectasis and vascular congestion. Improving aeration at the right base. Electronically Signed   By: Rolm Baptise M.D.   On: 04/06/2016 08:13     STUDIES:    CULTURES: 04/01/16: MRSA nasal swab: Negative 04/02/16: Blood Cultures: pending 04/03/16: Sputum Culture: negative Urine 7/11 >> negative   ANTIBIOTICS: Cefuroxime: 04/01/16>>> 04/03/16 Vancomycin: 04/01/16 >>> 04/03/16 Ceftazidime: 04/03/16 >>>   SIGNIFICANT EVENTS:   LINES/TUBES: 03/31/16: ETT 03/31/16: R IJ PA cath >> 7/13 03/31/16: Foley 03/31/16: R IJ Double Lumen >> 7/13 03/31/16: R radial A line 03/31/16: R chest tube >> 7/12 03/31/16: L chest tube  >> 7/14 03/31/16: 2 mediastinal CT > posterior 7/12 and anterior >>  04/05/16: PICC >>   DISCUSSION: 7/10: Consulted for critical care management s/p replacement of ascending aorta with a hemashield platinum vascular graft. 7/11:  Unable to wean vent this AM, will  recheck ABG this afternoon and attempt to wean. Fever spiked overnight and pan cultured. Started on ceftazidine overnight. Tolerating vent on current sedation.  7/12: Fio2 to 60%, PEEP remains 10 7/14: tolerating PSV, significant improvement in MS on current sedation regimen  ASSESSMENT / PLAN:  PULMONARY A: Acute Respiratory Failure with Hypoxemia Tobacco abuse  P:   Wean PEEP to 5, continue PSV with a goal for extubation today Follow CXR Now on scheduled lasix 80 q12h Chlorhexidine for VAP prevention Albuterol nebs PRN    CARDIOVASCULAR A:  Shock, presumed cardiogenic + effects of sedation, CVP 12 on 7/12 S/P repair of Type A aortic dissection P:  BP parameters per CT surgery Currently on milrinone, epi gtt, and amiodarone   RENAL A:   AKI P:   Continue to monitor renal function Follow BMP Lasix pushes scheduled Replace electrolytes as indicated  GASTROINTESTINAL A:   Shock liver P:   Pepcid for GI protection Colace for bowel regimen Zofran as needed for nausea/vomiting Continue TF Follow LFT, resolving   HEMATOLOGIC A:   Anemia Thrombocytopenia, slowly improving P:  Continue to monitor CBC > transfuse per CT surgery, s/p 2u PRBC on 7/12 No signs of active bleeding Monitor CT output SCD for DVT prophylaxis No chemoprophylaxis at this time   INFECTIOUS A:   Possible PNA, unlikely  Fever P:   Empiric coverage for possible PNA, particularly while he is unstable.  Follow cx data and adjust abx as indicated Continue to monitor fever curve  ENDOCRINE A:   Hyperglycemia P:   Lantus 20 units BID + SSI  Insulin gtt off  NEUROLOGIC A:   Sedation for vent tolerance P:   RASS goal: lighten to 0 Fentanyl gtt off, precedex running seroquel and clonazepam added 7/11, decrease clonazepam to 0.5 on 7/14 Monitor for Neuro changes    FAMILY  - Updates: No family present   - Inter-disciplinary family meet or Palliative Care meeting due by:   04/07/16  Independent critical care time is 35 minutes.   Baltazar Apo, MD, PhD 04/06/2016, 9:47 AM [facility] [phone] or if no answer [phone]

## 2016-04-06 NOTE — Progress Notes (Signed)
6 Days Post-Op Procedure(s) (LRB): REPLACEMENT OF ASCENDING AORTA USING A 4mm HEMASHIELD PLATINUM VASCULAR GRAFT (N/A) Subjective: Status post repair of acute ascending type a dissection and resuspension of valve Postoperative acute renal insufficiency Postoperative acute respiratory insufficiency, remains on vent  Intermittently awake and responsive, moves all extremities Hemodynamics stable on low-dose inotropes for blood pressure Postoperative expected blood loss anemia, stable hemoglobin Tube feeds now it 50 cc/h via core track Patient beginning pressure support weaning and followed by CCM  Objective: Vital signs in last 24 hours: Temp:  [97.9 F (36.6 C)-100 F (37.8 C)] 98.2 F (36.8 C) (07/14 0803) Pulse Rate:  [87-90] 89 (07/14 0945) Cardiac Rhythm:  [-] Atrial paced (07/13 2000) Resp:  [0-28] 15 (07/14 0945) BP: (94-163)/(44-92) 163/92 mmHg (07/14 0945) SpO2:  [93 %-100 %] 98 % (07/14 0945) FiO2 (%):  [50 %] 50 % (07/14 0846) Weight:  [292 lb 15.9 oz (132.9 kg)] 292 lb 15.9 oz (132.9 kg) (07/14 0500)  Hemodynamic parameters for last 24 hours: PAP: (36-46)/(18-24) 36/18 mmHg CVP:  [13 mmHg-19 mmHg] 13 mmHg CO:  [9.5 L/min] 9.5 L/min CI:  [3.9 L/min/m2] 3.9 L/min/m2  Intake/Output from previous day: 07/13 0701 - 07/14 0700 In: 2648.7 [I.V.:1518.7; NG/GT:830; IV Piggyback:300] Out: [id number]; Emesis/NG output:700; Chest Tube:320] Intake/Output this shift:    Responsive but sleeping on ventilator  Lungs Clearing no murmur A pacing at 90 Abdomen soft Extremities warm but edematous  Lab Results:  Recent Labs  04/05/16 0425 04/05/16 1731 04/06/16 0415  WBC 8.4  --  6.9  HGB 9.1* 8.2* 8.3*  HCT 26.8* 24.0* 24.9*  PLT 91*  --  134*   BMET:  Recent Labs  04/05/16 0425 04/05/16 1731 04/06/16 0415  NA 136 139 137  K 4.2 3.4* 3.3*  CL 102 97* 96*  CO2 28  --  32  GLUCOSE 108* 127* 119*  BUN 19 19 14   CREATININE 1.23 1.30* 1.17  CALCIUM 7.7*   --  8.1*    PT/INR: No results for input(s): LABPROT, INR in the last 72 hours. ABG    Component Value Date/Time   PHART 7.428 04/06/2016 0415   HCO3 35.2* 04/06/2016 0415   TCO2 37 04/06/2016 0415   ACIDBASEDEF 2.0 04/01/2016 1205   O2SAT 71.7 04/06/2016 0420   CBG (last 3)   Recent Labs  04/05/16 1939 04/06/16 0140 04/06/16 0759  GLUCAP 118* 123* 112*    Assessment/Plan: S/P Procedure(s) (LRB): REPLACEMENT OF ASCENDING AORTA USING A 52mm HEMASHIELD PLATINUM VASCULAR GRAFT (N/A) Continue with current care Ventilator wean per CCM Follow daily central venous oxygen saturation-patient on milrinone Continue IV Lasix 80 twice a day   LOS: 6 days    Tharon Aquas Trigt III 04/06/2016

## 2016-04-06 NOTE — Progress Notes (Signed)
Nutrition Consult/Follow Up  DOCUMENTATION CODES:   Obesity unspecified  INTERVENTION:   Once/if CORTRAK feeding tube replaced, resume Jevity 1.5 formula at goal rate of 85 ml/h (2040 ml per day) to provide 2448 kcals, 130 gm protein, 1550 ml free water daily  NUTRITION DIAGNOSIS:   Inadequate oral intake related to inability to eat as evidenced by NPO status, ongoing  GOAL:   Patient will meet greater than or equal to 90% of their needs, currently unmet  MONITOR:   TF tolerance, Vent status, Labs, Weight trends, I & O's  ASSESSMENT:   55 yo Male with morbid obesity, hypertension, and an active smoking history who suffered an acute type A ascending aortic dissection.  Patient s/p procedure 7/9: REPLACEMENT OF ASCENDING AORTA  ANTEGRADE CEREBRAL PERFUSION HYPOTHERMIC CIRCULATORY ARREST  Patient extubated this AM.  CORTRAK small bore feeding tube came out with extubation. Jevity 1.5 formula was previously infusing at 50 ml/hr providing 1800 kcals, 76 gm protein, 912 ml of free water. Spoke with RN, Elmyra Ricks >> she is going to try and replace his feeding tube. RD consulted for TF management.  Diet Order:  Diet NPO time specified  Skin:  Reviewed, no issues  Last BM:  N/A  Height:   Ht Readings from Last 1 Encounters:  04/02/16 6' (1.829 m)    Weight:   Wt Readings from Last 1 Encounters:  04/06/16 292 lb 15.9 oz (132.9 kg)    Ideal Body Weight:  81 kg  BMI:  Body mass index is 39.73 kg/(m^2).  Estimated Nutritional Needs:   Kcal:  2400-2600  Protein:  130-140 gm  Fluid:  2.4-2.6 L  EDUCATION NEEDS:   No education needs identified at this time  [name], RD, LDN Pager #: [phone] After-Hours Pager #: [phone]

## 2016-04-06 NOTE — Progress Notes (Signed)
RT placed patient on VM 10L 45% due to ABG PO2 55. RN aware.

## 2016-04-06 NOTE — Progress Notes (Signed)
TCTS BRIEF SICU PROGRESS NOTE  6 Days Post-Op  S/P Procedure(s) (LRB): REPLACEMENT OF ASCENDING AORTA USING A 85mm HEMASHIELD PLATINUM VASCULAR GRAFT (N/A)   Awake and alert, follows commands NSR w/ stable BP although trending up > [id number] systolic Breathing comfortably w/ O2 sats 92-94% on 45% FM Excellent UOP  Plan: Wean Epi drip.  Supplement potassium  Rexene Alberts, MD 04/06/2016 7:34 PM

## 2016-04-07 ENCOUNTER — Inpatient Hospital Stay (HOSPITAL_COMMUNITY): Payer: 59

## 2016-04-07 LAB — GLUCOSE, CAPILLARY
Glucose-Capillary: 100 mg/dL — ABNORMAL HIGH (ref 65–99)
Glucose-Capillary: 108 mg/dL — ABNORMAL HIGH (ref 65–99)
Glucose-Capillary: 108 mg/dL — ABNORMAL HIGH (ref 65–99)
Glucose-Capillary: 110 mg/dL — ABNORMAL HIGH (ref 65–99)
Glucose-Capillary: 113 mg/dL — ABNORMAL HIGH (ref 65–99)
Glucose-Capillary: 96 mg/dL (ref 65–99)
Glucose-Capillary: 98 mg/dL (ref 65–99)

## 2016-04-07 LAB — ECHOCARDIOGRAM LIMITED
Height: 72 in
Weight: 4402.15 oz

## 2016-04-07 LAB — COMPREHENSIVE METABOLIC PANEL
ALT: 240 U/L — ABNORMAL HIGH (ref 17–63)
AST: 47 U/L — ABNORMAL HIGH (ref 15–41)
Albumin: 2.2 g/dL — ABNORMAL LOW (ref 3.5–5.0)
Alkaline Phosphatase: 100 U/L (ref 38–126)
Anion gap: 9 (ref 5–15)
BUN: 11 mg/dL (ref 6–20)
CO2: 36 mmol/L — ABNORMAL HIGH (ref 22–32)
Calcium: 8.5 mg/dL — ABNORMAL LOW (ref 8.9–10.3)
Chloride: 92 mmol/L — ABNORMAL LOW (ref 101–111)
Creatinine, Ser: 1 mg/dL (ref 0.61–1.24)
GFR calc Af Amer: >60 mL/min (ref >60)
GFR calc non Af Amer: >60 mL/min (ref >60)
Glucose, Bld: 119 mg/dL — ABNORMAL HIGH (ref 65–99)
Potassium: 3.2 mmol/L — ABNORMAL LOW (ref 3.5–5.1)
Sodium: 137 mmol/L (ref 135–145)
Total Bilirubin: 1.1 mg/dL (ref 0.3–1.2)
Total Protein: 5.1 g/dL — ABNORMAL LOW (ref 6.5–8.1)

## 2016-04-07 LAB — MAGNESIUM: Magnesium: 2.2 mg/dL (ref 1.7–2.4)

## 2016-04-07 LAB — LACTATE DEHYDROGENASE: LDH: 271 U/L — ABNORMAL HIGH (ref 98–192)

## 2016-04-07 LAB — CBC
HCT: 27 % — ABNORMAL LOW (ref 39.0–52.0)
Hemoglobin: 9.1 g/dL — ABNORMAL LOW (ref 13.0–17.0)
MCH: 31.5 pg (ref 26.0–34.0)
MCHC: 33.7 g/dL (ref 30.0–36.0)
MCV: 93.4 fL (ref 78.0–100.0)
Platelets: 189 10*3/uL (ref 150–400)
RBC: 2.89 MIL/uL — ABNORMAL LOW (ref 4.22–5.81)
RDW: 14.3 % (ref 11.5–15.5)
WBC: 9.2 10*3/uL (ref 4.0–10.5)

## 2016-04-07 LAB — CULTURE, BLOOD (ROUTINE X 2)
Culture: NO GROWTH
Culture: NO GROWTH

## 2016-04-07 LAB — BLOOD GAS, ARTERIAL
Acid-Base Excess: 13.9 mmol/L — ABNORMAL HIGH (ref 0.0–2.0)
Bicarbonate: 38.4 mEq/L — ABNORMAL HIGH (ref 20.0–24.0)
Drawn by: 437071
FIO2: 0.55
O2 Saturation: 89.4 %
Patient temperature: 100
TCO2: 40 mmol/L (ref 0–100)
pCO2 arterial: 53.9 mmHg — ABNORMAL HIGH (ref 35.0–45.0)
pH, Arterial: 7.47 — ABNORMAL HIGH (ref 7.350–7.450)
pO2, Arterial: 57.2 mmHg — ABNORMAL LOW (ref 80.0–100.0)

## 2016-04-07 LAB — POTASSIUM: Potassium: 4 mmol/L (ref 3.5–5.1)

## 2016-04-07 LAB — CARBOXYHEMOGLOBIN
Carboxyhemoglobin: 1.3 % (ref 0.5–1.5)
Methemoglobin: 1.2 % (ref 0.0–1.5)
O2 Saturation: 59.1 %
Total hemoglobin: 8.8 g/dL — ABNORMAL LOW (ref 13.5–18.0)

## 2016-04-07 MED ORDER — LABETALOL HCL 5 MG/ML IV SOLN
10.0000 mg | INTRAVENOUS | Status: DC | PRN
  Administered 2016-04-10 – 2016-04-11 (×2): 10 mg via INTRAVENOUS
  Filled 2016-04-07 (×2): qty 4

## 2016-04-07 MED ORDER — CHLORHEXIDINE GLUCONATE 0.12 % MT SOLN
OROMUCOSAL | Status: AC
  Filled 2016-04-07: qty 15

## 2016-04-07 MED ORDER — FUROSEMIDE 10 MG/ML IJ SOLN
40.0000 mg | Freq: Two times a day (BID) | INTRAMUSCULAR | Status: DC
  Administered 2016-04-07 – 2016-04-08 (×2): 40 mg via INTRAVENOUS
  Filled 2016-04-07 (×2): qty 4

## 2016-04-07 MED ORDER — POTASSIUM CHLORIDE 10 MEQ/50ML IV SOLN
10.0000 meq | INTRAVENOUS | Status: AC
  Administered 2016-04-07 (×3): 10 meq via INTRAVENOUS
  Filled 2016-04-07 (×3): qty 50

## 2016-04-07 MED ORDER — MORPHINE SULFATE (PF) 2 MG/ML IV SOLN: 1.0000 mg | INTRAVENOUS | Status: DC | PRN

## 2016-04-07 MED ORDER — AMIODARONE HCL 200 MG PO TABS: 200.0000 mg | ORAL_TABLET | Freq: Two times a day (BID) | ORAL | Status: DC

## 2016-04-07 MED ORDER — FENTANYL CITRATE (PF) 100 MCG/2ML IJ SOLN: 50.0000 ug | INTRAMUSCULAR | Status: DC | PRN

## 2016-04-07 MED ORDER — PANTOPRAZOLE SODIUM 40 MG PO TBEC
40.0000 mg | DELAYED_RELEASE_TABLET | Freq: Every day | ORAL | Status: DC
  Administered 2016-04-08 – 2016-04-16 (×9): 40 mg via ORAL
  Filled 2016-04-07 (×9): qty 1

## 2016-04-07 MED ORDER — POTASSIUM CHLORIDE 10 MEQ/50ML IV SOLN
10.0000 meq | INTRAVENOUS | Status: AC | PRN
  Administered 2016-04-07 (×3): 10 meq via INTRAVENOUS

## 2016-04-07 NOTE — Evaluation (Signed)
Clinical/Bedside Swallow Evaluation Patient Details  Name: Jon Rodriguez MRN: [id number] Date of Birth: [birth year]  Today's Date: 04/07/2016 Time: SLP Start Time (ACUTE ONLY): 1420 SLP Stop Time (ACUTE ONLY): 1440 SLP Time Calculation (min) (ACUTE ONLY): 20 min  Past Medical History:  Past Medical History  Diagnosis Date  . Hypertension   . High cholesterol    Past Surgical History:  Past Surgical History  Procedure Laterality Date  . Replacement ascending aorta N/A 03/31/2016    Procedure: REPLACEMENT OF ASCENDING AORTA USING A 76mm HEMASHIELD PLATINUM VASCULAR GRAFT;  Surgeon: Ivin Poot, MD;  Location: [facility];  Service: Open Heart Surgery;  Laterality: N/A;   HPI:  55 year old morbidly obese nondiabetic male with history of treated hypertension and moderate smoking felt sudden tearing chest pain at 11 AM today while working out at a fitness center- gym. This did not resolve and he presented to the emergency department.Diagnosed with a tear in the ascending aorta s/p REPLACEMENT 7/9 with ETT 7/9-7/14.    Assessment / Plan / Recommendation Clinical Impression  Despite 5 day intubation, patient presents with a seemingly strong oropharyngeal swallow without overt indication of aspiration. Hoarse, breathy vocal quality indicative of decreased glottal closure does raise concern for increased risk of aspiration however at this time would recommend dysphagia 3 solids for eneragy conservation, thin liquids with strict aspiration precautions and close f/u. Educated patient and Therapist, sports.     Aspiration Risk  Moderate aspiration risk    Diet Recommendation Dysphagia 3 (Mech soft);Thin liquid   Liquid Administration via: Cup;Straw Medication Administration: Whole meds with puree Supervision: Patient able to self feed;Full supervision/cueing for compensatory strategies;Staff to assist with self feeding Compensations: Slow rate;Small sips/bites Postural Changes: Seated upright at 90 degrees     Other  Recommendations Oral Care Recommendations: Oral care BID   Follow up Recommendations  None    Frequency and Duration min 2x/week  1 week       Prognosis Prognosis for Safe Diet Advancement: Good      Swallow Study   General HPI: 55 year old morbidly obese nondiabetic male with history of treated hypertension and moderate smoking felt sudden tearing chest pain at 11 AM today while working out at a fitness center- gym. This did not resolve and he presented to the emergency department.Diagnosed with a tear in the ascending aorta s/p REPLACEMENT 7/9 with ETT 7/9-7/14.  Type of Study: Bedside Swallow Evaluation Diet Prior to this Study: NPO Temperature Spikes Noted: No History of Recent Intubation: Yes Length of Intubations (days): 5 days Date extubated: 04/06/16 Behavior/Cognition: Alert;Cooperative;Pleasant mood Oral Cavity Assessment: Within Functional Limits Oral Care Completed by SLP: No Oral Cavity - Dentition: Adequate natural dentition Vision: Functional for self-feeding Self-Feeding Abilities: Able to feed self;Needs assist Patient Positioning: Upright in bed Baseline Vocal Quality: Breathy;Hoarse;Low vocal intensity Volitional Cough: Strong Volitional Swallow: Able to elicit    Oral/Motor/Sensory Function Overall Oral Motor/Sensory Function: Within functional limits   Ice Chips Ice chips: Within functional limits Presentation: Spoon   Thin Liquid Thin Liquid: Within functional limits Presentation: Cup;Self Fed;Straw    Nectar Thick Nectar Thick Liquid: Not tested   Honey Thick Honey Thick Liquid: Not tested   Puree Puree: Within functional limits Presentation: Spoon   Solid   GO  Marybel Alcott MA, CCC-SLP [phone]  Solid: Within functional limits        Adalida Garver Meryl 04/07/2016,2:49 PM

## 2016-04-07 NOTE — Progress Notes (Signed)
Rehab Admissions Coordinator Note:  Patient was screened by Cleatrice Burke for appropriateness for an Inpatient Acute Rehab Consult per PT recommendation.   At this time, we are recommending Inpatient Rehab consult. Please place order.  Cleatrice Burke 04/07/2016, 4:45 PM  I can be reached at [phone].

## 2016-04-07 NOTE — Progress Notes (Signed)
  Echocardiogram 2D Echocardiogram has been performed.  Jon Rodriguez 04/07/2016, 3:59 PM

## 2016-04-07 NOTE — Progress Notes (Signed)
  Echocardiogram 2D Echocardiogram has been performed.  Donata Clay 04/07/2016, 4:01 PM

## 2016-04-07 NOTE — Progress Notes (Signed)
PULMONARY / CRITICAL CARE MEDICINE   Name: Jon Rodriguez MRN: [id number] DOB: 1961-03-17    ADMISSION DATE:  03/31/2016 CONSULTATION DATE:  04/02/2016  REFERRING MD: Dr. Prescott Gum  CHIEF COMPLAINT:  Ventilator and critical care management  HISTORY OF PRESENT ILLNESS:   55 year old morbidly obese nondiabetic male with history of treated hypertension and moderate smoking felt sudden tearing chest pain at 11 AM today while working out at a fitness center- gym. This did not resolve and he presented to the emergency department. EKG was nonspecific and trach enzymes were first negative and minimally elevated. CT scan rule out pulmonary embolus was negative. The patient was then re-dosed with contrast for a CTA angiogram and this demonstrates a tear in the ascending aorta extending through the arch and descending thoracic aorta down to the aortic abdominal bifurcation. The innominate artery is not involved by the dissection, the left carotid is not involved, the left subclavian artery appears to be involved with the dissection. The renal and visceral vessels come off the true lumen. The ascending aorta and arch are dilated. The descending thoracic aorta appears to be of normal caliber. There is no clear pericardial effusion. He is now in [place] s/p repair of type A aortic dissection with a hemashield platinum vascular graft. He is currently requiring high oxygen demands as well as high needs for sedation.   SUBJECTIVE:  Increased O2 requirements overnight.  Denies chest congestion.  Chest pain improving.  VITAL SIGNS: BP 120/53 mmHg  Pulse 80  Temp(Src) 98.7 F (37.1 C) (Axillary)  Resp 12  Ht 6' (1.829 m)  Wt 275 lb 2.2 oz (124.8 kg)  BMI 37.31 kg/m2  SpO2 98%  VENTILATOR SETTINGS: Vent Mode:  [-] CPAP;PSV FiO2 (%):  [45 %-100 %] 100 % Set Rate:  [14 bmp] 14 bmp Vt Set:  [620 mL] 620 mL PEEP:  [5 cmH20-8 cmH20] 5 cmH20 Pressure Support:  [8 cmH20] 8 cmH20 Plateau Pressure:  [22 cmH20]  22 cmH20  INTAKE / OUTPUT: I/O last 3 completed shifts: In: 2970.2 [I.V.:1590.2; NG/GT:530; IV Piggyback:850] Out: [id number]; Emesis/NG output:400; Chest Tube:170]  PHYSICAL EXAMINATION: General: alert Neuro: follows commands HEENT: weak voice quality, no stridor Chest: basilar crackles Cardiac: regular Abd: soft, non tender Ext: 1+ edema Skin: no rashes  LABS:  BMET  Recent Labs Lab 04/05/16 0425  04/06/16 0415 04/06/16 1732 04/07/16 0445  NA 136  < > 137 137 137  K 4.2  < > 3.3* 3.3* 3.2*  CL 102  < > 96* 88* 92*  CO2 28  --  32  --  36*  BUN 19  < > 14 14 11   CREATININE 1.23  < > 1.17 1.10 1.00  GLUCOSE 108*  < > 119* 175* 119*  < > = values in this interval not displayed.  Electrolytes  Recent Labs Lab 04/05/16 0425 04/06/16 0415 04/07/16 0445  CALCIUM 7.7* 8.1* 8.5*  MG 2.0 2.3 2.2    CBC  Recent Labs Lab 04/05/16 0425  04/06/16 0415 04/06/16 1732 04/07/16 0445  WBC 8.4  --  6.9  --  9.2  HGB 9.1*  < > 8.3* 8.2* 9.1*  HCT 26.8*  < > 24.9* 24.0* 27.0*  PLT 91*  --  134*  --  189  < > = values in this interval not displayed.  Coag's  Recent Labs Lab 03/31/16 1702 04/01/16 0450 04/01/16 0955 04/02/16 0406  APTT 33 53*  --  35  INR 1.12 1.13 1.18  1.35    Sepsis Markers No results for input(s): LATICACIDVEN, PROCALCITON, O2SATVEN in the last 168 hours.  ABG  Recent Labs Lab 04/06/16 1609 04/06/16 2129 04/07/16 0315  PHART 7.492* 7.455* 7.470*  PCO2ART 53.1* 56.3* 53.9*  PO2ART 55.0* 66.9* 57.2*    Liver Enzymes  Recent Labs Lab 04/05/16 0425 04/06/16 0415 04/07/16 0445  AST 117* 50* 47*  ALT 468* 314* 240*  ALKPHOS 73 17* 100  BILITOT 0.8 0.6 1.1  ALBUMIN 2.3* 2.0* 2.2*    Cardiac Enzymes No results for input(s): TROPONINI, PROBNP in the last 168 hours.  Glucose  Recent Labs Lab 04/06/16 0759 04/06/16 1228 04/06/16 1643 04/06/16 1956 04/06/16 2344 04/07/16 0347  GLUCAP 112* 110* 118* 106* 96  100*    Imaging Dg Abd 1 View  04/06/2016  CLINICAL DATA:  Encounter for feeding tube placement. EXAM: ABDOMEN - 1 VIEW COMPARISON:  04/04/2016 FINDINGS: Enteric feeding tube passes below the diaphragm. Tip projects in the distal stomach. It does not appear to enter the duodenum as it did on the prior exam. IMPRESSION: Enteric feeding tube tip projects in the distal stomach. Electronically Signed   By: Lajean Manes M.D.   On: 04/06/2016 16:37     STUDIES:    CULTURES: 04/01/16: MRSA nasal swab: Negative 04/02/16: Blood Cultures: pending 04/03/16: Sputum Culture: negative Urine 7/11 >> negative   ANTIBIOTICS: Cefuroxime: 04/01/16>>> 04/03/16 Vancomycin: 04/01/16 >>> 04/03/16 Ceftazidime: 04/03/16 >>>   SIGNIFICANT EVENTS: 7/10: Consulted for critical care management s/p replacement of ascending aorta with a hemashield platinum vascular graft. 7/11:  Unable to wean vent this AM, will recheck ABG this afternoon and attempt to wean. Fever spiked overnight and pan cultured. Started on ceftazidine overnight. Tolerating vent on current sedation.  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  Remained on vent post-op.  ASSESSMENT / PLAN:  PULMONARY A: Acute Respiratory Failure with Hypoxemia Tobacco abuse  P:   Oxygen to keep SpO2 > 92% BiPAP prn F/u CXR  CARDIOVASCULAR A:  Shock, presumed cardiogenic + effects of sedation S/P repair of Type A aortic dissection P:  BP parameters per CT surgery  RENAL A:   AKI P:   Monitor renal fx, urine  outpt  GASTROINTESTINAL A:   Shock liver >> improving P:   F/u LFTs  HEMATOLOGIC A:   Anemia Thrombocytopenia, slowly improving P:  F/u CBC  INFECTIOUS A:   Possible PNA, unlikely  Fever P:   Empiric coverage for possible PNA, particularly while he is unstable.  Follow cx data and adjust abx as indicated Continue to monitor fever curve  ENDOCRINE A:   Hyperglycemia P:   Lantus 20 units BID + SSI   NEUROLOGIC A:   Pain control P:   Prn fentanyl  CC time 31 minutes.  Chesley Mires, MD [facility] Pulmonary/Critical Care 04/07/2016, 7:33 AM Pager:  [phone] After 3pm call: [phone]

## 2016-04-07 NOTE — Progress Notes (Signed)
Pt placed on NN 6 Lpm for Speech therapy to access.  Sat remained 91% or better.

## 2016-04-07 NOTE — Progress Notes (Signed)
TCTS BRIEF SICU PROGRESS NOTE  7 Days Post-Op  S/P Procedure(s) (LRB): REPLACEMENT OF ASCENDING AORTA USING A 59mm HEMASHIELD PLATINUM VASCULAR GRAFT (N/A)   Excellent progress Up in chair much of the day Passed swallowing eval and tolerating oral diet NSR w/ stable BP - trending up Breathing comfortably w/ O2 sats 92-95% on 6 L/min via [place] Diuresing very well  Plan: Continue current plan.  Add labetalol for hypertension  Rexene Alberts, MD 04/07/2016 6:14 PM

## 2016-04-07 NOTE — Evaluation (Signed)
Physical Therapy Evaluation Patient Details Name: Jon Rodriguez MRN: [id number] DOB: Feb 14, 1961 Today's Date: 04/07/2016   History of Present Illness  Patient is a 55 yo male admitted 03/31/16 with chest pain.  Patient with ascending aortic dissection.  Emergent surgery for replacement of ascending aorta.  Patient extubated 04/06/16.    Clinical Impression  Patient presents with problems listed below.  Will benefit from acute PT to maximize functional mobility.  Patient extremely weak, requiring +2 assist for mobility.  Recommend Inpatient Rehab consult to maximize functional independence prior to return home.    Follow Up Recommendations CIR;Supervision for mobility/OOB    Equipment Recommendations  Rolling walker with 5" wheels;3in1 (PT)    Recommendations for Other Services Rehab consult     Precautions / Restrictions Precautions Precautions: Sternal;Fall Precaution Comments: Reviewed sternal precautions Restrictions Weight Bearing Restrictions: Yes Other Position/Activity Restrictions: Sternal precautions      Mobility  Bed Mobility Overal bed mobility: Needs Assistance;+2 for physical assistance Bed Mobility: Sit to Supine       Sit to supine: Max assist;+2 for physical assistance   General bed mobility comments: Verbal cues for technique.  Assist to lower trunk and to bring BLE's onto bed.  Transfers Overall transfer level: Needs assistance Equipment used: 2 person hand held assist Transfers: Sit to/from [facility] Sit to Stand: Max assist;+2 physical assistance Stand pivot transfers: Mod assist;+2 physical assistance       General transfer comment: Verbal cues to scoot to edge of chair.  Cues for hand placement.  Max assist to power up to stance.  Patient able to take several shuffle steps to pivot to bed.  Patient's knees buckling due to weakness - blocked knees and assisted patient onto bed.  After rest, patient stood and took several steps to  left side for positioning to return to supine.  Ambulation/Gait             General Gait Details: Unable  Stairs            Wheelchair Mobility    Modified Rankin (Stroke Patients Only)       Balance Overall balance assessment: Needs assistance Sitting-balance support: No upper extremity supported;Feet supported Sitting balance-Leahy Scale: Fair     Standing balance support: Bilateral upper extremity supported Standing balance-Leahy Scale: Poor                               Pertinent Vitals/Pain Pain Assessment: No/denies pain    Home Living Family/patient expects to be discharged to:: Private residence Living Arrangements: Children;Non-relatives/Friends Available Help at Discharge: Family;Friend(s);Available PRN/intermittently (Per patient, son and roommate work) Type of Home: House Home Access: Stairs to enter Entrance Stairs-Rails: Right Entrance Stairs-Number of Steps: 2 Home Layout: One level Home Equipment: None ("walking stick")      Prior Function Level of Independence: Independent               Hand Dominance        Extremity/Trunk Assessment   Upper Extremity Assessment: Generalized weakness           Lower Extremity Assessment: Generalized weakness         Communication   Communication: Expressive difficulties;Other (comment) (Difficulty hearing patient due to respiratory issues)  Cognition Arousal/Alertness: Awake/alert Behavior During Therapy: WFL for tasks assessed/performed Overall Cognitive Status: Within Functional Limits for tasks assessed  General Comments General comments (skin integrity, edema, etc.): O2 sats remained at/above 90% during transfers on non-rebreather mask.    Exercises        Assessment/Plan    PT Assessment Patient needs continued PT services  PT Diagnosis Difficulty walking;Generalized weakness   PT Problem List Decreased strength;Decreased  activity tolerance;Decreased balance;Decreased mobility;Decreased knowledge of use of DME;Decreased knowledge of precautions;Cardiopulmonary status limiting activity;Obesity  PT Treatment Interventions DME instruction;Gait training;Functional mobility training;Therapeutic activities;Therapeutic exercise;Patient/family education   PT Goals (Current goals can be found in the Care Plan section) Acute Rehab PT Goals Patient Stated Goal: To get stronger PT Goal Formulation: With patient Time For Goal Achievement: 04/21/16 Potential to Achieve Goals: Good    Frequency Min 3X/week   Barriers to discharge Decreased caregiver support Son and roommate work during days.  Patient would be home alone.    Co-evaluation               End of Session Equipment Utilized During Treatment: Oxygen Activity Tolerance: Patient limited by fatigue Patient left: in bed;with call bell/phone within reach Nurse Communication: Mobility status         Time: [id number] PT Time Calculation (min) (ACUTE ONLY): 14 min   Charges:   PT Evaluation $PT Eval High Complexity: 1 Procedure     PT G CodesDespina Pole 05-May-2016, 2:42 PM Carita Pian. Sanjuana Kava, [place] Pager [phone]

## 2016-04-07 NOTE — Progress Notes (Signed)
      [address]       [place] [address]             [phone]        CARDIOTHORACIC SURGERY PROGRESS NOTE   R7 Days Post-Op Procedure(s) (LRB): REPLACEMENT OF ASCENDING AORTA USING A 43mm HEMASHIELD PLATINUM VASCULAR GRAFT (N/A)  Subjective: Looks remarkably good.  Voice remains very hoarse.  Denies pain, SOB.  Wants to get up out of bed  Objective: Vital signs: BP Readings from Last 1 Encounters:  04/07/16 126/62   Pulse Readings from Last 1 Encounters:  04/07/16 80   Resp Readings from Last 1 Encounters:  04/07/16 12   Temp Readings from Last 1 Encounters:  04/07/16 98.9 F (37.2 C) Axillary    Hemodynamics:    Physical Exam:  Rhythm:   sinus  Breath sounds: clear  Heart sounds:  RRR  Incisions:  Dressing dry, intact  Abdomen:  Soft, non-distended, non-tender  Extremities:  Warm, well-perfused  Chest tubes:  low volume thin serosanguinous output, no air leak    Intake/Output from previous day: 07/14 0701 - 07/15 0700 In: 1543.6 [I.V.:783.6; NG/GT:110; IV Piggyback:650] Out: 7975 [Urine:7975] Intake/Output this shift: Total I/O In: 144.5 [I.V.:94.5; IV Piggyback:50] Out: 250 [Urine:200; Chest Tube:50]  Lab Results:  CBC: Recent Labs  04/06/16 0415 04/06/16 1732 04/07/16 0445  WBC 6.9  --  9.2  HGB 8.3* 8.2* 9.1*  HCT 24.9* 24.0* 27.0*  PLT 134*  --  189    BMET:  Recent Labs  04/06/16 0415 04/06/16 1732 04/07/16 0445  NA 137 137 137  K 3.3* 3.3* 3.2*  CL 96* 88* 92*  CO2 32  --  36*  GLUCOSE 119* 175* 119*  BUN 14 14 11   CREATININE 1.17 1.10 1.00  CALCIUM 8.1*  --  8.5*     PT/INR:  No results for input(s): LABPROT, INR in the last 72 hours.  CBG (last 3)   Recent Labs  04/06/16 2344 04/07/16 0347 04/07/16 0819  GLUCAP 96 100* 108*    ABG    Component Value Date/Time   PHART 7.470* 04/07/2016 0315   PCO2ART 53.9* 04/07/2016 0315   PO2ART 57.2* 04/07/2016 0315   HCO3 38.4* 04/07/2016 0315   TCO2 40.0 04/07/2016 0315   ACIDBASEDEF 2.0 04/01/2016 1205   O2SAT 59.1 04/07/2016 0350    CXR: LLL opacity c/w atelectasis +/- effusion  Assessment/Plan: S/P Procedure(s) (LRB): REPLACEMENT OF ASCENDING AORTA USING A 58mm HEMASHIELD PLATINUM VASCULAR GRAFT (N/A)  Overall stable now POD7 Maintaining NSR w/ stable BP Breathing comfortably w/ O2 sats 99% on FM Expected post op acute blood loss anemia, stable Expected post op atelectasis, L > R Expected post op volume excess, diuresing remarkably well Hoarseness of voice, high risk for aspiration Acute physical deconditioning Hypokalemia - diuretic induced   Mobilize  Continue diuresis but decrease lasix dose   Supplement potassium  Continue tube feeds and keep NPO  SLP eval for swallowing assessment  PT consult   Rexene Alberts, MD 04/07/2016 11:00 AM

## 2016-04-08 ENCOUNTER — Inpatient Hospital Stay (HOSPITAL_COMMUNITY): Payer: 59

## 2016-04-08 LAB — CBC
HCT: 27 % — ABNORMAL LOW (ref 39.0–52.0)
Hemoglobin: 9 g/dL — ABNORMAL LOW (ref 13.0–17.0)
MCH: 31.5 pg (ref 26.0–34.0)
MCHC: 33.3 g/dL (ref 30.0–36.0)
MCV: 94.4 fL (ref 78.0–100.0)
Platelets: 271 10*3/uL (ref 150–400)
RBC: 2.86 MIL/uL — ABNORMAL LOW (ref 4.22–5.81)
RDW: 13.8 % (ref 11.5–15.5)
WBC: 8.8 10*3/uL (ref 4.0–10.5)

## 2016-04-08 LAB — GLUCOSE, CAPILLARY
Glucose-Capillary: 100 mg/dL — ABNORMAL HIGH (ref 65–99)
Glucose-Capillary: 114 mg/dL — ABNORMAL HIGH (ref 65–99)
Glucose-Capillary: 105 mg/dL — ABNORMAL HIGH (ref 65–99)
Glucose-Capillary: 112 mg/dL — ABNORMAL HIGH (ref 65–99)
Glucose-Capillary: 115 mg/dL — ABNORMAL HIGH (ref 65–99)

## 2016-04-08 LAB — BASIC METABOLIC PANEL
ANION GAP: 8 (ref 5–15)
BUN: 16 mg/dL (ref 6–20)
CHLORIDE: 93 mmol/L — AB (ref 101–111)
CO2: 35 mmol/L — AB (ref 22–32)
Calcium: 8.5 mg/dL — ABNORMAL LOW (ref 8.9–10.3)
Creatinine, Ser: 1.09 mg/dL (ref 0.61–1.24)
GFR calc Af Amer: >60 mL/min (ref >60)
GLUCOSE: 102 mg/dL — AB (ref 65–99)
POTASSIUM: 2.9 mmol/L — AB (ref 3.5–5.1)
Sodium: 136 mmol/L (ref 135–145)

## 2016-04-08 MED ORDER — POTASSIUM CHLORIDE 10 MEQ/50ML IV SOLN
10.0000 meq | INTRAVENOUS | Status: AC
  Administered 2016-04-08 (×3): 10 meq via INTRAVENOUS
  Filled 2016-04-08 (×3): qty 50

## 2016-04-08 MED ORDER — FUROSEMIDE 40 MG PO TABS
40.0000 mg | ORAL_TABLET | Freq: Two times a day (BID) | ORAL | Status: DC
  Administered 2016-04-08 – 2016-04-11 (×5): 40 mg via ORAL
  Filled 2016-04-08 (×5): qty 1

## 2016-04-08 MED ORDER — INSULIN ASPART 100 UNIT/ML ~~LOC~~ SOLN
0.0000 [IU] | Freq: Three times a day (TID) | SUBCUTANEOUS | Status: DC
  Administered 2016-04-09: 2 [IU] via SUBCUTANEOUS

## 2016-04-08 MED ORDER — CETYLPYRIDINIUM CHLORIDE 0.05 % MT LIQD
7.0000 mL | Freq: Two times a day (BID) | OROMUCOSAL | Status: DC
  Administered 2016-04-08 – 2016-04-15 (×11): 7 mL via OROMUCOSAL

## 2016-04-08 MED ORDER — MOVING RIGHT ALONG BOOK
Freq: Once | Status: AC
  Administered 2016-04-08: 1
  Filled 2016-04-08: qty 1

## 2016-04-08 MED ORDER — METOPROLOL TARTRATE 25 MG PO TABS
25.0000 mg | ORAL_TABLET | Freq: Two times a day (BID) | ORAL | Status: DC
  Administered 2016-04-08 – 2016-04-10 (×5): 25 mg via ORAL
  Filled 2016-04-08 (×5): qty 1

## 2016-04-08 MED ORDER — POTASSIUM CHLORIDE 10 MEQ/50ML IV SOLN
10.0000 meq | INTRAVENOUS | Status: AC
  Administered 2016-04-08 (×3): 10 meq via INTRAVENOUS
  Filled 2016-04-08: qty 50

## 2016-04-08 MED ORDER — ACETAMINOPHEN 325 MG PO TABS: 650.0000 mg | ORAL_TABLET | Freq: Four times a day (QID) | ORAL | Status: DC | PRN

## 2016-04-08 MED ORDER — SODIUM CHLORIDE 0.9% FLUSH
3.0000 mL | Freq: Two times a day (BID) | INTRAVENOUS | Status: DC
  Administered 2016-04-08: 3 mL via INTRAVENOUS

## 2016-04-08 MED ORDER — POTASSIUM CHLORIDE CRYS ER 20 MEQ PO TBCR
40.0000 meq | EXTENDED_RELEASE_TABLET | Freq: Two times a day (BID) | ORAL | Status: AC
  Administered 2016-04-08 – 2016-04-09 (×4): 40 meq via ORAL
  Filled 2016-04-08 (×4): qty 2

## 2016-04-08 MED ORDER — ENOXAPARIN SODIUM 40 MG/0.4ML ~~LOC~~ SOLN
40.0000 mg | SUBCUTANEOUS | Status: DC
  Administered 2016-04-08 – 2016-04-15 (×8): 40 mg via SUBCUTANEOUS
  Filled 2016-04-08 (×7): qty 0.4

## 2016-04-08 MED ORDER — TRAMADOL HCL 50 MG PO TABS
50.0000 mg | ORAL_TABLET | ORAL | Status: DC | PRN
  Administered 2016-04-08 – 2016-04-16 (×8): 100 mg via ORAL
  Filled 2016-04-08 (×8): qty 2

## 2016-04-08 MED ORDER — SODIUM CHLORIDE 0.9% FLUSH: 3.0000 mL | INTRAVENOUS | Status: DC | PRN

## 2016-04-08 MED ORDER — SODIUM CHLORIDE 0.9 % IV SOLN: 250.0000 mL | INTRAVENOUS | Status: DC | PRN

## 2016-04-08 MED ORDER — OXYCODONE HCL 5 MG PO TABS
5.0000 mg | ORAL_TABLET | ORAL | Status: DC | PRN
  Administered 2016-04-10 – 2016-04-16 (×17): 10 mg via ORAL
  Filled 2016-04-08 (×17): qty 2

## 2016-04-08 NOTE — Progress Notes (Addendum)
      [phone]       [place] [address]             [phone]        CARDIOTHORACIC SURGERY PROGRESS NOTE   R8 Days Post-Op Procedure(s) (LRB): REPLACEMENT OF ASCENDING AORTA USING A 75mm HEMASHIELD PLATINUM VASCULAR GRAFT (N/A)  Subjective: Looks remarkably good.  Minimal pain.  Breathing comfortably.  Still quite weak.  Objective: Vital signs: BP Readings from Last 1 Encounters:  04/08/16 140/77   Pulse Readings from Last 1 Encounters:  04/08/16 72   Resp Readings from Last 1 Encounters:  04/08/16 14   Temp Readings from Last 1 Encounters:  04/08/16 99 F (37.2 C) Axillary    Hemodynamics:    Physical Exam:  Rhythm:   sinus  Breath sounds: clear  Heart sounds:  RRR  Incisions:  Clean and dry  Abdomen:  Soft, non-distended, non-tender  Extremities:  Warm, well-perfused    Intake/Output from previous day: 07/15 0701 - 07/16 0700 In: 682.6 [P.O.:120; I.V.:262.6; IV Piggyback:300] Out: 2725 [Urine:2625; Chest Tube:100] Intake/Output this shift: Total I/O In: 290 [P.O.:240; IV Piggyback:50] Out: 160 [Urine:160]  Lab Results:  CBC: Recent Labs  04/07/16 0445 04/08/16 0426  WBC 9.2 8.8  HGB 9.1* 9.0*  HCT 27.0* 27.0*  PLT 189 271    BMET:  Recent Labs  04/07/16 0445 04/07/16 1617 04/08/16 0426  NA 137  --  136  K 3.2* 4.0 2.9*  CL 92*  --  93*  CO2 36*  --  35*  GLUCOSE 119*  --  102*  BUN 11  --  16  CREATININE 1.00  --  1.09  CALCIUM 8.5*  --  8.5*     PT/INR:  No results for input(s): LABPROT, INR in the last 72 hours.  CBG (last 3)   Recent Labs  04/07/16 2340 04/08/16 0338 04/08/16 0810  GLUCAP 98 100* 114*    ABG    Component Value Date/Time   PHART 7.470* 04/07/2016 0315   PCO2ART 53.9* 04/07/2016 0315   PO2ART 57.2* 04/07/2016 0315   HCO3 38.4* 04/07/2016 0315   TCO2 40.0 04/07/2016 0315   ACIDBASEDEF 2.0 04/01/2016 1205   O2SAT 59.1 04/07/2016 0350    CXR: PORTABLE CHEST 1  VIEW  COMPARISON: Radiograph of April 07, 2016.  FINDINGS: Stable cardiomegaly. Sternotomy wires are noted. No pneumothorax is noted. Feeding tube has been removed. Stable large left basilar opacity is noted concerning for infiltrate or atelectasis with associated pleural effusion. Minimal right basilar subsegmental atelectasis is noted. Bony thorax is unremarkable.  IMPRESSION: Feeding tube has been removed. Stable left basilar opacity as described above.   Electronically Signed  By: Marijo Conception, M.D.  On: 04/08/2016 08:01  Assessment/Plan: S/P Procedure(s) (LRB): REPLACEMENT OF ASCENDING AORTA USING A 22mm HEMASHIELD PLATINUM VASCULAR GRAFT (N/A)  Overall doing well POD8 Maintaining NSR w/ stable BP Breathing comfortably w/ O2 sats 96% on 6 L/min via [place] Expected post op acute blood loss anemia, stable Expected post op atelectasis, L > R Expected post op volume excess, diuresing remarkably well Acute physical deconditioning Hypokalemia - diuretic induced   Mobilize  Continue diuresis but decrease lasix dose   Increase metoprolol  Change CBG's and SSI to ac/hs  Supplement potassium  Possible d/c to Inpatient Rehab service later this week if he continues to improve  Rexene Alberts, MD 04/08/2016 10:31 AM

## 2016-04-08 NOTE — Progress Notes (Signed)
PULMONARY / CRITICAL CARE MEDICINE   Name: Jon Rodriguez MRN: [id number] DOB: 03-08-1961    ADMISSION DATE:  03/31/2016 CONSULTATION DATE:  04/02/2016  REFERRING MD: Dr. Prescott Gum  CHIEF COMPLAINT:  Ventilator and critical care management  HISTORY OF PRESENT ILLNESS:   55 year old morbidly obese nondiabetic male with history of treated hypertension and moderate smoking felt sudden tearing chest pain at 11 AM today while working out at a fitness center- gym. This did not resolve and he presented to the emergency department. EKG was nonspecific and trach enzymes were first negative and minimally elevated. CT scan rule out pulmonary embolus was negative. The patient was then re-dosed with contrast for a CTA angiogram and this demonstrates a tear in the ascending aorta extending through the arch and descending thoracic aorta down to the aortic abdominal bifurcation. The innominate artery is not involved by the dissection, the left carotid is not involved, the left subclavian artery appears to be involved with the dissection. The renal and visceral vessels come off the true lumen. The ascending aorta and arch are dilated. The descending thoracic aorta appears to be of normal caliber. There is no clear pericardial effusion. 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 He is currently requiring high oxygen demands as well as high needs for sedation.   SUBJECTIVE:  Feels better.  Not much cough.  Denies chest pain, congestion.  VITAL SIGNS: BP 138/67 mmHg  Pulse 72  Temp(Src) 98.8 F (37.1 C) (Axillary)  Resp 17  Ht 6' (1.829 m)  Wt 281 lb 1.4 oz (127.5 kg)  BMI 38.11 kg/m2  SpO2 94%  VENTILATOR SETTINGS: Vent Mode:  [-] PCV;BIPAP FiO2 (%):  [45 %-100 %] 50 % Set Rate:  [15 bmp] 15 bmp PEEP:  [5 cmH20] 5 cmH20  INTAKE / OUTPUT: I/O last 3 completed shifts: In: 1538.4 [P.O.:120; I.V.:668.4; IV Piggyback:750] Out: [id number];  Chest Tube:100]  PHYSICAL EXAMINATION: General: alert, sitting in chair Neuro: follows commands HEENT: no stridor Chest: no wheeze Cardiac: regular Abd: soft, non tender Ext: no edema Skin: no rashes  LABS:  BMET  Recent Labs Lab 04/06/16 0415 04/06/16 1732 04/07/16 0445 04/07/16 1617 04/08/16 0426  NA 137 137 137  --  136  K 3.3* 3.3* 3.2* 4.0 2.9*  CL 96* 88* 92*  --  93*  CO2 32  --  36*  --  35*  BUN 14 14 11   --  16  CREATININE 1.17 1.10 1.00  --  1.09  GLUCOSE 119* 175* 119*  --  102*    Electrolytes  Recent Labs Lab 04/05/16 0425 04/06/16 0415 04/07/16 0445 04/08/16 0426  CALCIUM 7.7* 8.1* 8.5* 8.5*  MG 2.0 2.3 2.2  --     CBC  Recent Labs Lab 04/06/16 0415 04/06/16 1732 04/07/16 0445 04/08/16 0426  WBC 6.9  --  9.2 8.8  HGB 8.3* 8.2* 9.1* 9.0*  HCT 24.9* 24.0* 27.0* 27.0*  PLT 134*  --  189 271    Coag's  Recent Labs Lab 04/01/16 0955 04/02/16 0406  APTT  --  35  INR 1.18 1.35    Sepsis Markers No results for input(s): LATICACIDVEN, PROCALCITON, O2SATVEN in the last 168 hours.  ABG  Recent Labs Lab 04/06/16 1609 04/06/16 2129 04/07/16 0315  PHART 7.492* 7.455* 7.470*  PCO2ART 53.1* 56.3* 53.9*  PO2ART 55.0* 66.9* 57.2*    Liver Enzymes  Recent Labs Lab 04/05/16 0425 04/06/16  [id number] 04/07/16 0445  AST 117* 50* 47*  ALT 468* 314* 240*  ALKPHOS 73 17* 100  BILITOT 0.8 0.6 1.1  ALBUMIN 2.3* 2.0* 2.2*    Cardiac Enzymes No results for input(s): TROPONINI, PROBNP in the last 168 hours.  Glucose  Recent Labs Lab 04/07/16 0819 04/07/16 1217 04/07/16 1710 04/07/16 1953 04/07/16 2340 04/08/16 0338  GLUCAP 108* 113* 108* 110* 98 100*    Imaging No results found.   STUDIES:    CULTURES: 04/01/16: MRSA nasal swab: Negative 04/02/16: Blood Cultures: pending 04/03/16: Sputum Culture: negative Urine 7/11 >> negative   ANTIBIOTICS: Cefuroxime: 04/01/16>>> 04/03/16 Vancomycin: 04/01/16 >>>  04/03/16 Ceftazidime: 04/03/16 >>>   SIGNIFICANT EVENTS: 7/10: Consulted for critical care management s/p replacement of ascending aorta with a hemashield platinum vascular graft. 7/11:  Unable to wean vent this AM, will recheck ABG this afternoon and attempt to wean. Fever spiked overnight and pan cultured. Started on ceftazidine overnight. Tolerating vent on current sedation.  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  Remained on vent post-op.  ASSESSMENT / PLAN:  PULMONARY A: Acute Respiratory Failure with Hypoxemia Tobacco abuse  P:   Oxygen to keep SpO2 > 92% BiPAP prn F/u CXR  CARDIOVASCULAR A:  Shock, presumed cardiogenic + effects of sedation S/P repair of Type A aortic dissection P:  BP parameters per CT surgery  RENAL A:   AKI P:   Monitor renal fx, urine outpt  GASTROINTESTINAL A:   Shock liver >> improving P:   F/u LFTs intermittently  HEMATOLOGIC A:   Anemia Thrombocytopenia >> resolved. P:  F/u CBC  INFECTIOUS A:   Fever with possible PNA. P:   Abx per primary team  ENDOCRINE A:   Hyperglycemia P:   Lantus 20 units BID + SSI   NEUROLOGIC A:   Pain control P:   Prn fentanyl  Much improved.  PCCM will sign off.  Please call if additional help needed.  Chesley Mires, MD [facility] Pulmonary/Critical Care 04/08/2016, 7:45 AM Pager:  [phone] After 3pm call: [phone]

## 2016-04-08 NOTE — Progress Notes (Signed)
Patient resting comfortably on 6LNC at this time with sats of 93%. Patient stated he doesn't feel as though he needs BIPAP at this time. RT informed patient BIPAP is in room if he starts having shortness of breath, or increased work of breathing we would have to place BIPAP on. Patient agrees to this. RN aware.

## 2016-04-08 NOTE — Progress Notes (Signed)
Speech Language Pathology Treatment: Dysphagia  Patient Details Name: Jon Rodriguez MRN: [id number] DOB: 03-23-1961 Today's Date: 04/08/2016 Time: [id number] SLP Time Calculation (min) (ACUTE ONLY): 8 min  Assessment / Plan / Recommendation Clinical Impression  SLP followed up for diet tolerance since diet initiation. Pt and RN both deny swallow difficulty post extubation. Pts vocal strength appearing improving per prior ST encounter notes. SLP observed upgraded PO trials including regular consistencies and nursing administration of medicines whole with thin liquids. No overt signs or symptoms of aspiration. Recommend diet upgrade to regular consistencies and thin liquids. No further ST needs identified.      HPI HPI: 55 year old morbidly obese nondiabetic male with history of treated hypertension and moderate smoking felt sudden tearing chest pain at 11 AM today while working out at a fitness center- gym. This did not resolve and he presented to the emergency department.Diagnosed with a tear in the ascending aorta s/p REPLACEMENT 7/9 with ETT 7/9-7/14.       SLP Plan         Recommendations  Diet recommendations: Regular;Thin liquid Liquids provided via: Cup;Straw Medication Administration: Whole meds with liquid Supervision: Patient able to self feed;Intermittent supervision to cue for compensatory strategies Compensations: Slow rate;Small sips/bites Postural Changes and/or Swallow Maneuvers: Seated upright 90 degrees;Upright 30-60 min after meal             Oral Care Recommendations: Oral care BID Follow up Recommendations: None     GO               Arvil Chaco MA, [place] Language Pathologist    Levi Aland 04/08/2016, 11:23 AM

## 2016-04-08 NOTE — Progress Notes (Signed)
TCTS BRIEF SICU PROGRESS NOTE  8 Days Post-Op  S/P Procedure(s) (LRB): REPLACEMENT OF ASCENDING AORTA USING A 32mm HEMASHIELD PLATINUM VASCULAR GRAFT (N/A)   Stable day  Plan: Continue current plan  Rexene Alberts, MD 04/08/2016 7:08 PM

## 2016-04-09 ENCOUNTER — Inpatient Hospital Stay (HOSPITAL_COMMUNITY): Payer: 59

## 2016-04-09 DIAGNOSIS — R1310 Dysphagia, unspecified: Secondary | ICD-10-CM

## 2016-04-09 DIAGNOSIS — J90 Pleural effusion, not elsewhere classified: Secondary | ICD-10-CM

## 2016-04-09 DIAGNOSIS — R5381 Other malaise: Secondary | ICD-10-CM

## 2016-04-09 DIAGNOSIS — I10 Essential (primary) hypertension: Secondary | ICD-10-CM

## 2016-04-09 DIAGNOSIS — I7103 Dissection of thoracoabdominal aorta: Secondary | ICD-10-CM

## 2016-04-09 DIAGNOSIS — I7100 Dissection of unspecified site of aorta: Secondary | ICD-10-CM | POA: Insufficient documentation

## 2016-04-09 DIAGNOSIS — R509 Fever, unspecified: Secondary | ICD-10-CM | POA: Insufficient documentation

## 2016-04-09 DIAGNOSIS — D62 Acute posthemorrhagic anemia: Secondary | ICD-10-CM | POA: Insufficient documentation

## 2016-04-09 DIAGNOSIS — R0682 Tachypnea, not elsewhere classified: Secondary | ICD-10-CM | POA: Insufficient documentation

## 2016-04-09 DIAGNOSIS — E669 Obesity, unspecified: Secondary | ICD-10-CM | POA: Insufficient documentation

## 2016-04-09 DIAGNOSIS — E876 Hypokalemia: Secondary | ICD-10-CM | POA: Insufficient documentation

## 2016-04-09 LAB — GLUCOSE, CAPILLARY
Glucose-Capillary: 112 mg/dL — ABNORMAL HIGH (ref 65–99)
Glucose-Capillary: 114 mg/dL — ABNORMAL HIGH (ref 65–99)
Glucose-Capillary: 125 mg/dL — ABNORMAL HIGH (ref 65–99)
Glucose-Capillary: 110 mg/dL — ABNORMAL HIGH (ref 65–99)

## 2016-04-09 LAB — BLOOD GAS, ARTERIAL
Bicarbonate: 38.5 mEq/L — ABNORMAL HIGH (ref 20.0–24.0)
Drawn by: 437071
FIO2: 0.45
O2 Saturation: 92.8 %
Patient temperature: 100.5
TCO2: 40.2 mmol/L (ref 0–100)
pCO2 arterial: 56.3 mmHg — ABNORMAL HIGH (ref 35.0–45.0)
pH, Arterial: 7.455 — ABNORMAL HIGH (ref 7.350–7.450)
pO2, Arterial: 66.9 mmHg — ABNORMAL LOW (ref 80.0–100.0)

## 2016-04-09 LAB — CBC
HCT: 28.7 % — ABNORMAL LOW (ref 39.0–52.0)
HEMOGLOBIN: 9.4 g/dL — AB (ref 13.0–17.0)
MCH: 30.9 pg (ref 26.0–34.0)
MCHC: 32.8 g/dL (ref 30.0–36.0)
MCV: 94.4 fL (ref 78.0–100.0)
PLATELETS: 375 10*3/uL (ref 150–400)
RBC: 3.04 MIL/uL — AB (ref 4.22–5.81)
RDW: 13.5 % (ref 11.5–15.5)
WBC: 8.5 10*3/uL (ref 4.0–10.5)

## 2016-04-09 LAB — BASIC METABOLIC PANEL
ANION GAP: 7 (ref 5–15)
BUN: 19 mg/dL (ref 6–20)
CHLORIDE: 94 mmol/L — AB (ref 101–111)
CO2: 34 mmol/L — ABNORMAL HIGH (ref 22–32)
Calcium: 8.5 mg/dL — ABNORMAL LOW (ref 8.9–10.3)
Creatinine, Ser: 0.97 mg/dL (ref 0.61–1.24)
Glucose, Bld: 107 mg/dL — ABNORMAL HIGH (ref 65–99)
POTASSIUM: 3.2 mmol/L — AB (ref 3.5–5.1)
SODIUM: 135 mmol/L (ref 135–145)

## 2016-04-09 MED ORDER — POTASSIUM CHLORIDE 10 MEQ/50ML IV SOLN
10.0000 meq | INTRAVENOUS | Status: AC
  Administered 2016-04-09 (×3): 10 meq via INTRAVENOUS
  Filled 2016-04-09: qty 50

## 2016-04-09 NOTE — CV Procedure (Signed)
  Cardiothoracic Surgery procedure note:   Left Thoracentesis   After informed consent obtained, the patient was positioned sitting on the side of her bed. The back was prepped with choroprep and draped using sterile technique. A time out was taken and the proper patient, proper side, and proper procedure were confirmed. 1% lidocaine with epinephrine was injected into the skin and subcutaneous tissue of the left lower back down to the pleural space. Upon entering the pleural space there was old serosanguinous fluid. The thoracentesis catheter was inserted and connected to a syringe/bag. 550 cc of serosanguinous fluid was removed without difficulty but that was all that could be removed. I moved the catheter in and out to try to get more fluid but no further fluid was found. The catheter was removed and a bandaid applied. CXR was ordered. He tolerated the procedure well.

## 2016-04-09 NOTE — Progress Notes (Signed)
Rehab admissions - I received a call from PT saying patient making good progress and unlikely to need acute inpatient rehab stay.  Now recommending home with [facility] therapies.  Call me for questions.  [phone]

## 2016-04-09 NOTE — Progress Notes (Signed)
9 Days Post-Op Procedure(s) (LRB): REPLACEMENT OF ASCENDING AORTA USING A 21mm HEMASHIELD PLATINUM VASCULAR GRAFT (N/A) Subjective:  No complaints.  Ambulated 150 feet this am.  Objective: Vital signs in last 24 hours: Temp:  [97.9 F (36.6 C)-99.1 F (37.3 C)] 98.8 F (37.1 C) (07/17 0735) Pulse Rate:  [67-90] 71 (07/17 0700) Cardiac Rhythm:  [-] Normal sinus rhythm (07/17 0700) Resp:  [11-22] 22 (07/17 0700) BP: (112-161)/(50-96) 141/67 mmHg (07/17 0600) SpO2:  [89 %-100 %] 95 % (07/17 0700) Weight:  [130.3 kg (287 lb 4.2 oz)] 130.3 kg (287 lb 4.2 oz) (07/17 0600)  Hemodynamic parameters for last 24 hours:    Intake/Output from previous day: 07/16 0701 - 07/17 0700 In: 1200 [P.O.:940; I.V.:10; IV Piggyback:250] Out: 630 [Urine:630] Intake/Output this shift:    General appearance: alert and cooperative Neurologic: intact Heart: regular rate and rhythm, S1, S2 normal, no murmur, click, rub or gallop Lungs: diminished breath sounds LLL, tubular 3/4 way up. Extremities: edema mild Wound: incisions ok  Lab Results:  Recent Labs  04/08/16 0426 04/09/16 0358  WBC 8.8 8.5  HGB 9.0* 9.4*  HCT 27.0* 28.7*  PLT 271 375   BMET:  Recent Labs  04/08/16 0426 04/09/16 0358  NA 136 135  K 2.9* 3.2*  CL 93* 94*  CO2 35* 34*  GLUCOSE 102* 107*  BUN 16 19  CREATININE 1.09 0.97  CALCIUM 8.5* 8.5*    PT/INR: No results for input(s): LABPROT, INR in the last 72 hours. ABG    Component Value Date/Time   PHART 7.470* 04/07/2016 0315   HCO3 38.4* 04/07/2016 0315   TCO2 40.0 04/07/2016 0315   ACIDBASEDEF 2.0 04/01/2016 1205   O2SAT 59.1 04/07/2016 0350   CBG (last 3)   Recent Labs  04/08/16 1727 04/08/16 2141 04/09/16 0733  GLUCAP 112* 115* 112*    CLINICAL DATA: Surgery for thoracic aortic dissection and rupture abdominal aortic aneurysm on March 28, 2016  EXAM: CHEST 2 VIEW  COMPARISON: Portable chest x-ray of April 08, 2016  FINDINGS: The right  lung is well-expanded and clear. A tiny posterior layering pleural effusion is demonstrated on the lateral view. On the left there is a pleural effusion occupying approximately 1/2 of the pleural space volume. There is left lower lobe atelectasis or pneumonia. The mediastinum remains widened but is better defined today The cardiac silhouette is enlarged. The pulmonary vascularity is normal. There is calcification in the wall of the aortic arch. The sternal wires are intact. There are surgical skin staples over the right upper hemi thorax.  IMPRESSION: Moderate-sized left pleural effusion with trace right pleural effusion. Cardiomegaly without pulmonary edema. Postsurgical changes with better definition of the widened mediastinum today.   Electronically Signed  By: David Martinique M.D.  On: 04/09/2016 07:26     Assessment/Plan:  S/P Procedure(s) (LRB): REPLACEMENT OF ASCENDING AORTA USING A 13mm HEMASHIELD PLATINUM VASCULAR GRAFT   He continues to do well POD 9 CXR shows a moderate to large left pleural effusion with LLL atelectasis so I think a thoracentesis is indicated to improved breathing and decrease oxygen requirement since he is still on 5L. I discussed the procedure with him including alternatives, benefits and risks including but not limited to bleeding, pneumothorax, injury to lung and recurrent effusion. He says that he he understands and agrees to proceed.  Expected postop volume excess: He is about back to his preop wt of 285.  Hypokalemia due to diuresis: replete  Deconditioning: continue ambulation. Rehab evaluation  He can go to 2W after thoracentesis.  LOS: 9 days    [name] 04/09/2016

## 2016-04-09 NOTE — Consult Note (Signed)
Physical Medicine and Rehabilitation Consult  Reason for Consult: Debility s/p type A ascending aortic dissection Referring Physician: Dr. Roxy Manns   HPI: Jon Rodriguez is a 55 y.o. male with history of HTN, obesity who was admitted on 03/31/16 after developing sharp chest pain with mild nausea while working out at the gym. CTA chest showed dilated ascending aorta with suspicion of intimal flap/hematoma close to coronary ostia and cardiac enzymes elevated with ST changes in inferior leads.  Dr. Prescott Gum consulted and patient underwent emergent repair of type A ascending aortic dissection with placement of IAPB for RV dysfunction. Foley placement by Dr Jeffie Pollock prior to surgery. Post op with high oxygen needs requiring intubation  as well as sedation due to agitation. He developed fever 07/11 and was started on ceftazidine. He tolerated extubation on 07/14 and was started on dysphagia 3, thin liquids with strict aspiration precautions. Continues to require  6L oxygen per [facility] and CXR today shows moderate left pleural effusion. Plans for thoracocentesis today?  PT evaluation done this weekend showing debility and CIR recommended for follow up therapy.   Was working full time PTA. Has started working out in the past 5 months and lost 30 lbs--goes to the gym 5 days/week and doing some hiking. Has been sitting up in the chair this weekend.    Review of Systems  HENT: Negative for hearing loss.   Eyes: Negative for blurred vision and double vision.  Respiratory: Positive for shortness of breath. Negative for cough.   Cardiovascular: Negative for chest pain and palpitations.  Gastrointestinal: Positive for heartburn. Negative for constipation.  Genitourinary: Negative for dysuria and urgency.  Musculoskeletal: Negative for myalgias, back pain, joint pain and neck pain.  Skin: Negative for itching and rash.  Neurological: Positive for weakness. Negative for dizziness, tingling and headaches.    Psychiatric/Behavioral: The patient is not nervous/anxious and does not have insomnia.   All other systems reviewed and are negative.     Past Medical History  Diagnosis Date  . Hypertension   . High cholesterol     Past Surgical History  Procedure Laterality Date  . Replacement ascending aorta N/A 03/31/2016    Procedure: REPLACEMENT OF ASCENDING AORTA USING A 74mm HEMASHIELD PLATINUM VASCULAR GRAFT;  Surgeon: Ivin Poot, MD;  Location: [place];  Service: Open Heart Surgery;  Laterality: N/A;    Family History  Problem Relation Age of Onset  . Hypertension      SIblings  . CAD Mother     CABG    Social History:  Lives with a roommate. 78+ year old son ?available. He reports that he has quit smoking 08/2015. He does not have any smokeless tobacco history on file. He reports that he drinks beer 3 X week. His drug history is not on file.    Allergies: No Known Allergies    Medications Prior to Admission  Medication Sig Dispense Refill  . aspirin 325 MG EC tablet Take 325 mg by mouth daily.    . Fish Oil-Cholecalciferol (FISH OIL + D3 PO) Take 2 capsules by mouth daily.    Marland Kitchen MAGNESIUM-POTASSIUM PO Take 1 tablet by mouth daily.    . Multiple Vitamin (MULTIVITAMIN) capsule Take 1 capsule by mouth daily.    . simvastatin (ZOCOR) 20 MG tablet Take 1 tablet by mouth daily.      Home: Home Living Family/patient expects to be discharged to:: Private residence Living Arrangements: Children, Non-relatives/Friends Available Help at Discharge: Family,  Friend(s), Available PRN/intermittently (Per patient, son and roommate work) Type of Home: House Home Access: Stairs to enter Technical brewer of Steps: 2 Entrance Stairs-Rails: Right Home Layout: One level Home Equipment: None ("walking stick")  Functional History: Prior Function Level of Independence: Independent Functional Status:  Mobility: Bed Mobility Overal bed mobility: Needs Assistance, +2 for physical  assistance Bed Mobility: Sit to Supine Sit to supine: Max assist, +2 for physical assistance General bed mobility comments: Verbal cues for technique.  Assist to lower trunk and to bring BLE's onto bed. Transfers Overall transfer level: Needs assistance Equipment used: 2 person hand held assist Transfers: Sit to/from Stand, Stand Pivot Transfers Sit to Stand: Max assist, +2 physical assistance Stand pivot transfers: Mod assist, +2 physical assistance General transfer comment: Verbal cues to scoot to edge of chair.  Cues for hand placement.  Max assist to power up to stance.  Patient able to take several shuffle steps to pivot to bed.  Patient's knees buckling due to weakness - blocked knees and assisted patient onto bed.  After rest, patient stood and took several steps to left side for positioning to return to supine. Ambulation/Gait General Gait Details: Unable    ADL:    Cognition: Cognition Overall Cognitive Status: Within Functional Limits for tasks assessed Orientation Level: Oriented X4 Cognition Arousal/Alertness: Awake/alert Behavior During Therapy: WFL for tasks assessed/performed Overall Cognitive Status: Within Functional Limits for tasks assessed  Blood pressure 141/67, pulse 71, temperature 98.8 F (37.1 C), temperature source Oral, resp. rate 22, height 6' (1.829 m), weight 130.3 kg (287 lb 4.2 oz), SpO2 95 %. Physical Exam  Nursing note and vitals reviewed. Constitutional: He is oriented to person, place, and time. He appears well-developed and well-nourished. No distress.  HENT:  Head: Normocephalic and atraumatic.  Mouth/Throat: Oropharynx is clear and moist.  Eyes: Conjunctivae and EOM are normal. Pupils are equal, round, and reactive to light.  Neck: Normal range of motion. Neck supple.  Cardiovascular: Normal rate and regular rhythm.   No murmur heard. Respiratory: Effort normal. No respiratory distress. He has decreased breath sounds in the left lower  field. He has no wheezes. He exhibits tenderness.  Pacer wires in place.  Mid line sternal incision clean, dry and intact.  +Alice  GI: Soft. Bowel sounds are normal. He exhibits no distension. There is no tenderness.  Musculoskeletal: He exhibits edema (Min edema RLE). He exhibits no tenderness.  Neurological: He is alert and oriented to person, place, and time.  Speech clear.  Able to follow basic commands without difficulty.  Motor: 4+-5/5 throughout Sensation intact to light touch  Skin: Skin is warm and dry. He is not diaphoretic.  Psychiatric: He has a normal mood and affect. His behavior is normal. Judgment and thought content normal.    Results for orders placed or performed during the hospital encounter of 03/31/16 (from the past 24 hour(s))  Glucose, capillary     Status: Abnormal   Collection Time: 04/08/16 12:53 PM  Result Value Ref Range   Glucose-Capillary 105 (H) 65 - 99 mg/dL   Comment 1 Capillary Specimen    Comment 2 Notify RN   Glucose, capillary     Status: Abnormal   Collection Time: 04/08/16  5:27 PM  Result Value Ref Range   Glucose-Capillary 112 (H) 65 - 99 mg/dL   Comment 1 Capillary Specimen    Comment 2 Notify RN   Glucose, capillary     Status: Abnormal   Collection Time: 04/08/16  9:41 PM  Result Value Ref Range   Glucose-Capillary 115 (H) 65 - 99 mg/dL   Comment 1 Capillary Specimen    Comment 2 Notify RN    Comment 3 Document in Chart   CBC     Status: Abnormal   Collection Time: 04/09/16  3:58 AM  Result Value Ref Range   WBC 8.5 4.0 - 10.5 K/uL   RBC 3.04 (L) 4.22 - 5.81 MIL/uL   Hemoglobin 9.4 (L) 13.0 - 17.0 g/dL   HCT 28.7 (L) 39.0 - 52.0 %   MCV 94.4 78.0 - 100.0 fL   MCH 30.9 26.0 - 34.0 pg   MCHC 32.8 30.0 - 36.0 g/dL   RDW 13.5 11.5 - 15.5 %   Platelets 375 150 - 400 K/uL  Basic metabolic panel     Status: Abnormal   Collection Time: 04/09/16  3:58 AM  Result Value Ref Range   Sodium 135 135 - 145 mmol/L   Potassium 3.2 (L) 3.5 -  5.1 mmol/L   Chloride 94 (L) 101 - 111 mmol/L   CO2 34 (H) 22 - 32 mmol/L   Glucose, Bld 107 (H) 65 - 99 mg/dL   BUN 19 6 - 20 mg/dL   Creatinine, Ser 0.97 0.61 - 1.24 mg/dL   Calcium 8.5 (L) 8.9 - 10.3 mg/dL   GFR calc non Af Amer >60 >60 mL/min   GFR calc Af Amer >60 >60 mL/min   Anion gap 7 5 - 15  Glucose, capillary     Status: Abnormal   Collection Time: 04/09/16  7:33 AM  Result Value Ref Range   Glucose-Capillary 112 (H) 65 - 99 mg/dL   Comment 1 Capillary Specimen    Comment 2 Notify RN    Dg Chest 2 View  04/09/2016  CLINICAL DATA:  Surgery for thoracic aortic dissection and rupture abdominal aortic aneurysm on March 28, 2016 EXAM: CHEST  2 VIEW COMPARISON:  Portable chest x-ray of April 08, 2016 FINDINGS: The right lung is well-expanded and clear. A tiny posterior layering pleural effusion is demonstrated on the lateral view. On the left there is a pleural effusion occupying approximately 1/2 of the pleural space volume. There is left lower lobe atelectasis or pneumonia. The mediastinum remains widened but is better defined today The cardiac silhouette is enlarged. The pulmonary vascularity is normal. There is calcification in the wall of the aortic arch. The sternal wires are intact. There are surgical skin staples over the right upper hemi thorax. IMPRESSION: Moderate-sized left pleural effusion with trace right pleural effusion. Cardiomegaly without pulmonary edema. Postsurgical changes with better definition of the widened mediastinum today. Electronically Signed   By: David  Martinique M.D.   On: 04/09/2016 07:26   Dg Chest Port 1 View  04/08/2016  CLINICAL DATA:  Atelectasis. EXAM: PORTABLE CHEST 1 VIEW COMPARISON:  Radiograph of April 07, 2016. FINDINGS: Stable cardiomegaly. Sternotomy wires are noted. No pneumothorax is noted. Feeding tube has been removed. Stable large left basilar opacity is noted concerning for infiltrate or atelectasis with associated pleural effusion. Minimal  right basilar subsegmental atelectasis is noted. Bony thorax is unremarkable. IMPRESSION: Feeding tube has been removed. Stable left basilar opacity as described above. Electronically Signed   By: Marijo Conception, M.D.   On: 04/08/2016 08:01    Assessment/Plan: Diagnosis: Debility Labs and images independently reviewed.  Records reviewed and summated above.  1. Does the need for close, 24 hr/day medical supervision in concert with the patient's rehab  needs make it unreasonable for this patient to be served in a less intensive setting? Yes  Co-Morbidities requiring supervision/potential complications:  HTN (monitor and provide prns in accordance with increased physical exertion and pain), obesity (Body mass index is 38.95 kg/(m^2)., diet and exercise education, encourage weight loss to increase endurance and promote overall health), fever (cont to monitor for signs and symptoms of infection, further workup if indicated), dysphagia (Cont SLP, advance as tolerated), tachypnea (monitor RR and O2 Sats with increased physical exertion), hypokalemia (continue to monitor and replete as necessary), ABLA (transfuse if necessary to ensure appropriate perfusion for increased activity tolerance) 2. Due to safety, skin/wound care, disease management, pain management and patient education, does the patient require 24 hr/day rehab nursing? Yes 3. Does the patient require coordinated care of a physician, rehab nurse, PT (1-2 hrs/day, 5 days/week), OT (1-2 hrs/day, 5 days/week) and SLP (1-2 hrs/day, 5 days/week) to address physical and functional deficits in the context of the above medical diagnosis(es)? Yes Addressing deficits in the following areas: balance, endurance, locomotion, transferring, bathing, dressing, feeding, toileting, swallowing and psychosocial support 4. Can the patient actively participate in an intensive therapy program of at least 3 hrs of therapy per day at least 5 days per week? Yes 5. The  potential for patient to make measurable gains while on inpatient rehab is excellent 6. Anticipated functional outcomes upon discharge from inpatient rehab are supervision and min assist  with PT, supervision and min assist with OT, independent and modified independent with SLP. 7. Estimated rehab length of stay to reach the above functional goals is: 14-17 days. 8. Does the patient have adequate social supports and living environment to accommodate these discharge functional goals? Potentially 9. Anticipated D/C setting: Home 10. Anticipated post D/C treatments: HH therapy and Home excercise program 11. Overall Rehab/Functional Prognosis: good  RECOMMENDATIONS: This patient's condition is appropriate for continued rehabilitative care in the following setting: At this point, recommend CIR if pt has caregiver support after completion of medical workup/medically stable.  However, would apprecaite reevaluation by therapies after ?thorocentisis as there is a good possibility pt has made functional gains since evaluation.    Patient has agreed to participate in recommended program. Yes Note that insurance prior authorization may be required for reimbursement for recommended care.  Comment: Rehab Admissions Coordinator to follow up.  Delice Lesch, MD 04/09/2016

## 2016-04-09 NOTE — Progress Notes (Signed)
Physical Therapy Treatment Patient Details Name: Jon Rodriguez MRN: [id number] DOB: 1960/12/26 Today's Date: 04/09/2016    History of Present Illness Patient is a 55 yo male admitted 03/31/16 with chest pain.  Patient with ascending aortic dissection.  Emergent surgery for replacement of ascending aorta.  Patient extubated 04/06/16.    PT Comments    Pt with huge improvement in mobility since evaluation. Do not feel pt will need rehab and that he will be able to return home.  Follow Up Recommendations  Supervision - Intermittent;Home health PT (May not need HHPT)     Equipment Recommendations  Other (comment) Agricultural consultant)    Recommendations for Other Services       Precautions / Restrictions Precautions Precautions: Sternal;Fall Restrictions Weight Bearing Restrictions: Yes Other Position/Activity Restrictions: Sternal precautions    Mobility  Bed Mobility Overal bed mobility: Needs Assistance;+2 for physical assistance Bed Mobility: Sidelying to Sit;Sit to Sidelying   Sidelying to sit: Min guard     Sit to sidelying: Min guard General bed mobility comments: Good technique  Transfers Overall transfer level: Needs assistance Equipment used: Rolling walker (2 wheeled) Transfers: Sit to/from Stand Sit to Stand: Min guard         General transfer comment: Assist for safety. Good adherence to sternal precautions.  Ambulation/Gait Ambulation/Gait assistance: Min guard Ambulation Distance (Feet): 310 Feet Assistive device: Rolling walker (2 wheeled) Gait Pattern/deviations: Step-through pattern;Decreased stride length Gait velocity: decr Gait velocity interpretation: Below normal speed for age/gender General Gait Details: Pt took 1 sitting rest break. Amb on 6L of O2   Stairs            Wheelchair Mobility    Modified Rankin (Stroke Patients Only)       Balance Overall balance assessment: Needs assistance Sitting-balance support: No upper extremity  supported Sitting balance-Leahy Scale: Good     Standing balance support: No upper extremity supported Standing balance-Leahy Scale: Fair                      Cognition Arousal/Alertness: Awake/alert Behavior During Therapy: WFL for tasks assessed/performed Overall Cognitive Status: Within Functional Limits for tasks assessed                      Exercises      General Comments        Pertinent Vitals/Pain Pain Assessment: No/denies pain    Home Living                      Prior Function            PT Goals (current goals can now be found in the care plan section) Acute Rehab PT Goals PT Goal Formulation: With patient Time For Goal Achievement: 04/16/16 Potential to Achieve Goals: Good Progress towards PT goals: Goals met and updated - see care plan    Frequency  Min 3X/week    PT Plan Discharge plan needs to be updated    Co-evaluation             End of Session Equipment Utilized During Treatment: Oxygen Activity Tolerance: Patient tolerated treatment well Patient left: in bed;with call bell/phone within reach     Time: 1440-1500 PT Time Calculation (min) (ACUTE ONLY): 20 min  Charges:  $Gait Training: 8-22 mins                    G Codes:      Jon Rodriguez  04/09/2016, 3:49 PM [facility] PT [phone]

## 2016-04-09 NOTE — Progress Notes (Signed)
Patient ID: Jon Rodriguez, male   DOB: [birth year], 55 y.o.   MRN: [id number]  SICU Evening Rounds  Hemodynamically stable today  Walked 300 ft twice.  Urine output ok

## 2016-04-10 ENCOUNTER — Inpatient Hospital Stay (HOSPITAL_COMMUNITY): Payer: 59

## 2016-04-10 LAB — CBC
HEMATOCRIT: 27 % — AB (ref 39.0–52.0)
HEMOGLOBIN: 8.9 g/dL — AB (ref 13.0–17.0)
MCH: 31.2 pg (ref 26.0–34.0)
MCHC: 33 g/dL (ref 30.0–36.0)
MCV: 94.7 fL (ref 78.0–100.0)
Platelets: 414 10*3/uL — ABNORMAL HIGH (ref 150–400)
RBC: 2.85 MIL/uL — AB (ref 4.22–5.81)
RDW: 13.3 % (ref 11.5–15.5)
WBC: 8.4 10*3/uL (ref 4.0–10.5)

## 2016-04-10 LAB — BASIC METABOLIC PANEL
ANION GAP: 8 (ref 5–15)
BUN: 16 mg/dL (ref 6–20)
CO2: 31 mmol/L (ref 22–32)
Calcium: 8.5 mg/dL — ABNORMAL LOW (ref 8.9–10.3)
Chloride: 97 mmol/L — ABNORMAL LOW (ref 101–111)
Creatinine, Ser: 1.05 mg/dL (ref 0.61–1.24)
GLUCOSE: 110 mg/dL — AB (ref 65–99)
POTASSIUM: 3.4 mmol/L — AB (ref 3.5–5.1)
Sodium: 136 mmol/L (ref 135–145)

## 2016-04-10 MED ORDER — LISINOPRIL 10 MG PO TABS
10.0000 mg | ORAL_TABLET | Freq: Every day | ORAL | Status: DC
  Administered 2016-04-10: 10 mg via ORAL
  Filled 2016-04-10: qty 1

## 2016-04-10 MED ORDER — HYDRALAZINE HCL 20 MG/ML IJ SOLN
10.0000 mg | Freq: Once | INTRAMUSCULAR | Status: AC
  Administered 2016-04-10: 10 mg via INTRAVENOUS
  Filled 2016-04-10: qty 1

## 2016-04-10 MED ORDER — PRO-STAT SUGAR FREE PO LIQD
30.0000 mL | Freq: Two times a day (BID) | ORAL | Status: DC
  Administered 2016-04-10: 30 mL via ORAL
  Filled 2016-04-10 (×6): qty 30

## 2016-04-10 MED ORDER — ZOLPIDEM TARTRATE 5 MG PO TABS
10.0000 mg | ORAL_TABLET | Freq: Every evening | ORAL | Status: DC | PRN
  Administered 2016-04-10: 10 mg via ORAL
  Filled 2016-04-10: qty 2

## 2016-04-10 MED ORDER — POTASSIUM CHLORIDE CRYS ER 20 MEQ PO TBCR
20.0000 meq | EXTENDED_RELEASE_TABLET | ORAL | Status: DC | PRN
Start: 2016-04-10 — End: 2016-04-12
  Filled 2016-04-10: qty 1

## 2016-04-10 MED ORDER — POTASSIUM CHLORIDE CRYS ER 20 MEQ PO TBCR
40.0000 meq | EXTENDED_RELEASE_TABLET | Freq: Once | ORAL | Status: AC
  Administered 2016-04-10: 40 meq via ORAL

## 2016-04-10 MED ORDER — HYDRALAZINE HCL 20 MG/ML IJ SOLN
10.0000 mg | INTRAMUSCULAR | Status: DC | PRN
  Administered 2016-04-11 (×2): 10 mg via INTRAVENOUS
  Filled 2016-04-10 (×2): qty 1

## 2016-04-10 MED ORDER — ENSURE ENLIVE PO LIQD
237.0000 mL | Freq: Two times a day (BID) | ORAL | Status: DC
  Administered 2016-04-10 – 2016-04-14 (×8): 237 mL via ORAL

## 2016-04-10 NOTE — Progress Notes (Signed)
Nutrition Follow Up  DOCUMENTATION CODES:   Obesity unspecified  INTERVENTION:   Ensure Enlive po BID, each supplement provides 350 kcal and 20 grams of protein  Prostat liquid protein po 30 ml BID with meals, each supplement provides 100 kcal, 15 grams protein  NUTRITION DIAGNOSIS:   Inadequate oral intake now related to  (decreased appetite) as evidenced by meal completion < 50%, ongoing  GOAL:   Patient will meet greater than or equal to 90% of their needs, currently unmet  MONITOR:   PO intake, Supplement acceptance, Labs, Weight trends, I & O's  ASSESSMENT:   55 yo Male with morbid obesity, hypertension, and an active smoking history who suffered an acute type A ascending aortic dissection.  Patient s/p procedure 7/9: REPLACEMENT OF ASCENDING AORTA  ANTEGRADE CEREBRAL PERFUSION HYPOTHERMIC CIRCULATORY ARREST  Patient extubated this 7/14. S/p swallow evaluation 7/15 >> SLP recommending Dys 3, thin then Regular diet. S/p L thoracentesis 7/17 for large pleural effusion with LLL atelectasis. PO intake variable at 10-50% per flowsheet records. Would benefit from oral nutrition supplement and protein modular >> will order.  Diet Order:  Diet regular Room service appropriate?: Yes; Fluid consistency:: Thin  Skin:  Reviewed, no issues  Last BM:  7/18  Height:   Ht Readings from Last 1 Encounters:  04/02/16 6' (1.829 m)    Weight:   Wt Readings from Last 1 Encounters:  04/10/16 285 lb 15 oz (129.7 kg)    Ideal Body Weight:  81 kg  BMI:  Body mass index is 38.77 kg/(m^2).  Estimated Nutritional Needs:   Kcal:  2400-2600  Protein:  130-140 gm  Fluid:  2.4-2.6 L  EDUCATION NEEDS:   No education needs identified at this time  [name], RD, LDN Pager #: [phone] After-Hours Pager #: [phone]

## 2016-04-10 NOTE — Progress Notes (Signed)
[facility] BRIEF SICU PROGRESS NOTE  10 Days Post-Op  S/P Procedure(s) (LRB): REPLACEMENT OF ASCENDING AORTA USING A 83mm HEMASHIELD PLATINUM VASCULAR GRAFT (N/A)   Stable day NSR w/ stable BP O2 sats 92-100% on 6 L/min CT scan reveals small residual left pleural effusion, LLL atelectasis, small right pleural effusion and small pericardial effusion  Plan: Continue current plan  Rexene Alberts, MD 04/10/2016 7:15 PM

## 2016-04-10 NOTE — Progress Notes (Signed)
Physical Therapy Treatment Patient Details Name: Jon Rodriguez MRN: [id number] DOB: [birth year] Today's Date: 04/11/16    History of Present Illness Patient is a 55 yo male admitted 03/31/16 with chest pain.  Patient with ascending aortic dissection.  Emergent surgery for replacement of ascending aorta.  Patient extubated 04/06/16.    PT Comments    Pt continues to do well with mobility. Dyspnea 2/4 with amb on O2.   Follow Up Recommendations  Supervision - Intermittent;Home health PT (May not need HHPT)     Equipment Recommendations  Other (comment) Agricultural consultant)    Recommendations for Other Services       Precautions / Restrictions Precautions Precautions: Sternal;Fall Restrictions Weight Bearing Restrictions: Yes Other Position/Activity Restrictions: Sternal precautions    Mobility  Bed Mobility                  Transfers Overall transfer level: Needs assistance Equipment used: Rolling walker (2 wheeled) Transfers: Sit to/from Stand Sit to Stand: Supervision         General transfer comment: Assist for safety. Good adherence to sternal precautions.  Ambulation/Gait Ambulation/Gait assistance: Supervision Ambulation Distance (Feet): 310 Feet Assistive device: Rolling walker (2 wheeled) Gait Pattern/deviations: Step-through pattern;Decreased stride length Gait velocity: decr Gait velocity interpretation: Below normal speed for age/gender General Gait Details: Pt took 1 sitting rest break. Amb on 6L of O2. SpO2 >95%   Stairs            Wheelchair Mobility    Modified Rankin (Stroke Patients Only)       Balance   Sitting-balance support: No upper extremity supported Sitting balance-Leahy Scale: Good     Standing balance support: No upper extremity supported Standing balance-Leahy Scale: Fair                      Cognition Arousal/Alertness: Awake/alert Behavior During Therapy: WFL for tasks assessed/performed Overall  Cognitive Status: Within Functional Limits for tasks assessed                      Exercises      General Comments        Pertinent Vitals/Pain      Home Living                      Prior Function            PT Goals (current goals can now be found in the care plan section) Progress towards PT goals: Progressing toward goals    Frequency  Min 3X/week    PT Plan Current plan remains appropriate    Co-evaluation             End of Session Equipment Utilized During Treatment: Oxygen Activity Tolerance: Patient tolerated treatment well Patient left: with call bell/phone within reach;in chair     Time: [id number] PT Time Calculation (min) (ACUTE ONLY): 22 min  Charges:  $Gait Training: 8-22 mins                    G Codes:      Jon Rodriguez 04/11/2016, 3:14 PM [employer] PT [phone]

## 2016-04-10 NOTE — Progress Notes (Signed)
10 Days Post-Op Procedure(s) (LRB): REPLACEMENT OF ASCENDING AORTA USING A 21mm HEMASHIELD PLATINUM VASCULAR GRAFT (N/A) Subjective:  Not sleeping well. Ambulated around ICU this am.  Objective: Vital signs in last 24 hours: Temp:  [98.2 F (36.8 C)-99.2 F (37.3 C)] 98.8 F (37.1 C) (07/18 0400) Pulse Rate:  [65-95] 65 (07/18 0800) Cardiac Rhythm:  [-] Normal sinus rhythm (07/18 0700) Resp:  [13-31] 23 (07/18 0800) BP: (113-181)/(45-97) 159/67 mmHg (07/18 0800) SpO2:  [89 %-99 %] 96 % (07/18 0800) Weight:  [129.7 kg (285 lb 15 oz)] 129.7 kg (285 lb 15 oz) (07/18 0500)  Hemodynamic parameters for last 24 hours:    Intake/Output from previous day: 07/17 0701 - 07/18 0700 In: 1080 [P.O.:1080] Out: 450 [Urine:450] Intake/Output this shift:    General appearance: alert and cooperative Neurologic: intact Heart: regular rate and rhythm, S1, S2 normal, no murmur, click, rub or gallop Lungs: diminished and tubular over LLL Extremities: edema mild in ankles Wound: incisions ok  Lab Results:  Recent Labs  04/09/16 0358 04/10/16 0327  WBC 8.5 8.4  HGB 9.4* 8.9*  HCT 28.7* 27.0*  PLT 375 414*   BMET:  Recent Labs  04/09/16 0358 04/10/16 0327  NA 135 136  K 3.2* 3.4*  CL 94* 97*  CO2 34* 31  GLUCOSE 107* 110*  BUN 19 16  CREATININE 0.97 1.05  CALCIUM 8.5* 8.5*    PT/INR: No results for input(s): LABPROT, INR in the last 72 hours. ABG    Component Value Date/Time   PHART 7.470* 04/07/2016 0315   HCO3 38.4* 04/07/2016 0315   TCO2 40.0 04/07/2016 0315   ACIDBASEDEF 2.0 04/01/2016 1205   O2SAT 59.1 04/07/2016 0350   CBG (last 3)   Recent Labs  04/09/16 1209 04/09/16 1705 04/09/16 2150  GLUCAP 114* 125* 110*    Assessment/Plan: S/P Procedure(s) (LRB): REPLACEMENT OF ASCENDING AORTA USING A 76mm HEMASHIELD PLATINUM VASCULAR GRAFT (N/A)  He continues to do well POD 10  Hypertension: continue lopressor and add ACE I.  CXR shows persistent density at  left base and breath sounds are tubular suggesting effusion. I could only remove 550 cc yesterday. I will check PA/lat and decub films tomorrow to reassess.   Expected postop volume excess: He is about back to his preop wt of 285.  Hypokalemia due to diuresis: replete  Deconditioning: continue ambulation. Rehab evaluation  He can go to 2W    LOS: 10 days    [name] 04/10/2016

## 2016-04-10 NOTE — Progress Notes (Signed)
Dr Cyndia Bent notified about uncontrolled HTN (181/71) with no change after 10mg  labetalol given. Orders received for Hydralazine 10mg .

## 2016-04-11 MED ORDER — LISINOPRIL 10 MG PO TABS
10.0000 mg | ORAL_TABLET | Freq: Two times a day (BID) | ORAL | Status: DC
  Administered 2016-04-11 – 2016-04-12 (×3): 10 mg via ORAL
  Filled 2016-04-11 (×3): qty 1

## 2016-04-11 MED ORDER — LISINOPRIL 20 MG PO TABS: 20.0000 mg | ORAL_TABLET | Freq: Every day | ORAL | Status: DC

## 2016-04-11 MED ORDER — FUROSEMIDE 40 MG PO TABS
40.0000 mg | ORAL_TABLET | Freq: Every day | ORAL | Status: DC
  Administered 2016-04-12 – 2016-04-16 (×5): 40 mg via ORAL
  Filled 2016-04-11 (×5): qty 1

## 2016-04-11 MED ORDER — POTASSIUM CHLORIDE CRYS ER 20 MEQ PO TBCR
20.0000 meq | EXTENDED_RELEASE_TABLET | Freq: Every day | ORAL | Status: DC
  Administered 2016-04-11 – 2016-04-13 (×3): 20 meq via ORAL
  Filled 2016-04-11 (×3): qty 1

## 2016-04-11 MED ORDER — METOPROLOL TARTRATE 25 MG PO TABS
25.0000 mg | ORAL_TABLET | Freq: Two times a day (BID) | ORAL | Status: DC
  Administered 2016-04-11 – 2016-04-16 (×11): 25 mg via ORAL
  Filled 2016-04-11 (×11): qty 1

## 2016-04-11 MED ORDER — METOPROLOL TARTRATE 50 MG PO TABS: 50.0000 mg | ORAL_TABLET | Freq: Two times a day (BID) | ORAL | Status: DC

## 2016-04-11 NOTE — Progress Notes (Signed)
11 Days Post-Op Procedure(s) (LRB): REPLACEMENT OF ASCENDING AORTA USING A 54mm HEMASHIELD PLATINUM VASCULAR GRAFT (N/A) Subjective:  Walking in ICU with stable hemodynamics Requires 5-6 liters oxygen to maintain sats Continue IV Lasix 40 mg daily Chest CT scan post thoracentesis shows persistent left lower lobe atelectasis with minimal effusion. Small pericardial effusion. Patient ready to transfer to stepdown. We'll continue with diuretics and hope to wean oxygen off before discharge in about 3 days  Objective: Vital signs in last 24 hours: Temp:  [98 F (36.7 C)-98.8 F (37.1 C)] 98.7 F (37.1 C) (07/19 0700) Pulse Rate:  [60-92] 83 (07/19 0900) Cardiac Rhythm:  [-] Normal sinus rhythm (07/19 0900) Resp:  [16-30] 22 (07/19 0900) BP: (116-175)/(52-105) 139/60 mmHg (07/19 0800) SpO2:  [84 %-100 %] 96 % (07/19 0900) Weight:  [284 lb 9.8 oz (129.1 kg)] 284 lb 9.8 oz (129.1 kg) (07/19 0500)  Hemodynamic parameters for last 24 hours:  sinus rhythm  Intake/Output from previous day: 07/18 0701 - 07/19 0700 In: 10 [I.V.:10] Out: -  Intake/Output this shift:   Alert and comfortable Lungs clear Heart rate regular Soft systolic murmur Good peripheral pulses Surgical incisions clean and dry   Lab Results:  Recent Labs  04/09/16 0358 04/10/16 0327  WBC 8.5 8.4  HGB 9.4* 8.9*  HCT 28.7* 27.0*  PLT 375 414*   BMET:  Recent Labs  04/09/16 0358 04/10/16 0327  NA 135 136  K 3.2* 3.4*  CL 94* 97*  CO2 34* 31  GLUCOSE 107* 110*  BUN 19 16  CREATININE 0.97 1.05  CALCIUM 8.5* 8.5*    PT/INR: No results for input(s): LABPROT, INR in the last 72 hours. ABG    Component Value Date/Time   PHART 7.470* 04/07/2016 0315   HCO3 38.4* 04/07/2016 0315   TCO2 40.0 04/07/2016 0315   ACIDBASEDEF 2.0 04/01/2016 1205   O2SAT 59.1 04/07/2016 0350   CBG (last 3)   Recent Labs  04/09/16 1209 04/09/16 1705 04/09/16 2150  GLUCAP 114* 125* 110*    Assessment/Plan: S/P  Procedure(s) (LRB): REPLACEMENT OF ASCENDING AORTA USING A 64mm HEMASHIELD PLATINUM VASCULAR GRAFT (N/A) Mobilize Diuresis Plan for transfer to step-down: see transfer orders Wean oxygen as tolerated   LOS: 11 days    Tharon Aquas Trigt III 04/11/2016

## 2016-04-11 NOTE — Progress Notes (Signed)
Patient ambulated from 2S08 to 2W21. Patient tolerated walk well. Belongings at bedside. Receiving RN at bedside and patient placed on tele. No questions from patient or receiving RN.  Rowe Pavy, RN

## 2016-04-12 ENCOUNTER — Inpatient Hospital Stay (HOSPITAL_COMMUNITY): Payer: 59

## 2016-04-12 LAB — CBC
HCT: 27.5 % — ABNORMAL LOW (ref 39.0–52.0)
Hemoglobin: 9.1 g/dL — ABNORMAL LOW (ref 13.0–17.0)
MCH: 31.3 pg (ref 26.0–34.0)
MCHC: 33.1 g/dL (ref 30.0–36.0)
MCV: 94.5 fL (ref 78.0–100.0)
Platelets: 530 10*3/uL — ABNORMAL HIGH (ref 150–400)
RBC: 2.91 MIL/uL — ABNORMAL LOW (ref 4.22–5.81)
RDW: 14 % (ref 11.5–15.5)
WBC: 11.1 10*3/uL — ABNORMAL HIGH (ref 4.0–10.5)

## 2016-04-12 LAB — BASIC METABOLIC PANEL
Anion gap: 7 (ref 5–15)
BUN: 16 mg/dL (ref 6–20)
CO2: 29 mmol/L (ref 22–32)
Calcium: 8.6 mg/dL — ABNORMAL LOW (ref 8.9–10.3)
Chloride: 98 mmol/L — ABNORMAL LOW (ref 101–111)
Creatinine, Ser: 0.99 mg/dL (ref 0.61–1.24)
GFR calc Af Amer: >60 mL/min (ref >60)
GFR calc non Af Amer: >60 mL/min (ref >60)
Glucose, Bld: 104 mg/dL — ABNORMAL HIGH (ref 65–99)
Potassium: 4.7 mmol/L (ref 3.5–5.1)
Sodium: 134 mmol/L — ABNORMAL LOW (ref 135–145)

## 2016-04-12 MED ORDER — LISINOPRIL 10 MG PO TABS
20.0000 mg | ORAL_TABLET | Freq: Two times a day (BID) | ORAL | Status: DC
  Administered 2016-04-12 – 2016-04-16 (×8): 20 mg via ORAL
  Filled 2016-04-12 (×9): qty 2

## 2016-04-12 NOTE — Progress Notes (Addendum)
      [address]       [place] [address]             [phone]        12 Days Post-Op Procedure(s) (LRB): REPLACEMENT OF ASCENDING AORTA USING A 76mm HEMASHIELD PLATINUM VASCULAR GRAFT (N/A)  Subjective: Patient about to eat lunch. No specific complaints.  Objective: Vital signs in last 24 hours: Temp:  [97.4 F (36.3 C)-98.7 F (37.1 C)] 98.7 F (37.1 C) (07/20 0427) Pulse Rate:  [64-77] 74 (07/20 0427) Cardiac Rhythm:  [-] Normal sinus rhythm (07/20 0700) Resp:  [18-23] 20 (07/20 0427) BP: (127-163)/(50-103) 161/67 mmHg (07/20 0427) SpO2:  [95 %-100 %] 95 % (07/20 0427) Weight:  [285 lb 3.2 oz (129.366 kg)] 285 lb 3.2 oz (129.366 kg) (07/20 0439)  Pre op weight 129 kg Current Weight  04/12/16 285 lb 3.2 oz (129.366 kg)      Intake/Output from previous day: 07/19 0701 - 07/20 0700 In: 60 [I.V.:60] Out: -    Physical Exam:  Cardiovascular: RRR Pulmonary: Clear to auscultation on right and diminished left base Abdomen: Soft, non tender, bowel sounds present. Extremities: Mild bilateral lower extremity edema. Wounds: Clean and dry.  No erythema or signs of infection.  Lab Results: CBC: Recent Labs  04/10/16 0327 04/12/16 0440  WBC 8.4 11.1*  HGB 8.9* 9.1*  HCT 27.0* 27.5*  PLT 414* 530*   BMET:  Recent Labs  04/10/16 0327 04/12/16 0440  NA 136 134*  K 3.4* 4.7  CL 97* 98*  CO2 31 29  GLUCOSE 110* 104*  BUN 16 16  CREATININE 1.05 0.99  CALCIUM 8.5* 8.6*    PT/INR:  Lab Results  Component Value Date   INR 1.35 04/02/2016   INR 1.18 04/01/2016   INR 1.13 04/01/2016   ABG:  INR: Will add last result for INR, ABG once components are confirmed Will add last 4 CBG results once components are confirmed  Assessment/Plan:  1. CV - HR in the 60's. Hypertensive. On Lopressor 25 mg bid and Lisinopril 10 mg bid. Will increase Lisinopril for better BP control. 2.  Pulmonary - On 6 liters of oxygen via [place]. Attempt to wean  oxygen. CXR this am shows LLL atelectasis and effusion and right lung is clear. Has had previous thoracentesis. Encourage incentive spirometer and flutter valve. 3. Volume Overload - On Lasix 40 mg daily 4.  Acute blood loss anemia - H and H stable at 9.1 and 27.5 5. Mild hyponatremia-likely related to diuresis 6. At patient request, stop stool softeners 7. Hopefully, home this weekend.  ZIMMERMAN,DONIELLE MPA-C 04/12/2016,11:28 AM

## 2016-04-12 NOTE — Progress Notes (Signed)
Physical Therapy Treatment Patient Details Name: Berton Butrick MRN: [id number] DOB: September 17, 1961 Today's Date: 04/29/2016    History of Present Illness Patient is a 55 yo male admitted 03/31/16 with chest pain.  Patient with ascending aortic dissection.  Emergent surgery for replacement of ascending aorta.  Patient extubated 04/06/16.    PT Comments    Pt mobilizing very well and is independent with all mobility. No further PT needed. Pt remains O2 dependent. Continued to be followed by cardiac rehab.  Follow Up Recommendations  No PT follow up     Equipment Recommendations       Recommendations for Other Services       Precautions / Restrictions Precautions Precautions: Sternal Restrictions Weight Bearing Restrictions: Yes Other Position/Activity Restrictions: Sternal precautions    Mobility  Bed Mobility Overal bed mobility: Independent                Transfers Overall transfer level: Independent Equipment used: None Transfers: Sit to/from Stand Sit to Stand: Independent            Ambulation/Gait Ambulation/Gait assistance: Independent Ambulation Distance (Feet): 450 Feet Assistive device: None Gait Pattern/deviations: WFL(Within Functional Limits)   Gait velocity interpretation: at or above normal speed for age/gender General Gait Details: Pt took 2 standing rest breaks. Amb on 6L of O2. SpO2 96%.   Stairs            Wheelchair Mobility    Modified Rankin (Stroke Patients Only)       Balance Overall balance assessment: Independent                                  Cognition Arousal/Alertness: Awake/alert Behavior During Therapy: WFL for tasks assessed/performed Overall Cognitive Status: Within Functional Limits for tasks assessed                      Exercises      General Comments General comments (skin integrity, edema, etc.): Instructed pt in mini squats and heel raises at sink for strenghtening. Pt  verbalized understanding.      Pertinent Vitals/Pain      Home Living                      Prior Function            PT Goals (current goals can now be found in the care plan section) Progress towards PT goals: Goals met/education completed, patient discharged from PT    Frequency       PT Plan Discharge plan needs to be updated    Co-evaluation             End of Session Equipment Utilized During Treatment: Oxygen Activity Tolerance: Patient tolerated treatment well Patient left: with call bell/phone within reach;in chair;with family/visitor present     Time: 1694-5038 PT Time Calculation (min) (ACUTE ONLY): 20 min  Charges:  $Gait Training: 8-22 mins                    G Codes:      Rucha Wissinger 04/29/2016, 4:18 PM [employer] PT [phone]

## 2016-04-12 NOTE — Progress Notes (Signed)
Patient walked around circle and down one hall for a total of 350 ft. Patient did get winded and heart rate increased to 100. Heart rate went down to 75 when patient returned to room and sat down for a few minutes. Will continue to monitor.

## 2016-04-12 NOTE — Progress Notes (Signed)
CARDIAC REHAB PHASE I   PRE:  Rate/Rhythm: 73 SR  BP:  Supine: 141/60  Sitting:   Standing:    SaO2: 95% 6L  MODE:  Ambulation: 350 ft   POST:  Rate/Rhythm: 96 SR  BP:  Supine:   Sitting: 129/54  Standing:    SaO2: 95%6L 1024-1055 Pt motivated to walk and use IS. Can get up to 1500 ml on IS and can use correctly. Pt walked 350 ft on 6L holding to oxygen tank and did not want walker. To recliner after walk. Will try to decrease oxygen next visit.   Graylon Good, RN BSN  04/12/2016 10:51 AM

## 2016-04-12 NOTE — Progress Notes (Signed)
Physical Therapy Discharge Patient Details Name: Jon Rodriguez MRN: [id number] DOB: 02-Nov-1960 Today's Date: 04/12/2016 Time: 2878-6767 PT Time Calculation (min) (ACUTE ONLY): 20 min  Patient discharged from PT services secondary to goals met and no further PT needs identified.  Please see latest therapy progress note for current level of functioning and progress toward goals.    Progress and discharge plan discussed with patient and/or caregiver: Patient/Caregiver agrees with plan  GP     [facility] 04/12/2016, 4:20 PM  [place]

## 2016-04-13 ENCOUNTER — Inpatient Hospital Stay (HOSPITAL_COMMUNITY): Payer: 59

## 2016-04-13 NOTE — Progress Notes (Signed)
Spoke with Ultra sound department, The staff there stated that patients US guided Thoracentesis may be unable to be completed today, if so would be done in the morning. Lars Pinks [facility] made aware will monitor patient. Ayssa Bentivegna, Bettina Gavia RN

## 2016-04-13 NOTE — Progress Notes (Addendum)
      [address]       ,[place] [address]             [phone]        13 Days Post-Op Procedure(s) (LRB): REPLACEMENT OF ASCENDING AORTA USING A 55mm HEMASHIELD PLATINUM VASCULAR GRAFT (N/A)  Subjective: Patient states "breathing is so so".  Objective: Vital signs in last 24 hours: Temp:  [97.6 F (36.4 C)-98.3 F (36.8 C)] 98 F (36.7 C) (07/21 0529) Pulse Rate:  [74-81] 79 (07/21 0529) Cardiac Rhythm:  [-] Normal sinus rhythm (07/20 1900) Resp:  [19] 19 (07/21 0529) BP: (124-154)/(49-69) 154/69 mmHg (07/21 0529) SpO2:  [92 %-98 %] 92 % (07/21 0529) Weight:  [284 lb 8 oz (129.048 kg)] 284 lb 8 oz (129.048 kg) (07/21 0529)  Pre op weight 129 kg Current Weight  04/13/16 284 lb 8 oz (129.048 kg)      Intake/Output from previous day: 07/20 0701 - 07/21 0700 In: 720 [P.O.:720] Out: -    Physical Exam:  Cardiovascular: RRR Pulmonary: Clear to auscultation on right and diminished on left  Abdomen: Soft, non tender, bowel sounds present. Extremities: Mild bilateral lower extremity edema. Wounds: Clean and dry.  No erythema or signs of infection.  Lab Results: CBC:  Recent Labs  04/12/16 0440  WBC 11.1*  HGB 9.1*  HCT 27.5*  PLT 530*   BMET:   Recent Labs  04/12/16 0440  NA 134*  K 4.7  CL 98*  CO2 29  GLUCOSE 104*  BUN 16  CREATININE 0.99  CALCIUM 8.6*    PT/INR:  Lab Results  Component Value Date   INR 1.35 04/02/2016   INR 1.18 04/01/2016   INR 1.13 04/01/2016   ABG:  INR: Will add last result for INR, ABG once components are confirmed Will add last 4 CBG results once components are confirmed  Assessment/Plan:  1. CV - HR in the 70's. Blood pressure better controlled. On Lopressor 25 mg bid and Lisinopril 20 mg bid.  2.  Pulmonary - On 4 liters of oxygen via [place]. Attempting to wean oxygen. CXR this am shows LLL atelectasis and effusion and right lung is clear. Has had previous thoracentesis (on 07/17) but appears  needs again. Will ask IR to do left thoracentesis.Encourage incentive spirometer and flutter valve. 3. Volume Overload - On Lasix 40 mg daily 4.  Acute blood loss anemia - Last H and H stable at 9.1 and 27.5 5. Hopefully, home Sunday or Monday  ZIMMERMAN,DONIELLE MPA-C 04/13/2016,7:41 AM

## 2016-04-13 NOTE — Progress Notes (Signed)
Removed every other staple as ordered from [facility]. Patient tolerated well will monitor patient. Alvia Tory, [employer]

## 2016-04-13 NOTE — Progress Notes (Signed)
CARDIAC REHAB PHASE I   PRE:  Rate/Rhythm: 83 SR  BP:  Supine:   Sitting: 133/55  Standing:    SaO2: 95% 4L  MODE:  Ambulation: 450 ft   POST:  Rate/Rhythm: 109 ST  BP:  Supine:   Sitting: 146/59  Standing:    SaO2: 93-96% 4L 1010-1032 Pt really wants to go home on Sunday and tearful when talking about it. He is still very SOB when up and since he needs thoracentesis I did not lower oxygen. Monitored walk on 4L and sats 93-96%. Pt had to stop 3 times to rest due to SOB.  Encouraged pt to have staff lower oxygen after procedure. We will follow up tomorrow.    Graylon Good, RN BSN  04/13/2016 10:29 AM

## 2016-04-13 NOTE — Discharge Instructions (Signed)
Median Sternotomy  After Care Refer to this sheet in the next few weeks. These instructions provide you with information on caring for yourself after your procedure. Your health care provider may also give you specific instructions. Your treatment has been planned according to current medical practices, but problems sometimes occur. Call your health care provider if you have any problems or questions after your procedure. HOME CARE INSTRUCTIONS   Take medicines only as directed by your health care provider.  If your health care provider has prescribed elastic stockings, wear them as directed.  Take frequent naps or rest often throughout the day.  Avoid lifting over 10 lbs (4.5 kg) or pushing or pulling things with your arms for 6-8 weeks or as directed by your health care provider.  Avoid driving or airplane travel for 4-6 weeks after surgery or as directed by your health care provider. If you are riding in a car for an extended period, stop every 1-2 hours to stretch your legs. Keep a record of your medicines and medical history with you when traveling.  Do not drive or operate heavy machinery while taking pain medicine. (narcotics).  Do not cross your legs.  Do not use any tobacco products including cigarettes, chewing tobacco, or electronic cigarettes. If you need help quitting, ask your health care provider.  Do not take baths, swim, or use a hot tub until your health care provider approves. Take showers once your health care provider approves. Pat incisions dry. Do not rub incisions with a washcloth or towel.  Avoid climbing stairs and using the handrail to pull yourself up for the first 2-3 weeks after surgery.  Return to work as directed by your health care provider.  Drink enough fluid to keep your urine clear or pale yellow.  Do not strain to have a bowel movement. Eat high-fiber foods if you become constipated. You may also take a medicine to help you have a bowel movement  (laxative) as directed by your health care provider.  Resume sexual activity as directed by your health care provider. Men should not use medicines for erectile dysfunction until their doctor says it isokay.  If you had a certain type of heart condition in the past, you may need to take antibiotic medicine before having dental work or surgery. Let your dentist and health care providers know if you had one or more of the following:  Previous endocarditis.  An artificial (prosthetic) heart valve.  Congenital heart disease. SEEK MEDICAL CARE IF:  You develop a skin rash.   You experience sudden changes in your weight.  You have a fever. SEEK IMMEDIATE MEDICAL CARE IF:   You develop chest pain that is not coming from your incision.  You have drainage (pus), redness, swelling, or pain at your incision site.   You develop shortness of breath or have difficulty breathing.   You have increased bleeding from your incision site.   You develop light-headedness.  MAKE SURE YOU:   Understand these directions.  Will watch your condition.  Will get help right away if you are not doing well or get worse.   This information is not intended to replace advice given to you by your health care provider. Make sure you discuss any questions you have with your health care provider.   Document Released: 03/29/2005 Document Revised: 10/01/2014 Document Reviewed: 06/24/2012 Elsevier Interactive Patient Education [employer].

## 2016-04-13 NOTE — Progress Notes (Signed)
Patient ambulated x1 assist with oxygen at 4 L to ambulate 350 feet. Patient with 2 short standing rest breaks, back in room while sitting oxygen sats 97% on 4 L. Will continue to monitor patient. Therese Rocco, Bettina Gavia RN

## 2016-04-13 NOTE — Discharge Summary (Signed)
Physician Discharge Summary       [address]       ,[place] [address]             [phone]    Patient ID: Jon Rodriguez MRN: [id number] DOB/AGE: 10/26/1960 55 y.o.  Admit date: 03/31/2016 Discharge date: 04/16/2016  Admission Diagnoses: Aortic dissection, thoracic (HCC)  Active Diagnoses:  1. AAA (abdominal aortic aneurysm, ruptured) ([facility]) 2. Hypertension 3. Tobacco abuse 4. Hyperlipidemia 5. Acute blood loss anemia 6. Obesity 7. Left pleural effusion   Consults: Physical medicine ,urology, and critical care  Procedure (s):  A 16-French Coude Foley catheter was placed by Dr. Jeffie Pollock on 03/31/2016.  1. Emergency repair of type A ascending aortic dissection using a 30 mm Hemashield graft, replacement of the ascending aorta to the proximal arch. 2. Resuspension of the aortic valve for aortic insufficiency. 3. Hypothermic circulatory arrest with antegrade cerebral perfusion. 4. Placement of intra-aortic balloon pump for postop pump RV  dysfunction by Dr. Prescott Gum on 03/31/2016.  History of Presenting Illness: This is a 55 year old morbidly obese nondiabetic male with history of treated hypertension and moderate smoking felt sudden tearing chest pain at 11 AM today a working out at a fitness center- gym. This did not resolve and he presented to the emergency department. EKG was nonspecific and trach enzymes were first negative and minimally elevated. CT scan rule out pulmonary embolus was negative. The patient was then re-dosed with contrast for a CTA angiogram and this demonstrates a tear in the ascending aorta extending through the arch and descending thoracic aorta down to the aortic abdominal bifurcation. The innominate artery is not involved by the dissection, the left carotid is not involved, the left subclavian artery appears to be involved with the dissection. The renal and visceral vessels come off the true lumen. The ascending aorta and arch are  dilated. The descending thoracic aorta appears to be of normal caliber. There is no clear pericardial effusion.  The patient is still having some substernal pain but not as severe as the onset.  Patient denies family history of aortic thoracic or abdominal aneurysm disease. He has had an appendectomy but otherwise a fairly negative past medical history. He took Norvasc for hypertension but was recently taken off Norvasc after losing weight and his blood pressure normalized.  This is a 55 year old obese male with a history of hypertension and smoking presents with acute onset of chest pain and diagnosed by CTA as having type A aortic dissection with a tear originating close to the coronary ostia. He is currently hemodynamically stable and coherent. He has a poor left radial pulse with some weakness in the left hand grip. Dr. Prescott Gum discussed the need for emergent repair of Type A aortic dissection. Potential risks, benefits, and complications were discussed with the patient and he agreed to proceed with surgery.   Brief Hospital Course:  The patient was extubated 07/14/2017without difficulty. He remained afebrile and hemodynamically stable. He was weaned off the IABP. He was weaned off Dopamine and Milrinone drip. Gordy Councilman, a line, chest tubes, and foley were removed early in the post operative course. Lopressor was started and titrated accordingly. He was volume over loaded and diuresed. He was found to have a left pleural effusion. He had a left thoracentesis on 04/09/2016. He had about 550 cc of ser sanguinous fluid removed. He had ABL anemia. He did require a post op transfusion. His last H and H was 9.1/27.5. He was weaned  off the insulin drip. The patient's HGA1C pre op was 5.  The patient was felt surgically stable for transfer from the ICU to [facility] for further convalescence on 04/11/2016. He continues to progress with cardiac rehab. He was requiring several liters of oxygen via [place]. We will  attempt to wean him to room air. He has been tolerating a diet and has had a bowel movement. He had a recurrent left pleural effusion. He underwent a left thoracentesis by IR on 04/13/2016.  450 cc of fluid was removed. Epicardial pacing wires were removed on 04/14/2016. Chest tube sutures will be removed the day of discharge. The patient is felt surgically stable for discharge today.  Latest Vital Signs: Blood pressure (!) 147/68, pulse 71, temperature 98.8 F (37.1 C), temperature source Oral, resp. rate 18, height 6' (1.829 m), weight 283 lb 8 oz (128.6 kg), SpO2 (!) 89 %.  Physical Exam: Cardiovascular: RRR Pulmonary: Clear to auscultation on right and diminished on left  Abdomen: Soft, non tender, bowel sounds present. Extremities: Mild bilateral lower extremity edema. Wounds: Clean and dry. No erythema or signs of infection.  Discharge Condition:Stable and discharged to Home  Recent laboratory studies:  Lab Results  Component Value Date   WBC 11.1 (H) 04/12/2016   HGB 9.1 (L) 04/12/2016   HCT 27.5 (L) 04/12/2016   MCV 94.5 04/12/2016   PLT 530 (H) 04/12/2016   Lab Results  Component Value Date   NA 134 (L) 04/12/2016   K 4.7 04/12/2016   CL 98 (L) 04/12/2016   CO2 29 04/12/2016   CREATININE 0.99 04/12/2016   GLUCOSE 104 (H) 04/12/2016    Diagnostic Studies: Dg Chest 2 View  04/13/2016  CLINICAL DATA:  Thoracic aortic dissection and repair, rupture of abdominal aortic aneurysm, acute blood loss anemia EXAM: CHEST  2 VIEW COMPARISON:  Chest x-ray of April 12, 2016 FINDINGS: The right lung is adequately inflated. There is a small posterior layering right pleural effusion. On the left there is persistent retrocardiac density and moderate-sized pleural effusion. The cardiac silhouette remains enlarged. The pulmonary vascularity is not engorged. The sternal wires are intact. There is mild mediastinal widening, stable. The bony thorax exhibits no acute abnormality. A left-sided PICC  line tip projects over the midportion of the SVC. IMPRESSION: Stable appearance of the chest since yesterday's study. Persistent moderate-sized left pleural effusion with left basilar atelectasis or pneumonia. Stable cardiomegaly and mild mediastinal widening. Electronically Signed   By: David  Martinique M.D.   On: 04/13/2016 07:42   Ct Chest Wo Contrast  04/10/2016  CLINICAL DATA:  Patient status post recent ascending aortic repair for type A aortic dissection. Patient with chest pain. EXAM: CT CHEST WITHOUT CONTRAST TECHNIQUE: Multidetector CT imaging of the chest was performed following the standard protocol without IV contrast. COMPARISON:  CTA chest 03/31/2016.  Chest radiograph 04/10/2016. FINDINGS: Cardiovascular: Postsurgical changes compatible with ascending aortic repair. Evaluation of the aorta is limited without intravenous contrast material. There is persistent aneurysmal dilatation of the mid aspect of the aortic arch measuring up to 6.5 cm. The descending thoracic aorta is unremarkable in caliber. There is a new moderate pericardial effusion. Additionally there is a small amount of fluid surrounding the ascending aorta. Normal heart size. Pulmonary artery is normal in caliber. Left upper extremity PICC line tip terminates in the superior vena cava. Mediastinum/Nodes: No enlarged axillary, mediastinal or hilar lymphadenopathy. Normal esophagus. Lungs/Pleura: Central airways are patent. Small bilateral pleural effusions, left-greater-than-right. There is consolidation within the  left-greater-than-right lower lobes most suggestive of atelectasis. Small focal area of consolidation within the peripheral right upper lobe (image 81; series 3) favored to represent atelectasis. No pneumothorax. Upper Abdomen: Liver is diffusely low in attenuation compatible with steatosis. Lobular contour of the superior pole of the right kidney. The adrenal glands are normal. Musculoskeletal: Thoracic spine degenerative  changes. Patient status post median sternotomy. Postsurgical clips within the soft tissues underlying the right pectoralis musculature (image 21; series 3). IMPRESSION: Postsurgical changes compatible with ascending thoracic aorta repair. 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 Small to moderate pericardial effusion and small amount of fluid surrounding the ascending thoracic aorta, likely postprocedural in etiology. Small bilateral pleural effusions, left-greater-than-right with underlying consolidative opacities favored to represent atelectasis . Hepatic steatosis. Electronically Signed   By: Lovey Newcomer M.D.   On: 04/10/2016 14:17   Ct Angio Chest Pe W Or Wo Contrast  03/31/2016  EXAM: CT ANGIOGRAPHY CHEST WITH CONTRAST TECHNIQUE: Multidetector CT imaging of the chest was performed using the standard protocol during bolus administration of intravenous contrast. Multiplanar CT image reconstructions and MIPs were obtained to evaluate the vascular anatomy. CONTRAST:  70 mL of Isovue-300. COMPARISON:  No priors. FINDINGS: Mediastinum/Lymph Nodes: There is irregularity of the thoracic aorta. Specifically, associated with the left wall and posterior wall of the aortic root extending into the ascending thoracic aorta to the level the distal arch there is some eccentric high attenuation material which distorts the normal aortic contours. This is strongly suggestive of acute intramural hemorrhage, although given the dilatation of the mid aortic arch, the possibility of concurrent aortic dissection is suspected. The proximal aspect of this process comes in direct contact with the ostium of the left main coronary artery, placing the patient at risk for acute devastating coronary ischemia. Notably, the high attenuation in the ascending thoracic aorta appears to traverse into the adjacent right lateral wall of the pulmonic trunk, indicative of spread of hemorrhage via a common shared  adventitia between the vessels. Some of this hemorrhage also extends anteriorly into the middle mediastinum best appreciated on image 62 of series 4 where this currently appears contained. There is another small amount of high attenuation material located anterior to the aortic root on image 73 of series 4, also likely to represent a small amount of hemomediastinum. Although difficult to evaluate on today's nongated image, the aortic root is aneurysmally dilated, estimated to measure approximately 6.2 cm at the level of the sinuses of Valsalva. Ascending aorta is also aneurysmal measuring 5.6 cm. As previously noted, mid arch is aneurysmal measuring 6.8 cm. Descending thoracic aorta is normal in caliber measuring 3.4 cm. There is also coronary artery atherosclerosis, including calcified atherosclerotic plaque in the left main, left anterior descending, left circumflex and right coronary arteries. There is a small amount of pericardial fluid, which appears low-attenuation. No frank hemopericardium is identified at this time. There are no filling defects within the pulmonary arterial tree to suggest underlying pulmonary embolism. Heart size is normal. No pathologically enlarged mediastinal or hilar lymph nodes. Esophagus is unremarkable in appearance. No axillary lymphadenopathy. Lungs/Pleura: No acute consolidative airspace disease. No pleural effusions. No suspicious appearing pulmonary nodules or masses are noted. Upper Abdomen: Colonic diverticulae are noted in the region of the splenic flexure of the colon. Musculoskeletal/Soft Tissues: There are no aggressive appearing lytic or blastic lesions noted in the visualized portions of the skeleton. Review of the MIP images confirms the above findings. IMPRESSION: 1. Findings  are compatible with an acute aortic syndrome, as detailed above. The most concerning possibility is that of a type A thoracic aortic dissection with pending compromise of the left main coronary  artery, and early acute hemorrhage into the middle mediastinum. No frank hemopericardium identified at this time. 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 These findings would be better evaluated with immediate CTA of the thorax, which is recommended at this time. Emergent cardiothoracic surgery consultation is also strongly recommended. 2. No evidence of pulmonary embolism. These results were called by telephone at the time of interpretation on 03/31/2016 at 4:18 pm to [facility] Dr. Lajean Saver, who verbally acknowledged these results. Electronically Signed   By: Vinnie Langton M.D.   On: 03/31/2016 16:31   Dg Chest Port 1 View  04/10/2016  CLINICAL DATA:  Status post CABG and ascending aortic replacement of 31 March 2016 EXAM: PORTABLE CHEST 1 VIEW COMPARISON:  Portable chest x-ray of April 09, 2016 FINDINGS: The right lung is adequately inflated. The interstitial markings remain coarse. On the left there is persistent volume loss with moderate-sized left pleural effusion. The interstitial markings remain increased. The cardiac silhouette remains enlarged. The pulmonary vascularity is mildly prominent centrally. The left PICC line tip projects over the proximal SVC. IMPRESSION: Fairly stable appearance of the chest. Persistent cardiomegaly, central pulmonary vascular congestion, left pleural effusion, and lower lobe atelectasis or pneumonia. No pneumothorax. Electronically Signed   By: David  Martinique M.D.   On: 04/10/2016 07:19    Dg Abd Portable 1v  04/04/2016  CLINICAL DATA:  Feeding tube placement EXAM: PORTABLE ABDOMEN - 1 VIEW COMPARISON:  None. FINDINGS: Normal small bowel gas pattern. These NG feeding tube in place with tip in distal duodenum. IMPRESSION: NG feeding tube in place with tip in distal duodenum. Electronically Signed   By: Lahoma Crocker M.D.   On: 04/04/2016 11:15   Ct Angio Chest/abd/pel For Dissection W  And/or W/wo  03/31/2016  CLINICAL DATA:  55 year old male EXAM: CT CHEST, ABDOMEN, AND PELVIS WITH CONTRAST TECHNIQUE: Multidetector CT imaging of the chest, abdomen and pelvis was performed following the standard protocol during bolus administration of intravenous contrast. CONTRAST:  70 mL of Isovue 370 COMPARISON:  PE protocol chest CT 03/31/2016. FINDINGS: CTA CHEST Mediastinum/Lymph Nodes: As previously suspected there is a type A thoracic aortic dissection which originates at the level of the aortic root, likely involving the commissure between the left and non coronary cusps of the aortic valve (best appreciated on axial image 67 of series 4). The dissection flap impinges upon the ostium of the right coronary artery (best appreciated on axial image 59 of series 4). The true lumen gives rise to the left main coronary artery, although the dissection flap does come in very close proximity to the ostium of the vessel, best appreciated on axial images 49 and 50 of series 4. There is IV contrast in the left main, left anterior descending, left circumflex and right coronary arteries at this time, indicative of gross patency. 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 Ascending thoracic aorta is aneurysmal measuring 5.7 cm in diameter. Mid aortic arch is aneurysmal measuring 6.8 cm in diameter. The dissection flap extends into the ostium and proximal aspect of the left subclavian artery. The great vessels of the mediastinum are incompletely visualized secondary to incomplete thoracic coverage, however, the visualized  portions of the proximal brachiocephalic artery and left common carotid artery do not appear to be involved with the dissection at this time. The dissection extends into the pelvis where it appears to terminate at the level of the aortic bifurcation, as detailed below. As previously described, there is some high attenuation material  located between the aortic root and proximal ascending thoracic aorta, and the adjacent pulmonic trunk, indicative of some acute hemorrhage into the shared adventitia between the 2 vessels. Some of this hemorrhage extends anteriorly into the middle mediastinum, best appreciated on axial image 43 of series 4 and axial image 54 of series 4), where it appears to be within the epicardial fat. There is a small amount of low-attenuation pericardial fluid, but no frank hemopericardium at this time. Heart size is normal. Atherosclerotic calcifications are noted in the left main, left anterior descending, left circumflex and right coronary arteries. Esophagus is normal in appearance. No axillary lymphadenopathy. Lungs/Pleura: No acute consolidative airspace disease. No pleural effusions. No pneumothorax. No suspicious appearing pulmonary nodules or masses. Musculoskeletal/Soft Tissues: There are no aggressive appearing lytic or blastic lesions noted in the visualized portions of the skeleton. CTA ABDOMEN AND PELVIS Vascular: The aortic dissection extends into the pelvis and appears to terminate at the level of the aortic bifurcation, although accurate assessment is hindered by suboptimal contrast bolus in the pelvis and possible extension into the left common iliac artery is not excluded. In the abdomen, the true lumen gives rise to the celiac axis, superior mesenteric artery, inferior mesenteric artery and the right renal artery. The left renal artery is fed partially by the true lumen, however, the dissection flap appears to extend into the proximal aspect of the vessel, with the majority of flow being distributed by the false lumen. There appear to be single renal arteries bilaterally. As previously mentioned, the dissection flap appears to terminate at the level of the bifurcation, with the true lumen giving rise to the right common iliac artery in the false lumen given rise to the left common iliac artery. 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 Hepatobiliary: No cystic or solid hepatic lesions. No intra or extrahepatic biliary ductal dilatation. Gallbladder is normal in appearance. Pancreas: No pancreatic mass. No pancreatic ductal dilatation. No pancreatic or peripancreatic fluid or inflammatory changes. Spleen: Unremarkable. Adrenals/Urinary Tract: Slight differential perfusion of the kidneys is noted, likely reflective of differential flow to both kidneys (with higher attenuation flow into the right kidney fed solely by the true lumen and relatively lower attenuation flow to the left kidney which is fed predominantly by the false lumen). Findings do not favor frank renal infarction at this time. There is a small wedge-shaped parenchymal defect in the upper pole the right kidney, presumably scar from prior renal infarction or prior infection. No definite suspicious renal lesions. No hydroureteronephrosis in the visualized portions of the abdomen or pelvis. Urinary bladder is not visualized. Bilateral adrenal glands are normal in appearance. Stomach/Bowel: The stomach is normal in appearance. No pathologic dilatation of visualized portions of the small bowel or colon. Mild colonic diverticulosis is noted, without evidence of surrounding inflammation to indicate acute diverticulitis at this time. Lymphatic: No lymphadenopathy noted in the abdomen or visualized portions of the upper pelvis. Reproductive: Incompletely visualized secondary to technologist error. Other: No significant volume of ascites and no pneumoperitoneum in the visualized portions of the peritoneal cavity. Musculoskeletal: There  are no aggressive appearing lytic or blastic lesions noted in the visualized portions of the skeleton. IMPRESSION: 1. Type A thoracic aortic dissection confirmed, with extension into the pelvis apparently terminating at the level  of the aortic bifurcation. The dissection flap originates at the level of the aortic root with possible compromise of the aortic valve which could place the patient at risk for acute aortic regurgitation, and impingement upon the ostium of the right coronary artery, and to a lesser degree the left coronary artery ostium. The dissection flap extends into the proximal left subclavian artery, and also extends into the left renal artery. 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 As previously indicated, emergent Cardiothoracic Surgery consultation is strongly recommended. 2. Additional incidental findings, as above. These results were called by telephone at the time of interpretation on 03/31/2016 at 5:05 pm to Dr. Rhodia Albright, who verbally acknowledged these results. Electronically Signed   By: Vinnie Langton M.D.   On: 03/31/2016 17:13   Discharge Instructions    Amb Referral to Cardiac Rehabilitation    Complete by:  As directed   Diagnosis:  Valve Replacement   Valve:  Aortic     Discharge Medications:   Medication List    TAKE these medications   acetaminophen 325 MG tablet Commonly known as:  TYLENOL Take 2 tablets (650 mg total) by mouth every 6 (six) hours as needed for mild pain or fever.   amLODipine 5 MG tablet Commonly known as:  NORVASC Take 1 tablet (5 mg total) by mouth daily.   aspirin 325 MG EC tablet Take 325 mg by mouth daily.   FISH OIL + D3 PO Take 2 capsules by mouth daily.   furosemide 40 MG tablet Commonly known as:  LASIX Take 1 tablet (40 mg total) by mouth daily.   lisinopril 20 MG tablet Commonly known as:  PRINIVIL,ZESTRIL Take 1 tablet (20 mg total) by mouth 2 (two) times daily.   MAGNESIUM-POTASSIUM PO Take 1 tablet by mouth daily.   metoprolol tartrate 25 MG tablet Commonly known as:  LOPRESSOR Take 1 tablet (25 mg total) by mouth 2 (two) times daily.   multivitamin capsule Take 1 capsule by mouth daily.     oxyCODONE 5 MG immediate release tablet Commonly known as:  Oxy IR/ROXICODONE Take 1-2 tablets (5-10 mg total) by mouth every 3 (three) hours as needed for severe pain.   potassium chloride SA 20 MEQ tablet Commonly known as:  K-DUR,KLOR-CON Take 1 tablet (20 mEq total) by mouth daily. For 14 days   simvastatin 20 MG tablet Commonly known as:  ZOCOR Take 1 tablet by mouth daily.       Follow Up Appointments: Follow-up Information    Tharon Aquas Trigt III, MD Follow up on 05/16/2016.   Specialty:  Cardiothoracic Surgery Why:  PA/LAT CXR to be taken (at [facility] which is in the same building as Dr. Lucianne Lei Trigt's office) on 05/16/2016 at 2:00 pm;Appointment time is at 2:30 pm Contact information: [place] [address] [phone]        [facility] .   Why:  Office will contact you to set up appointment to establish care for BP control Contact information: [place] [phone]          Signed: Cinda Quest 04/16/2016, 7:55 AM  patient examined and medical record reviewed,agree with above note. Tharon Aquas Trigt III 04/16/2016

## 2016-04-14 ENCOUNTER — Inpatient Hospital Stay (HOSPITAL_COMMUNITY): Payer: 59

## 2016-04-14 MED ORDER — LIDOCAINE HCL 1 % IJ SOLN
INTRAMUSCULAR | Status: AC
  Filled 2016-04-14: qty 20

## 2016-04-14 NOTE — Progress Notes (Addendum)
      [address]       [place] [address]             [phone]      14 Days Post-Op Procedure(s) (LRB): REPLACEMENT OF ASCENDING AORTA USING A 13mm HEMASHIELD PLATINUM VASCULAR GRAFT (N/A)   Subjective:  Jon Rodriguez states he is doing okay.  He states he wants to go home.  He also questions that his Thoracentesis will get done today, as he sat all day yesterday waiting for it to be done.  Objective: Vital signs in last 24 hours: Temp:  [97.9 F (36.6 C)-98.5 F (36.9 C)] 98.5 F (36.9 C) (07/22 0434) Pulse Rate:  [73-86] 78 (07/22 0434) Cardiac Rhythm:  [-] Normal sinus rhythm (07/21 1950) Resp:  [18] 18 (07/22 0434) BP: (107-136)/(55-72) 136/70 mmHg (07/22 0434) SpO2:  [94 %-97 %] 94 % (07/22 0434) Weight:  [280 lb 14.4 oz (127.415 kg)] 280 lb 14.4 oz (127.415 kg) (07/22 0434)  Intake/Output from previous day: 07/21 0701 - 07/22 0700 In: 480 [P.O.:480] Out: -   General appearance: alert, cooperative and no distress Heart: regular rate and rhythm Lungs: diminished on left Abdomen: soft, non-tender; bowel sounds normal; no masses,  no organomegaly Extremities: edema trace Wound: clean and dry  Lab Results:  Recent Labs  04/12/16 0440  WBC 11.1*  HGB 9.1*  HCT 27.5*  PLT 530*   BMET:  Recent Labs  04/12/16 0440  NA 134*  K 4.7  CL 98*  CO2 29  GLUCOSE 104*  BUN 16  CREATININE 0.99  CALCIUM 8.6*    PT/INR: No results for input(s): LABPROT, INR in the last 72 hours. ABG    Component Value Date/Time   PHART 7.470* 04/07/2016 0315   HCO3 38.4* 04/07/2016 0315   TCO2 40.0 04/07/2016 0315   ACIDBASEDEF 2.0 04/01/2016 1205   O2SAT 59.1 04/07/2016 0350   CBG (last 3)  No results for input(s): GLUCAP in the last 72 hours.  Assessment/Plan: S/P Procedure(s) (LRB): REPLACEMENT OF ASCENDING AORTA USING A 81mm HEMASHIELD PLATINUM VASCULAR GRAFT (N/A)  1. CV- remains in NSR, hemodynamically stable- continue Lopressor, Lisinopril 2.  Pulm- remains on oxygen, down to 3L, probable large to moderate pleural effusion on left... For Thoracentesis this morning 3. Renal- creatinine has been WNL, weight is at baseline, mild hypervolemia on exam.. Continue Lasix 4. Dispo- patient stable, for thoracentesis today, continue to wean oxygen.... If can get patient to 2L of oxygen use of better, can d/c home in AM if remains stable   LOS: 14 days    Ellwood Handler 04/14/2016   Chart reviewed, patient examined, agree with above. He has a left thoracentesis today removing 450 cc of old bloody fluid. His CXR shows cardiomegaly and persistent haziness at left base consistent with atelectasis or consolidation. He says he is breathing better since the procedure.

## 2016-04-14 NOTE — Progress Notes (Signed)
External pacing wires pulled without difficulty.  Pt tolerated well,  1hr bedrest until 11:40a.m.

## 2016-04-14 NOTE — Progress Notes (Signed)
Pt ambulated in hall around 400' independently on 3L O2.  Stopped to rest a couple of times, but was able to maintain conversation throughout the walk.  Pt now back in room O2 sats 97-98% on 3L, weaned down to 2L and holding steady at 96%.  Will reassess in an hour.

## 2016-04-14 NOTE — Procedures (Signed)
Successful US guided left thoracentesis. Yielded 459mL of bloody pleural fluid. Pt tolerated procedure well. No immediate complications.  Specimen was not sent for labs. CXR ordered.  Ascencion Dike PA-C 04/14/2016 9:11 AM

## 2016-04-14 NOTE — Progress Notes (Addendum)
CARDIAC REHAB PHASE I  [id number]   Pt ambulated with RN.  Educated pt on bed mobility and sit to stand. Pt demonstrated movement successfully and without pain. Additional education on wound care, sternal precautions, daily weight checks, lifting and activity restrictions. Handout given on [facility] diet and exercise guidelines (temperature/emergency precaution. Practice use of IS. Pt voiced understanding. Pt is eager to go home.  Dynasti Kerman D Derak Schurman,MS,ACSM-RCEP 04/14/2016 3:26 PM

## 2016-04-15 ENCOUNTER — Inpatient Hospital Stay (HOSPITAL_COMMUNITY): Payer: 59

## 2016-04-15 MED ORDER — METOPROLOL TARTRATE 25 MG PO TABS: 25.0000 mg | ORAL_TABLET | Freq: Two times a day (BID) | ORAL | 3 refills | Status: DC

## 2016-04-15 MED ORDER — AMLODIPINE BESYLATE 5 MG PO TABS: 5.0000 mg | ORAL_TABLET | Freq: Every day | ORAL | 3 refills | Status: DC

## 2016-04-15 MED ORDER — LISINOPRIL 20 MG PO TABS: 20.0000 mg | ORAL_TABLET | Freq: Two times a day (BID) | ORAL | 3 refills | Status: DC

## 2016-04-15 MED ORDER — AMLODIPINE BESYLATE 5 MG PO TABS: 5.0000 mg | ORAL_TABLET | Freq: Every day | ORAL | Status: DC

## 2016-04-15 MED ORDER — FUROSEMIDE 40 MG PO TABS: 40.0000 mg | ORAL_TABLET | Freq: Every day | ORAL | 0 refills | Status: DC

## 2016-04-15 MED ORDER — OXYCODONE HCL 5 MG PO TABS: 5.0000 mg | ORAL_TABLET | ORAL | 0 refills | Status: DC | PRN

## 2016-04-15 MED ORDER — AMLODIPINE BESYLATE 5 MG PO TABS
5.0000 mg | ORAL_TABLET | Freq: Every day | ORAL | Status: DC
  Administered 2016-04-15 – 2016-04-16 (×2): 5 mg via ORAL
  Filled 2016-04-15 (×2): qty 1

## 2016-04-15 MED ORDER — ACETAMINOPHEN 325 MG PO TABS: 650.0000 mg | ORAL_TABLET | Freq: Four times a day (QID) | ORAL | Status: DC | PRN

## 2016-04-15 NOTE — Progress Notes (Addendum)
      [address]       [place] [address]             [phone]      15 Days Post-Op Procedure(s) (LRB): REPLACEMENT OF ASCENDING AORTA USING A 31mm HEMASHIELD PLATINUM VASCULAR GRAFT (N/A)   Subjective:  Jon Rodriguez states he is feeling and breathing much better.  He states he was able to ambulate yesterday without getting as winded.  He really wants to go home today.  Objective: Vital signs in last 24 hours: Temp:  [98.2 F (36.8 C)-98.8 F (37.1 C)] 98.8 F (37.1 C) (07/23 0521) Pulse Rate:  [67-74] 73 (07/23 0521) Cardiac Rhythm: Normal sinus rhythm (07/22 1939) Resp:  [12-20] 18 (07/23 0521) BP: (111-152)/(54-91) 121/63 (07/23 0521) SpO2:  [93 %-96 %] 93 % (07/23 0521) Weight:  [284 lb 4.8 oz (129 kg)] 284 lb 4.8 oz (129 kg) (07/23 0521)  Intake/Output from previous day: 07/22 0701 - 07/23 0700 In: 1080 [P.O.:1080] Out: -  Intake/Output this shift: No intake/output data recorded.  General appearance: alert, cooperative and no distress Heart: regular rate and rhythm Lungs: diminished breath sounds left base Abdomen: soft, non-tender; bowel sounds normal; no masses,  no organomegaly Extremities: edema trace Wound: clean and dry  Lab Results: No results for input(s): WBC, HGB, HCT, PLT in the last 72 hours. BMET: No results for input(s): NA, K, CL, CO2, GLUCOSE, BUN, CREATININE, CALCIUM in the last 72 hours.  PT/INR: No results for input(s): LABPROT, INR in the last 72 hours. ABG    Component Value Date/Time   PHART 7.470 (H) 04/07/2016 0315   HCO3 38.4 (H) 04/07/2016 0315   TCO2 40.0 04/07/2016 0315   ACIDBASEDEF 2.0 04/01/2016 1205   O2SAT 59.1 04/07/2016 0350   CBG (last 3)  No results for input(s): GLUCAP in the last 72 hours.  Assessment/Plan: S/P Procedure(s) (LRB): REPLACEMENT OF ASCENDING AORTA USING A 75mm HEMASHIELD PLATINUM VASCULAR GRAFT (N/A)  1. CV- NSR, HTN in the 150s at times- will continue Lopressor, Lisinopril, add  low dose Norvasc 2. Pulm- weaning oxygen, CXR shows improvement of pleural fluid, continued consolidation/atelectasis on the left 3. Renal- creatinine has been stable, weight is trending down, continue lasix 4. Dispo- patient stable, nursing will assess sats at rest and with ambulation to see if oxygen needed at home, will review CXR with Dr. Cyndia Bent, likely d/c this afternoon   LOS: 15 days    Jon Rodriguez 04/15/2016   Chart reviewed, patient examined, agree with above. His CXR looks stable. Sats on room air at rest were 91% and decreased to 89% while ambulating. I think it would be best to watch him off oxygen today and if he does well he could go home tomorrow. He says that he feels fine.

## 2016-04-15 NOTE — Progress Notes (Signed)
SATURATION QUALIFICATIONS: (This note is used to comply with regulatory documentation for home oxygen)  Patient Saturations on Room Air at Rest = 91%  Patient Saturations on Room Air while Ambulating = 89%  Patient Saturations on 1 Liters of oxygen while Ambulating = 92%  Please briefly explain why patient needs home oxygen:

## 2016-04-16 MED ORDER — POTASSIUM CHLORIDE CRYS ER 20 MEQ PO TBCR: 20.0000 meq | EXTENDED_RELEASE_TABLET | Freq: Every day | ORAL | 0 refills | Status: DC

## 2016-04-16 MED ORDER — POTASSIUM CHLORIDE CRYS ER 20 MEQ PO TBCR
20.0000 meq | EXTENDED_RELEASE_TABLET | Freq: Every day | ORAL | Status: DC
Start: 2016-04-16 — End: 2016-04-16
  Administered 2016-04-16: 20 meq via ORAL
  Filled 2016-04-16: qty 1

## 2016-04-16 NOTE — Progress Notes (Addendum)
      [address]       [place] [address]             [phone]      16 Days Post-Op Procedure(s) (LRB): REPLACEMENT OF ASCENDING AORTA USING A 70mm HEMASHIELD PLATINUM VASCULAR GRAFT (N/A)   Subjective:  [name] has no complaints.  He has been off oxygen since yesterday afternoon.Marland Kitchen  He wants to go home.  Objective: Vital signs in last 24 hours: Temp:  [98.2 F (36.8 C)-98.8 F (37.1 C)] 98.8 F (37.1 C) (07/24 0413) Pulse Rate:  [71-78] 71 (07/24 0413) Cardiac Rhythm: Normal sinus rhythm (07/24 0700) Resp:  [18-20] 18 (07/24 0413) BP: (105-152)/(44-68) 147/68 (07/24 0413) SpO2:  [89 %-94 %] 89 % (07/24 0413) Weight:  [283 lb 8 oz (128.6 kg)] 283 lb 8 oz (128.6 kg) (07/24 0413)  Intake/Output from previous day: 07/23 0701 - 07/24 0700 In: 370 [P.O.:370] Out: -   General appearance: alert, cooperative and no distress Heart: regular rate and rhythm Lungs: diminished breath sounds on left Abdomen: soft, non-tender; bowel sounds normal; no masses,  no organomegaly Extremities: edema 1+ pitting Wound: clean and dry  Lab Results: No results for input(s): WBC, HGB, HCT, PLT in the last 72 hours. BMET: No results for input(s): NA, K, CL, CO2, GLUCOSE, BUN, CREATININE, CALCIUM in the last 72 hours.  PT/INR: No results for input(s): LABPROT, INR in the last 72 hours. ABG    Component Value Date/Time   PHART 7.470 (H) 04/07/2016 0315   HCO3 38.4 (H) 04/07/2016 0315   TCO2 40.0 04/07/2016 0315   ACIDBASEDEF 2.0 04/01/2016 1205   O2SAT 59.1 04/07/2016 0350   CBG (last 3)  No results for input(s): GLUCAP in the last 72 hours.  Assessment/Plan: S/P Procedure(s) (LRB): REPLACEMENT OF ASCENDING AORTA USING A 102mm HEMASHIELD PLATINUM VASCULAR GRAFT (N/A)  1. CV- NSR, remains hemodynamically stable.... Lopressor, Lisinopril, Norvasc 2. Pulm - off oxygen, sats are acceptable will not need home use 3. Renal- stable, remains hypervolemic, continue  Lasix 4. Dispo- patient stable, will d/c home today   LOS: 16 days    BARRETT, ERIN 04/16/2016 patient examined and medical record reviewed,agree with above note.Discharge instructions d/w patient Jon Rodriguez 04/16/2016

## 2016-04-16 NOTE — Progress Notes (Signed)
Pt doing well. Has walked numerous times without O2 and also slept without it last night. Reviewed ed, answered questions, and set up d/c video. Pt thankful and eager to d/c. [id number] Yves Dill CES, ACSM 10:07 AM 04/16/2016

## 2016-04-16 NOTE — Progress Notes (Signed)
Patient saturation Awake, on Room air: 92% Patient saturation Asleep on Room air: 88-90%, Monitored throughout the night.

## 2016-04-24 ENCOUNTER — Encounter: Payer: Self-pay | Admitting: Cardiology

## 2016-04-25 ENCOUNTER — Encounter: Payer: Self-pay | Admitting: Cardiology

## 2016-04-25 ENCOUNTER — Ambulatory Visit (INDEPENDENT_AMBULATORY_CARE_PROVIDER_SITE_OTHER): Payer: 59 | Admitting: Cardiology

## 2016-04-25 VITALS — BP 120/60 | HR 80 | Ht 72.0 in | Wt 269.8 lb

## 2016-04-25 DIAGNOSIS — Z9889 Other specified postprocedural states: Secondary | ICD-10-CM

## 2016-04-25 DIAGNOSIS — J90 Pleural effusion, not elsewhere classified: Secondary | ICD-10-CM

## 2016-04-25 DIAGNOSIS — I7101 Dissection of thoracic aorta: Secondary | ICD-10-CM | POA: Diagnosis not present

## 2016-04-25 DIAGNOSIS — D62 Acute posthemorrhagic anemia: Secondary | ICD-10-CM

## 2016-04-25 DIAGNOSIS — E785 Hyperlipidemia, unspecified: Secondary | ICD-10-CM | POA: Diagnosis not present

## 2016-04-25 DIAGNOSIS — I71019 Dissection of thoracic aorta, unspecified: Secondary | ICD-10-CM

## 2016-04-25 DIAGNOSIS — I10 Essential (primary) hypertension: Secondary | ICD-10-CM

## 2016-04-25 LAB — CBC WITH DIFFERENTIAL/PLATELET
Basophils Absolute: 66 cells/uL (ref 0–200)
Basophils Relative: 1 %
EOS PCT: 5 %
Eosinophils Absolute: 330 cells/uL (ref 15–500)
HCT: 33 % — ABNORMAL LOW (ref 38.5–50.0)
HEMOGLOBIN: 10.5 g/dL — AB (ref 13.2–17.1)
LYMPHS PCT: 20 %
Lymphs Abs: 1320 cells/uL (ref 850–3900)
MCH: 30.2 pg (ref 27.0–33.0)
MCHC: 31.8 g/dL — AB (ref 32.0–36.0)
MCV: 94.8 fL (ref 80.0–100.0)
MONO ABS: 792 {cells}/uL (ref 200–950)
MPV: 9.4 fL (ref 7.5–12.5)
Monocytes Relative: 12 %
NEUTROS PCT: 62 %
Neutro Abs: 4092 cells/uL (ref 1500–7800)
Platelets: 557 10*3/uL — ABNORMAL HIGH (ref 140–400)
RBC: 3.48 MIL/uL — AB (ref 4.20–5.80)
RDW: 14 % (ref 11.0–15.0)
WBC: 6.6 10*3/uL (ref 3.8–10.8)

## 2016-04-25 LAB — BASIC METABOLIC PANEL WITH GFR
BUN: 13 mg/dL (ref 7–25)
CALCIUM: 9.3 mg/dL (ref 8.6–10.3)
CHLORIDE: 105 mmol/L (ref 98–110)
CO2: 24 mmol/L (ref 20–31)
CREATININE: 1.02 mg/dL (ref 0.70–1.33)
GFR, Est African American: >89 mL/min (ref >=60)
GFR, Est Non African American: 83 mL/min (ref >=60)
Glucose, Bld: 93 mg/dL (ref 65–99)
Potassium: 5 mmol/L (ref 3.5–5.3)
Sodium: 140 mmol/L (ref 135–146)

## 2016-04-25 MED ORDER — POTASSIUM CHLORIDE CRYS ER 20 MEQ PO TBCR: 20.0000 meq | EXTENDED_RELEASE_TABLET | Freq: Every day | ORAL | 0 refills | Status: DC

## 2016-04-25 MED ORDER — AMLODIPINE BESYLATE 5 MG PO TABS: 5.0000 mg | ORAL_TABLET | Freq: Every day | ORAL | 3 refills | Status: DC

## 2016-04-25 MED ORDER — FUROSEMIDE 40 MG PO TABS: 40.0000 mg | ORAL_TABLET | Freq: Every day | ORAL | 0 refills | Status: DC

## 2016-04-25 NOTE — Patient Instructions (Signed)
Labs:  Your physician recommends that you return for lab work today.   Follow-up:  Your physician recommends that you schedule a follow-up appointment in: 6-8 weeks with Dr. Sallyanne Kuster.

## 2016-04-25 NOTE — Progress Notes (Signed)
Cardiology Office Note   Date:  04/25/2016   ID:  Jon Rodriguez, DOB [birth year], MRN [id number]  PCP:  No primary care provider on file.  Cardiologist:  Dr. Sallyanne Kuster    Chief Complaint  Patient presents with  . Hospitalization Follow-up      History of Present Illness: Jon Rodriguez is a 55 y.o. male who presents for post hospital for AA rupture and surgery.  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 Also with lt arm numbness.  Went to ER CT of chest revealed above.    ECG a little concerning for RCA involvement. STAT echo ordered. BP remains low and HR slow - no real room for beta blockade.  Was seen by Dr. Darcey Nora and Dr. Sallyanne Kuster. Echo was done.  EF normal and moderate AR.    He had IABP post op due to RV failure cardiogenic shock.  He had CCM follow as well due to oxygen requirements post op.   Post op day 6 extubated.   He underwent Lt thoracentesis by Dr. Cyndia Bent  On 04/09/16 550 cc removed  He underwent a left thoracentesis by IR on 04/14/2016.  450 cc of fluid was removed.    Pt progessed and was discharged on the 24th.  Since discharge he is progressing.  Weak and will hve 2 good days and then one very slow day.  He is walking, moving about more.  His wt is down from discharge.  He has been following and is down about half a pound a day.  He is eating 3 meals per day.  He is not smoking any.  Not sleeping well 5 hours a night and now up to 6 hours.  BP is well controlled, he is only taking one pain pill a night but regular tylenol during the day. He has no  palpitations or skipped beats.  He was anemic at hgb 9.1 at discharge - still pale will recheck CBC today and BMP while on lasix and K+.  Past Medical History:  Diagnosis Date  . High cholesterol   . Hypertension     Past Surgical History:  Procedure Laterality Date  . REPLACEMENT ASCENDING AORTA N/A 03/31/2016   Procedure: REPLACEMENT OF ASCENDING AORTA USING A 35mm HEMASHIELD PLATINUM VASCULAR GRAFT;  Surgeon: Ivin Poot, MD;  Location: [facility];  Service: Open Heart Surgery;  Laterality: N/A;     Current Outpatient Prescriptions  Medication Sig Dispense Refill  . acetaminophen (TYLENOL) 325 MG tablet Take 2 tablets (650 mg total) by mouth every 6 (six) hours as needed for mild pain or fever.    Marland Kitchen amLODipine (NORVASC) 5 MG tablet Take 1 tablet (5 mg total) by mouth daily. 30 tablet 3  . aspirin 325 MG EC tablet Take 325 mg by mouth daily.    . furosemide (LASIX) 40 MG tablet Take 1 tablet (40 mg total) by mouth daily. 14 tablet 0  . lisinopril (PRINIVIL,ZESTRIL) 20 MG tablet Take 1 tablet (20 mg total) by mouth 2 (two) times daily. 60 tablet 3  . metoprolol tartrate (LOPRESSOR) 25 MG tablet Take 1  tablet (25 mg total) by mouth 2 (two) times daily. 60 tablet 3  . oxyCODONE (OXY IR/ROXICODONE) 5 MG immediate release tablet Take 1-2 tablets (5-10 mg total) by mouth every 3 (three) hours as needed for severe pain. 30 tablet 0  . potassium chloride SA (K-DUR,KLOR-CON) 20 MEQ tablet Take 1 tablet (20 mEq total) by mouth daily. For 14 days 14 tablet 0  . simvastatin (ZOCOR) 20 MG tablet Take 1 tablet by mouth daily.     No current facility-administered medications for this visit.     Allergies:   Review of patient's allergies indicates no known allergies.    Social History:  The patient  reports that he has quit smoking. He does not have any smokeless tobacco history on file. He reports that he does not drink alcohol.   Family History:  The patient's family history includes CAD in his  mother.    ROS:  General:no colds or fevers, + weight loss Skin:no rashes or ulcers HEENT:no blurred vision, no congestion CV:see HPI PUL:see HPI GI:no diarrhea constipation or melena, no indigestion GU:no hematuria, no dysuria MS:no joint pain, no claudication Neuro:no syncope, + lightheadedness with bending over Endo:no diabetes, no thyroid disease  Wt Readings from Last 3 Encounters:  04/25/16 269 lb 12.8 oz (122.4 kg)  04/16/16 283 lb 8 oz (128.6 kg)     PHYSICAL EXAM: VS:  BP 120/60 (BP Location: Right Arm)   Pulse 80   Ht 6' (1.829 m)   Wt 269 lb 12.8 oz (122.4 kg)   BMI 36.59 kg/m  , BMI Body mass index is 36.59 kg/m. General:Pleasant affect, NAD Skin:Warm and dry, brisk capillary refill HEENT:normocephalic, sclera clear, mucus membranes moist Neck:supple, no JVD, no bruits , central line insertion site healing  Heart:S1S2 RRR without murmur, gallup, rub or click, chest incision is healing and chest tube sites healing. + bruising at site, old. Lungs: with few rales in bases, no rhonchi, or wheezes [id number], non tender, + BS, do not palpate liver spleen or masses Ext:no lower ext edema, 2+ pedal pulses, 2+ radial pulses Neuro:alert and oriented X 3, MAE, follows commands, + facial symmetry    EKG:  EKG is ordered today. The ekg ordered today demonstrates SR Qtc 484 but improved from hospital. sm Q waves inf. Leads.    Recent Labs: 04/07/2016: ALT 240; Magnesium 2.2 04/12/2016: BUN 16; Creatinine, Ser 0.99; Hemoglobin 9.1; Platelets 530; Potassium 4.7; Sodium 134    Lipid Panel No results found for: CHOL, TRIG, HDL, CHOLHDL, VLDL, LDLCALC, LDLDIRECT     Other studies Reviewed: Additional studies/ records that were reviewed today include: . Echo: 03/31/16  Study Conclusions  - Left ventricle: The cavity size was normal. There was mild   concentric hypertrophy. Systolic function was normal. The   estimated ejection fraction was in the range of 55% to 60%.  Wall   motion was normal; there were no regional wall motion   abnormalities. - Aortic valve: There was moderate regurgitation directed centrally   in the LVOT. - Left atrium: The atrium was mildly dilated.   Echo 04/07/16: Study Conclusions  - Left ventricle: The cavity size was normal. Systolic function was   normal. The estimated ejection fraction was in the range of 55%   to 60%. - Aortic valve: There was moderate regurgitation. - Aortic root: The aortic root was mildly dilated. - Pericardium, extracardiac: There was a left pleural effusion.   03/31/16 OR Report  PRE-OPERATIVE DIAGNOSIS:  AORTIC DISSECTION  POST-OPERATIVE DIAGNOSIS:  AORTIC DISSECTION  PROCEDURE:  Procedure(s): REPLACEMENT OF ASCENDING AORTA USING A 12mm HEMASHIELD PLATINUM VASCULAR GRAFT (N/A)  Antegrade cerebral perfusion Hypothermic circulatory arrest  03/31/16 CTA IMPRESSION: 1. Type A thoracic aortic dissection confirmed, with extension into the pelvis apparently terminating at the level of the aortic bifurcation. The dissection flap originates at the level of the aortic root with possible compromise of the aortic valve which could place the patient at risk for acute aortic regurgitation, and impingement upon the ostium of the right coronary artery, and to a lesser degree the left coronary artery ostium. The dissection flap extends into the proximal left subclavian artery, and also extends into the left renal artery. 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 As previously indicated, emergent Cardiothoracic Surgery consultation is strongly recommended. 2. Additional incidental findings, as above. These results were called by telephone at the time of interpretation on 03/31/2016 at 5:05 pm to Dr. Rhodia Albright, who verbally acknowledged these results.  ASSESSMENT AND PLAN:  1.  HTN currently controlled  Follow up with Dr. Sallyanne Kuster in 6-8 weeks  2.   ASCENDING AORTA rupture with surgery USING A 71mm HEMASHIELD PLATINUM VASCULAR GRAFT (N/A)   3. HLD continue statin, recheck lipids in 6 weeks with MD visit  4. Acute blood loss anemia will recheck CBC today  5. Tobacco use has stopped  6. Obesity continues to lose wt  7. Lt pl. Effusion with thoracentesis X 2. + rales in bases will leave on lasix for now and check BMP.   He will see Dr. Prescott Gum 05/16/16.   May need stress test at some point ? RCA involvement. But currently no symptoms.    Current medicines are reviewed with the patient today.  The patient Has no concerns regarding medicines.  The following changes have been made:  See above Labs/ tests ordered today include:see above  Disposition:   FU:  see above  Signed, Cecilie Kicks, NP  04/25/2016 12:38 PM    [address], [place], [place], [place] Phone: [phone]; Fax: [phone]

## 2016-04-26 ENCOUNTER — Other Ambulatory Visit: Payer: Self-pay | Admitting: Physician Assistant

## 2016-04-26 DIAGNOSIS — I71019 Dissection of thoracic aorta, unspecified: Secondary | ICD-10-CM

## 2016-04-26 DIAGNOSIS — D62 Acute posthemorrhagic anemia: Secondary | ICD-10-CM

## 2016-04-26 DIAGNOSIS — Z9889 Other specified postprocedural states: Secondary | ICD-10-CM

## 2016-04-26 DIAGNOSIS — I7101 Dissection of thoracic aorta: Secondary | ICD-10-CM

## 2016-04-27 ENCOUNTER — Other Ambulatory Visit: Payer: Self-pay | Admitting: Cardiology

## 2016-04-27 ENCOUNTER — Telehealth: Payer: Self-pay | Admitting: Cardiovascular Disease

## 2016-04-27 DIAGNOSIS — D62 Acute posthemorrhagic anemia: Secondary | ICD-10-CM

## 2016-04-27 DIAGNOSIS — I7101 Dissection of thoracic aorta: Secondary | ICD-10-CM

## 2016-04-27 DIAGNOSIS — I71019 Dissection of thoracic aorta, unspecified: Secondary | ICD-10-CM

## 2016-04-27 DIAGNOSIS — Z9889 Other specified postprocedural states: Secondary | ICD-10-CM

## 2016-04-27 NOTE — Telephone Encounter (Signed)
New message     Pt c/o medication issue:  1. Name of Medication: Potassium  2. How are you currently taking this medication (dosage and times per day)? 20 meq  3. Are you having a reaction (difficulty breathing--STAT)? no  4. What is your medication issue? The script is written for 30 on e-script , then it says take it for 14 days, the pharmacy needs clarification

## 2016-04-27 NOTE — Telephone Encounter (Signed)
Rx(s) sent to pharmacy electronically.  

## 2016-04-27 NOTE — Telephone Encounter (Signed)
Clarification given to [facility] regarding new med change (every other day use) - see lab result encounter for 04/25/16. Pharm tech acknowledged information given, aware pt has already received instructions.

## 2016-05-01 ENCOUNTER — Encounter (HOSPITAL_COMMUNITY): Payer: Self-pay | Admitting: Cardiothoracic Surgery

## 2016-05-03 ENCOUNTER — Encounter: Payer: Self-pay | Admitting: Cardiology

## 2016-05-03 ENCOUNTER — Encounter: Payer: Self-pay | Admitting: Cardiothoracic Surgery

## 2016-05-04 ENCOUNTER — Other Ambulatory Visit: Payer: Self-pay | Admitting: *Deleted

## 2016-05-04 DIAGNOSIS — G8918 Other acute postprocedural pain: Secondary | ICD-10-CM

## 2016-05-04 MED ORDER — TRAMADOL HCL 50 MG PO TABS: 50.0000 mg | ORAL_TABLET | Freq: Four times a day (QID) | ORAL | 0 refills | Status: DC | PRN

## 2016-05-04 NOTE — Telephone Encounter (Signed)
Jon Rodriguez has called for a refill for pain s/p REPLACEMENT OF ASCENDING AORTA on 04/10/16. He wishes to get a less strong med than Oxycodone, but stronger than plain Tylenol. I suggested Tramadol and noted that he had received it as an inpatient.  He agreed. I said I would fax a new script to his pharmacy after one of our doctors approved it.

## 2016-05-15 ENCOUNTER — Other Ambulatory Visit: Payer: Self-pay | Admitting: Cardiothoracic Surgery

## 2016-05-15 DIAGNOSIS — I7100 Dissection of unspecified site of aorta: Secondary | ICD-10-CM

## 2016-05-16 ENCOUNTER — Ambulatory Visit
Admission: RE | Admit: 2016-05-16 | Discharge: 2016-05-16 | Disposition: A | Discharge status: Home / Self Care | Payer: 59 | Source: Ambulatory Visit | Attending: Cardiothoracic Surgery | Admitting: Cardiothoracic Surgery

## 2016-05-16 ENCOUNTER — Encounter: Payer: Self-pay | Admitting: Cardiothoracic Surgery

## 2016-05-16 ENCOUNTER — Other Ambulatory Visit: Payer: Self-pay | Admitting: *Deleted

## 2016-05-16 ENCOUNTER — Ambulatory Visit (INDEPENDENT_AMBULATORY_CARE_PROVIDER_SITE_OTHER): Payer: Self-pay | Admitting: Cardiothoracic Surgery

## 2016-05-16 VITALS — BP 143/76 | HR 96 | Resp 16 | Ht 74.0 in | Wt 262.0 lb

## 2016-05-16 DIAGNOSIS — D62 Acute posthemorrhagic anemia: Secondary | ICD-10-CM

## 2016-05-16 DIAGNOSIS — I10 Essential (primary) hypertension: Secondary | ICD-10-CM

## 2016-05-16 DIAGNOSIS — I7101 Dissection of thoracic aorta: Secondary | ICD-10-CM

## 2016-05-16 DIAGNOSIS — Z09 Encounter for follow-up examination after completed treatment for conditions other than malignant neoplasm: Secondary | ICD-10-CM

## 2016-05-16 DIAGNOSIS — I7100 Dissection of unspecified site of aorta: Secondary | ICD-10-CM

## 2016-05-16 DIAGNOSIS — I71019 Dissection of thoracic aorta, unspecified: Secondary | ICD-10-CM

## 2016-05-16 DIAGNOSIS — Z9889 Other specified postprocedural states: Secondary | ICD-10-CM

## 2016-05-16 MED ORDER — METOPROLOL TARTRATE 25 MG PO TABS: 25.0000 mg | ORAL_TABLET | Freq: Two times a day (BID) | ORAL | 3 refills | Status: DC

## 2016-05-16 MED ORDER — AMLODIPINE BESYLATE 5 MG PO TABS: 5.0000 mg | ORAL_TABLET | Freq: Every day | ORAL | 3 refills | Status: DC

## 2016-05-16 MED ORDER — LOSARTAN POTASSIUM 25 MG PO TABS: 50.0000 mg | ORAL_TABLET | Freq: Every day | ORAL | 5 refills | Status: DC

## 2016-05-16 NOTE — Progress Notes (Signed)
PCP is No primary care provider on file. Referring Provider is Lacretia Leigh, MD  Chief Complaint  Patient presents with  . Routine Post Op    f/u s/p Repair of Type A  Aortic Dissection 03/31/16 with a CXR    [id number] postoperative visit 6 weeks after emergency repair of ascending aortic type A dissection with a hema-shield straight graft and resuspension of the aortic aortic valve. The patient required intubation for 5 days due to ARDS and underlying COPD but has since progressed. He is walking almost a mile 4 out of every 6 days and improving strength and exercise tolerance. Surgical incisions are well-healed. He is no longer requiring pain meds. He is slowly losing some of his preoperative obese weight. He is planning on starting outpatient cardiac rehabilitation the next 2 weeks. He is ready to start driving and may lift up to 15-20 pounds maximum. He remains on medications for blood pressure including Norvasc 5 mg daily metoprolol 25 mg twice a day, and lisinopril 20 mg twice a day. He is noticed a dry cough which is symptomatic and we will stop his lisinopril and start losartan.  Past Medical History:  Diagnosis Date  . High cholesterol   . Hypertension     Past Surgical History:  Procedure Laterality Date  . REPLACEMENT ASCENDING AORTA N/A 03/31/2016   Procedure: REPLACEMENT OF ASCENDING AORTA USING A 89mm HEMASHIELD PLATINUM VASCULAR GRAFT;  Surgeon: Ivin Poot, MD;  Location: [facility];  Service: Open Heart Surgery;  Laterality: N/A;    Family History  Problem Relation Age of Onset  . Hypertension      SIblings  . CAD Mother     CABG    Social History Social History  Substance Use Topics  . Smoking status: Former Research scientist (life sciences)  . Smokeless tobacco: Not on file  . Alcohol use No    Current Outpatient Prescriptions  Medication Sig Dispense Refill  . acetaminophen (TYLENOL) 325 MG tablet Take 2 tablets (650 mg total) by mouth every 6 (six) hours as needed for mild pain or  fever.    Marland Kitchen amLODipine (NORVASC) 5 MG tablet Take 1 tablet (5 mg total) by mouth daily. 30 tablet 3  . aspirin 325 MG EC tablet Take 325 mg by mouth daily.    . furosemide (LASIX) 40 MG tablet take 1 tablet by mouth once daily 30 tablet 1  . metoprolol tartrate (LOPRESSOR) 25 MG tablet Take 1 tablet (25 mg total) by mouth 2 (two) times daily. 60 tablet 3  . potassium chloride SA (K-DUR,KLOR-CON) 20 MEQ tablet take 1 tablet by mouth once daily for 14 days 30 tablet 1  . simvastatin (ZOCOR) 20 MG tablet Take 1 tablet by mouth daily.    . traMADol (ULTRAM) 50 MG tablet Take 1 tablet (50 mg total) by mouth every 6 (six) hours as needed. 40 tablet 0  . losartan (COZAAR) 25 MG tablet Take 2 tablets (50 mg total) by mouth daily. 30 tablet 5   No current facility-administered medications for this visit.     No Known Allergies  Review of Systems  No fever or shortness of breath or presyncope or ankle edema  BP (!) 143/76   Pulse 96   Resp 16   Ht 6\' 2"  (1.88 m)   Wt 262 lb (118.8 kg)   SpO2 96% Comment: ON RA  BMI 33.64 kg/m  Physical Exam      Exam    General- alert and comfortable  Lungs- clear without rales, wheezes   Cor- regular rate and rhythm, no murmur , gallop   Abdomen- soft, non-tender   Extremities - warm, non-tender, minimal edema   Neuro- oriented, appropriate, no focal weakness   Diagnostic Tests: Chest x-ray with decreased left pleural effusion, mild elevation of left hemidiaphragm stable  Impression: Excellent early recovery after emergency repair of type A dissection Continue antihypertensive medications and current walking program, start cardiac rehabilitation as scheduled Plan: Return with chest x-ray in early October to discuss return to work   Len Childs, MD [facility] [phone]

## 2016-05-24 ENCOUNTER — Other Ambulatory Visit: Payer: Self-pay | Admitting: Surgery

## 2016-05-24 ENCOUNTER — Other Ambulatory Visit: Payer: Self-pay | Admitting: *Deleted

## 2016-05-24 DIAGNOSIS — G8918 Other acute postprocedural pain: Secondary | ICD-10-CM

## 2016-05-24 MED ORDER — TRAMADOL HCL 50 MG PO TABS: 50.0000 mg | ORAL_TABLET | Freq: Four times a day (QID) | ORAL | 0 refills | Status: DC | PRN

## 2016-05-24 NOTE — Telephone Encounter (Signed)
A message was received from his preferred pharmacy for a refill for Tramadol., last filled on 05/04/16.  I had Dr. Prescott Gum sign a new script and faxed it to his pharmacy.

## 2016-06-01 ENCOUNTER — Encounter: Payer: Self-pay | Admitting: Cardiothoracic Surgery

## 2016-06-04 ENCOUNTER — Encounter: Payer: Self-pay | Admitting: Cardiovascular Disease

## 2016-06-04 ENCOUNTER — Ambulatory Visit (INDEPENDENT_AMBULATORY_CARE_PROVIDER_SITE_OTHER): Payer: 59 | Admitting: Cardiovascular Disease

## 2016-06-04 VITALS — BP 146/80 | HR 79 | Ht 74.0 in | Wt 263.8 lb

## 2016-06-04 DIAGNOSIS — E785 Hyperlipidemia, unspecified: Secondary | ICD-10-CM

## 2016-06-04 DIAGNOSIS — I10 Essential (primary) hypertension: Secondary | ICD-10-CM

## 2016-06-04 DIAGNOSIS — I351 Nonrheumatic aortic (valve) insufficiency: Secondary | ICD-10-CM

## 2016-06-04 DIAGNOSIS — Z79899 Other long term (current) drug therapy: Secondary | ICD-10-CM

## 2016-06-04 MED ORDER — LOSARTAN POTASSIUM 100 MG PO TABS: 100.0000 mg | ORAL_TABLET | Freq: Every day | ORAL | 11 refills | Status: DC

## 2016-06-04 MED ORDER — LOSARTAN POTASSIUM 25 MG PO TABS: 50.0000 mg | ORAL_TABLET | Freq: Every day | ORAL | 5 refills | Status: DC

## 2016-06-04 NOTE — Addendum Note (Signed)
Addended by: Diana Eves on: 06/04/2016 11:15 AM   Modules accepted: Orders

## 2016-06-04 NOTE — Patient Instructions (Signed)
Medication Instructions: Dr Sallyanne Kuster has recommended making the following medication changes: 1. INCREASE Losartan to 100 mg daily  Labwork: NONE ORDERED  Testing/Procedures: 1. Echocardiogram - Your physician has requested that you have an echocardiogram. Echocardiography is a painless test that uses sound waves to create images of your heart. It provides your doctor with information about the size and shape of your heart and how well your heart's chambers and valves are working. This procedure takes approximately one hour. There are no restrictions for this procedure. This will be performed at our [facility] location - [address], [address].  Follow-up: Dr Sallyanne Kuster recommends that you schedule a follow-up appointment in 4-6 weeks.  If you need a refill on your cardiac medications before your next appointment, please call your pharmacy.

## 2016-06-04 NOTE — Progress Notes (Signed)
Cardiology Office Note    Date:  06/04/2016   ID:  Jon Rodriguez, DOB 07/13/1961, MRN [id number]  PCP:  Harle Battiest, MD  Cardiologist:   Sanda Klein, MD   No chief complaint on file.   History of Present Illness:  Jon Rodriguez is a 55 y.o. 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 He required transient postoperative intra-aortic balloon pump support for postoperative RV dysfunction.  Both his preoperative echo on July 8 and the postop echo on July 15 showed normal left ventricular systolic function and wall motion, EF 55-60% and moderate aortic valve insufficiency. Discharge hemoglobin on July 20 was 9.1, recheck on August 2 was 10.5. He is taking an iron supplement. He had a couple of left thoracenteses for pleural effusion performed on July 17 and July 22. Chest x-ray was performed on August 23 and showed "small right pleural effusion, ...larger left pleural effusion ... has decreased in size". Did not show any evidence of pulmonary edema  He has recovered well from his surgery but has not yet started cardiac rehabilitation, although the rehabilitation nurses have been in touch with him. He is already walking on his home treadmill at 1.7 miles per hour for 26 minutes daily. He had a rough patch about 2 weeks ago where he was only able to walk for 6 minutes at a time before becoming stream is short of breath and hyperventilating. This lasted for 3 or 4 days and then resolved. He does not recall having any problems with chest pain, edema or palpitations during that time. He does not recall there being appeared of excessive sodium intake or other intercurrent illnesses.  Denies bleeding, leg edema, headaches, chest discomfort, fever or chills, cough or hemoptysis. He does complain about  stinging tenderness overlying his surgical scars, which both have some keloid formation.  Past Medical History:  Diagnosis Date  . High cholesterol   . Hypertension     Past Surgical History:  Procedure Laterality Date  . REPLACEMENT ASCENDING AORTA N/A 03/31/2016   Procedure: REPLACEMENT OF ASCENDING AORTA USING A 11mm HEMASHIELD PLATINUM VASCULAR GRAFT;  Surgeon: Ivin Poot, MD;  Location: [facility];  Service: Open Heart Surgery;  Laterality: N/A;    Current Medications: Outpatient Medications Prior to Visit  Medication Sig Dispense Refill  . acetaminophen (TYLENOL) 325 MG tablet Take 2 tablets (650 mg total) by mouth every 6 (six) hours as needed for mild pain or fever.    Marland Kitchen amLODipine (NORVASC) 5 MG tablet Take 1 tablet (5 mg total) by mouth daily. 30 tablet 3  . aspirin 325 MG EC tablet Take 325 mg by mouth daily.    . metoprolol tartrate (LOPRESSOR) 25 MG tablet Take 1 tablet (25 mg total) by mouth 2 (two) times daily. 60 tablet 3  . simvastatin (ZOCOR) 20 MG tablet Take 1 tablet by mouth daily.    Marland Kitchen losartan (COZAAR) 25 MG tablet Take 2 tablets (50 mg total) by mouth daily. 60 tablet 5  . furosemide (LASIX) 40 MG tablet take 1 tablet by mouth once daily (Patient not taking: Reported on 06/04/2016) 30 tablet 1  . potassium chloride SA (K-DUR,KLOR-CON) 20 MEQ tablet take 1 tablet by mouth once daily for 14 days (Patient not taking: Reported on 06/04/2016) 30 tablet 1  .  traMADol (ULTRAM) 50 MG tablet Take 1 tablet (50 mg total) by mouth every 6 (six) hours as needed. (Patient not taking: Reported on 06/04/2016) 40 tablet 0   No facility-administered medications prior to visit.      Allergies:   Review of patient's allergies indicates no known allergies.   Social History   Social History  . Marital status: Divorced    Spouse name: N/A  . Number of children: N/A  . Years of education: N/A   Social History Main Topics  . Smoking status: Former Research scientist (life sciences)  . Smokeless tobacco:  Former Systems developer    Types: Chew  . Alcohol use No  . Drug use: Unknown  . Sexual activity: Not Asked   Other Topics Concern  . None   Social History Narrative  . None     Family History:  The patient's family history includes CAD in his mother; Hyperlipidemia in his father and mother; Hypertension in his father; Prostate cancer in his father.   ROS:   Please see the history of present illness.    ROS All other systems reviewed and are negative.   PHYSICAL EXAM:   VS:  BP (!) 146/80   Pulse 79   Ht 6\' 2"  (1.88 m)   Wt 263 lb 12.8 oz (119.7 kg)   BMI 33.87 kg/m    GEN: Well nourished, well developed, in no acute distress  HEENT: normal  Neck: no JVD, carotid bruits, or masses Cardiac: RRR; 3/6 decrescendo diastolic murmur at the left lower sternal border, no rubs, or gallops,no edema  Respiratory:  clear to auscultation bilaterally, normal work of breathing. Some keloid formation on the sternotomy scar but especially at the right subclavian bypass axis scar GI: soft, nontender, nondistended, + BS MS: no deformity or atrophy  Skin: warm and dry, no rash Neuro:  Alert and Oriented x 3, Strength and sensation are intact Psych: euthymic mood, full affect  Wt Readings from Last 3 Encounters:  06/04/16 263 lb 12.8 oz (119.7 kg)  05/16/16 262 lb (118.8 kg)  04/25/16 269 lb 12.8 oz (122.4 kg)      Studies/Labs Reviewed:   EKG:  EKG is not ordered today.  The ekg ordered August 2 demonstrates sinus rhythm and prolonged QT, improving from previous EKG.  Recent Labs: 04/07/2016: ALT 240; Magnesium 2.2 04/25/2016: BUN 13; Creat 1.02; Hemoglobin 10.5; Platelets 557; Potassium 5.0; Sodium 140     ASSESSMENT:    1. Aortic insufficiency   2. Essential hypertension      PLAN:  In order of problems listed above:  1. Dyspnea: I'm not sure whether his episode of dyspnea that persisted for 3 or 4 days was an episode of congestive heart failure, and if so whether it was related to a  transient arrhythmia such as postoperative atrial fibrillation. Symptoms have subsequently resolved and he is now able to exercise on his treadmill for almost half an hour daily. Check an echocardiogram. 2. AI: Reevaluate by echo. Very distinct murmur on exam.  3. S/P Asc Ao Dissection: Now about 2 months after surgical repair. No evidence of limb or end organ ischemia by history and exam. 4. HTN: Blood pressure is unacceptably high considering his recent dissection. Just in case his symptoms of dyspnea related to transient heart failure will avoid increasing the beta blocker for now but will double the dose of losartan. Target blood pressure 120s/70s or less. 5. Anemia: Recheck hemoglobin. 6. HLP: Recheck lipid profile. 7. Obesity: In the long  run, weight loss is critical to prevent future complications.    Medication Adjustments/Labs and Tests Ordered: Current medicines are reviewed at length with the patient today.  Concerns regarding medicines are outlined above.  Medication changes, Labs and Tests ordered today are listed in the Patient Instructions below. Patient Instructions  Medication Instructions: Dr Sallyanne Kuster has recommended making the following medication changes: 1. INCREASE Losartan to 100 mg daily  Labwork: NONE ORDERED  Testing/Procedures: 1. Echocardiogram - Your physician has requested that you have an echocardiogram. Echocardiography is a painless test that uses sound waves to create images of your heart. It provides your doctor with information about the size and shape of your heart and how well your heart's chambers and valves are working. This procedure takes approximately one hour. There are no restrictions for this procedure. This will be performed at our [facility] location - [address].  Follow-up: Dr Sallyanne Kuster recommends that you schedule a follow-up appointment in 4-6 weeks.  If you need a refill on your cardiac medications before your next  appointment, please call your pharmacy.    Signed, Sanda Klein, MD  06/04/2016 9:52 AM    [facility] [place], [place], [place]  [address] Phone: [phone]; Fax: [phone]

## 2016-06-12 LAB — CBC
HCT: 37.1 % — ABNORMAL LOW (ref 38.5–50.0)
HEMOGLOBIN: 11.9 g/dL — AB (ref 13.2–17.1)
MCH: 28.9 pg (ref 27.0–33.0)
MCHC: 32.1 g/dL (ref 32.0–36.0)
MCV: 90 fL (ref 80.0–100.0)
MPV: 10.2 fL (ref 7.5–12.5)
PLATELETS: 399 10*3/uL (ref 140–400)
RBC: 4.12 MIL/uL — ABNORMAL LOW (ref 4.20–5.80)
RDW: 15.1 % — AB (ref 11.0–15.0)
WBC: 7.1 10*3/uL (ref 3.8–10.8)

## 2016-06-12 LAB — COMPREHENSIVE METABOLIC PANEL
ALBUMIN: 3.6 g/dL (ref 3.6–5.1)
ALT: 16 U/L (ref 9–46)
AST: 13 U/L (ref 10–35)
Alkaline Phosphatase: 66 U/L (ref 40–115)
BUN: 16 mg/dL (ref 7–25)
CHLORIDE: 106 mmol/L (ref 98–110)
CO2: 25 mmol/L (ref 20–31)
CREATININE: 0.75 mg/dL (ref 0.70–1.33)
Calcium: 9.3 mg/dL (ref 8.6–10.3)
Glucose, Bld: 100 mg/dL — ABNORMAL HIGH (ref 65–99)
POTASSIUM: 4.3 mmol/L (ref 3.5–5.3)
SODIUM: 139 mmol/L (ref 135–146)
Total Bilirubin: 0.4 mg/dL (ref 0.2–1.2)
Total Protein: 6.9 g/dL (ref 6.1–8.1)

## 2016-06-12 LAB — LIPID PANEL
CHOL/HDL RATIO: 3.3 ratio (ref <=5.0)
CHOLESTEROL: 170 mg/dL (ref 125–200)
HDL: 51 mg/dL (ref >=40)
LDL CALC: 103 mg/dL (ref <130)
TRIGLYCERIDES: 82 mg/dL (ref <150)
VLDL: 16 mg/dL (ref <30)

## 2016-06-18 ENCOUNTER — Other Ambulatory Visit: Payer: Self-pay

## 2016-06-18 ENCOUNTER — Ambulatory Visit (HOSPITAL_COMMUNITY): Payer: 59 | Attending: Cardiovascular Disease

## 2016-06-18 ENCOUNTER — Encounter: Payer: Self-pay | Admitting: Cardiovascular Disease

## 2016-06-18 DIAGNOSIS — E785 Hyperlipidemia, unspecified: Secondary | ICD-10-CM | POA: Insufficient documentation

## 2016-06-18 DIAGNOSIS — I119 Hypertensive heart disease without heart failure: Secondary | ICD-10-CM | POA: Diagnosis not present

## 2016-06-18 DIAGNOSIS — I351 Nonrheumatic aortic (valve) insufficiency: Secondary | ICD-10-CM | POA: Insufficient documentation

## 2016-06-18 DIAGNOSIS — I359 Nonrheumatic aortic valve disorder, unspecified: Secondary | ICD-10-CM | POA: Diagnosis present

## 2016-06-27 ENCOUNTER — Encounter: Payer: 59 | Admitting: Cardiothoracic Surgery

## 2016-06-28 ENCOUNTER — Encounter (HOSPITAL_COMMUNITY)
Admission: RE | Admit: 2016-06-28 | Discharge: 2016-06-28 | Disposition: A | Discharge status: Home / Self Care | Payer: 59 | Source: Ambulatory Visit | Attending: Cardiovascular Disease | Admitting: Cardiovascular Disease

## 2016-06-28 ENCOUNTER — Other Ambulatory Visit: Payer: Self-pay | Admitting: Cardiothoracic Surgery

## 2016-06-28 VITALS — BP 128/76 | HR 87 | Ht 73.0 in | Wt 264.3 lb

## 2016-06-28 DIAGNOSIS — I7101 Dissection of thoracic aorta: Secondary | ICD-10-CM

## 2016-06-28 DIAGNOSIS — I713 Abdominal aortic aneurysm, ruptured: Secondary | ICD-10-CM | POA: Diagnosis not present

## 2016-06-28 DIAGNOSIS — I71019 Dissection of thoracic aorta, unspecified: Secondary | ICD-10-CM

## 2016-06-28 DIAGNOSIS — I7100 Dissection of unspecified site of aorta: Secondary | ICD-10-CM | POA: Insufficient documentation

## 2016-06-28 NOTE — Progress Notes (Signed)
Cardiac Individual Treatment Plan  Patient Details  Name: Jon Rodriguez MRN: [id number] Date of Birth: 08-20-1961 Referring Provider:   Flowsheet Row CARDIAC REHAB PHASE II ORIENTATION from 06/28/2016 in [place]  Referring Provider  Croitoru, Mihai MD      Initial Encounter Date:  [place] from 06/28/2016 in [place]  Date  06/28/16  Referring Provider  Croitoru, Mihai MD      Visit Diagnosis: Dissection of aorta, unspecified portion of aorta ([place])  Patient's Home Medications on Admission:  Current Outpatient Prescriptions:  .  acetaminophen (TYLENOL) 325 MG tablet, Take 2 tablets (650 mg total) by mouth every 6 (six) hours as needed for mild pain or fever., Disp: , Rfl:  .  amLODipine (NORVASC) 5 MG tablet, Take 1 tablet (5 mg total) by mouth daily. (Patient taking differently: Take 10 mg by mouth daily. ), Disp: 30 tablet, Rfl: 3 .  aspirin 325 MG EC tablet, Take 325 mg by mouth daily., Disp: , Rfl:  .  co-enzyme Q-10 50 MG capsule, Take 50 mg by mouth daily., Disp: , Rfl:  .  glucosamine-chondroitin 500-400 MG tablet, Take 2 tablets by mouth every morning., Disp: , Rfl:  .  losartan (COZAAR) 100 MG tablet, Take 1 tablet (100 mg total) by mouth daily., Disp: 30 tablet, Rfl: 11 .  metoprolol tartrate (LOPRESSOR) 25 MG tablet, Take 1 tablet (25 mg total) by mouth 2 (two) times daily., Disp: 60 tablet, Rfl: 3 .  Multiple Vitamins-Minerals (MULTIVITAMIN WITH MINERALS) tablet, Take 1 tablet by mouth daily., Disp: , Rfl:  .  simvastatin (ZOCOR) 20 MG tablet, Take 1 tablet by mouth daily., Disp: , Rfl:   Past Medical History: Past Medical History:  Diagnosis Date  . High cholesterol   . Hypertension     Tobacco Use: History  Smoking Status  . Former Smoker  Smokeless Tobacco  . Former Systems developer  . Types: Chew    Labs: Recent Review Flowsheet Data    Labs for ITP Cardiac  and Pulmonary Rehab Latest Ref Rng & Units 04/06/2016 04/06/2016 04/07/2016 04/07/2016 06/11/2016   Cholestrol 125 - 200 mg/dL - - - - 170   LDLCALC <130 mg/dL - - - - 103   HDL >=40 mg/dL - - - - 51   Trlycerides <150 mg/dL - - - - 82   Hemoglobin A1c 4.8 - 5.6 % - - - - -   PHART 7.350 - 7.450 - 7.455(H) 7.470(H) - -   PCO2ART 35.0 - 45.0 mmHg - 56.3(H) 53.9(H) - -   HCO3 20.0 - 24.0 mEq/L - 38.5(H) 38.4(H) - -   TCO2 0 - 100 mmol/L 35 40.2 40.0 - -   ACIDBASEDEF 0.0 - 2.0 mmol/L - - - - -   O2SAT % - 92.8 89.4 59.1 -      Capillary Blood Glucose: Lab Results  Component Value Date   GLUCAP 110 (H) 04/09/2016   GLUCAP 125 (H) 04/09/2016   GLUCAP 114 (H) 04/09/2016   GLUCAP 112 (H) 04/09/2016   GLUCAP 115 (H) 04/08/2016     Exercise Target Goals: Date: 06/28/16  Exercise Program Goal: Individual exercise prescription set with THRR, safety & activity barriers. Participant demonstrates ability to understand and report RPE using BORG scale, to self-measure pulse accurately, and to acknowledge the importance of the exercise prescription.  Exercise Prescription Goal: Starting with aerobic activity 30 plus minutes a day, 3 days  per week for initial exercise prescription. Provide home exercise prescription and guidelines that participant acknowledges understanding prior to discharge.  Activity Barriers & Risk Stratification:     Activity Barriers & Cardiac Risk Stratification - 06/28/16 1404      Activity Barriers & Cardiac Risk Stratification   Activity Barriers None      6 Minute Walk:     6 Minute Walk    Row Name 06/28/16 1406         6 Minute Walk   Phase Initial     Distance 1508 feet     Walk Time 6 minutes     # of Rest Breaks 0     MPH 2.85     METS 3.87     RPE 12     VO2 Peak 13.5     Symptoms No     Resting HR 87 bpm     Resting BP 128/76     Max Ex. HR 102 bpm     Max Ex. BP 148/76     2 Minute Post BP 120/72        Initial Exercise  Prescription:     Initial Exercise Prescription - 06/28/16 1400      Date of Initial Exercise RX and Referring Provider   Date 06/28/16   Referring Provider Croitoru, Mihai MD     Treadmill   MPH 1.9   Grade 0   Minutes 10   METs 2.45     Bike   Level 0.8   Minutes 10   METs 2.26     NuStep   Level 3   Minutes 10   METs 2     Prescription Details   Frequency (times per week) 3   Duration Progress to 30 minutes of continuous aerobic without signs/symptoms of physical distress     Intensity   THRR 40-80% of Max Heartrate 66-132   Ratings of Perceived Exertion 11-13   Perceived Dyspnea 0-4     Progression   Progression Continue to progress workloads to maintain intensity without signs/symptoms of physical distress.     Resistance Training   Training Prescription Yes   Weight 2lbs   Reps 10-12      Perform Capillary Blood Glucose checks as needed.  Exercise Prescription Changes:   Exercise Comments:   Discharge Exercise Prescription (Final Exercise Prescription Changes):   Nutrition:  Target Goals: Understanding of nutrition guidelines, daily intake of sodium 1500mg , cholesterol 200mg , calories 30% from fat and 7% or less from saturated fats, daily to have 5 or more servings of fruits and vegetables.  Biometrics:     Pre Biometrics - 06/28/16 1414      Pre Biometrics   Waist Circumference 47.75 inches   Hip Circumference 45.75 inches   Waist to Hip Ratio 1.04 %   Triceps Skinfold 43 mm   % Body Fat 37.2 %   Grip Strength 39 kg   Flexibility 12.25 in   Single Leg Stand 26.67 seconds       Nutrition Therapy Plan and Nutrition Goals:   Nutrition Discharge: Nutrition Scores:     Nutrition Assessments - 06/28/16 1050      MEDFICTS Scores   Pre Score 52      Nutrition Goals Re-Evaluation:   Psychosocial: Target Goals: Acknowledge presence or absence of depression, maximize coping skills, provide positive support system. Participant  is able to verbalize types and ability to use techniques and skills needed for reducing stress and  depression.  Initial Review & Psychosocial Screening:     Initial Psych Review & Screening - 06/28/16 1453      Screening Interventions   Interventions Encouraged to exercise      Quality of Life Scores:     Quality of Life - 06/28/16 1415      Quality of Life Scores   Health/Function Pre 22.07 %   Socioeconomic Pre 22.36 %   Psych/Spiritual Pre 22.58 %   Family Pre 26.38 %   GLOBAL Pre 22.77 %      PHQ-9: Recent Review Flowsheet Data    There is no flowsheet data to display.      Psychosocial Evaluation and Intervention:   Psychosocial Re-Evaluation:   Vocational Rehabilitation: Provide vocational rehab assistance to qualifying candidates.   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      Education: Education Goals: Education classes will be provided on a weekly basis, covering required topics. Participant will state understanding/return demonstration of topics presented.  Learning Barriers/Preferences:     Learning Barriers/Preferences - 06/28/16 0836      Learning Barriers/Preferences   Learning Barriers Sight   Learning Preferences Pictoral;Skilled Demonstration;Written Material;Individual Instruction      Education Topics: Count Your Pulse:  -Group instruction provided by verbal instruction, demonstration, patient participation and written materials to support subject.  Instructors address importance of being able to find your pulse and how to count your pulse when at home without a heart monitor.  Patients get hands on experience counting their pulse with staff help and individually.   Heart  Attack, Angina, and Risk Factor Modification:  -Group instruction provided by verbal instruction, video, and written materials to support subject.  Instructors address signs and symptoms of angina and heart attacks.    Also discuss risk factors for heart disease and how to make changes to improve heart health risk factors.   Functional Fitness:  -Group instruction provided by verbal instruction, demonstration, patient participation, and written materials to support subject.  Instructors address safety measures for doing things around the house.  Discuss how to get up and down off the floor, how to pick things up properly, how to safely get out of a chair without assistance, and balance training.   Meditation and Mindfulness:  -Group instruction provided by verbal instruction, patient participation, and written materials to support subject.  Instructor addresses importance of mindfulness and meditation practice to help reduce stress and improve awareness.  Instructor also leads participants through a meditation exercise.    Stretching for Flexibility and Mobility:  -Group instruction provided by verbal instruction, patient participation, and written materials to support subject.  Instructors lead participants through series of stretches that are designed to increase flexibility thus improving mobility.  These stretches are additional exercise for major muscle groups that are typically performed during regular warm up and cool down.   Hands Only CPR Anytime:  -Group instruction provided by verbal instruction, video, patient participation and written materials to support subject.  Instructors co-teach with AHA video for hands only CPR.  Participants get hands on experience with mannequins.   Nutrition I class: Heart Healthy Eating:  -Group instruction provided by PowerPoint slides, verbal discussion, and written materials to support subject matter.  The instructor gives an explanation and review of  the Therapeutic Lifestyle Changes diet recommendations, which includes a discussion on lipid goals, dietary fat, sodium, fiber, plant stanol/sterol esters, sugar, and the components of a well-balanced, healthy diet.   Nutrition II class: Lifestyle Skills:  -Group instruction provided by PowerPoint slides, verbal discussion, and written materials to support subject matter. The instructor gives an explanation and review of label reading, grocery shopping for heart health, heart healthy recipe modifications, and ways to make healthier choices when eating out.   Diabetes Question & Answer:  -Group instruction provided by PowerPoint slides, verbal discussion, and written materials to support subject matter. The instructor gives an explanation and review of diabetes co-morbidities, pre- and post-prandial blood glucose goals, pre-exercise blood glucose goals, signs, symptoms, and treatment of hypoglycemia and hyperglycemia, and foot care basics.   Diabetes Blitz:  -Group instruction provided by PowerPoint slides, verbal discussion, and written materials to support subject matter. The instructor gives an explanation and review of the physiology behind type 1 and type 2 diabetes, diabetes medications and rational behind using different medications, pre- and post-prandial blood glucose recommendations and Hemoglobin A1c goals, diabetes diet, and exercise including blood glucose guidelines for exercising safely.    Portion Distortion:  -Group instruction provided by PowerPoint slides, verbal discussion, written materials, and food models to support subject matter. The instructor gives an explanation of serving size versus portion size, changes in portions sizes over the last 20 years, and what consists of a serving from each food group.   Stress Management:  -Group instruction provided by verbal instruction, video, and written materials to support subject matter.  Instructors review role of stress in heart  disease and how to cope with stress positively.     Exercising on Your Own:  -Group instruction provided by verbal instruction, power point, and written materials to support subject.  Instructors discuss benefits of exercise, components of exercise, frequency and intensity of exercise, and end points for exercise.  Also discuss use of nitroglycerin and activating EMS.  Review options of places to exercise outside of rehab.  Review guidelines for sex with heart disease.   Cardiac Drugs I:  -Group instruction provided by verbal instruction and written materials to support subject.  Instructor reviews cardiac drug classes: antiplatelets, anticoagulants, beta blockers, and statins.  Instructor discusses reasons, side effects, and lifestyle considerations for each drug class.   Cardiac Drugs II:  -Group instruction provided by verbal instruction and written materials to support subject.  Instructor reviews cardiac drug classes: angiotensin converting enzyme inhibitors (ACE-I), angiotensin II receptor blockers (ARBs), nitrates, and calcium channel blockers.  Instructor discusses reasons, side effects, and lifestyle considerations for each drug class.   Anatomy and Physiology of the Circulatory System:  -Group instruction provided by verbal instruction, video, and written materials to support subject.  Reviews functional anatomy of heart, how it relates to various diagnoses, and what role the heart plays in the overall system.   Knowledge Questionnaire Score:     Knowledge Questionnaire Score - 06/28/16 1405      Knowledge Questionnaire Score   Pre Score 24/24      Core Components/Risk Factors/Patient Goals at Admission:     Personal Goals and Risk Factors at Admission - 06/28/16 0836      Core Components/Risk Factors/Patient Goals on Admission    Weight Management Yes;Weight Maintenance;Weight Loss   Intervention Weight Management: Develop a combined nutrition and exercise program  designed to reach desired caloric intake, while maintaining  appropriate intake of nutrient and fiber, sodium and fats, and appropriate energy expenditure required for the weight goal.;Weight Management: Provide education and appropriate resources to help participant work on and attain dietary goals.;Weight Management/Obesity: Establish reasonable short term and long term weight goals.;Obesity: Provide education and appropriate resources to help participant work on and attain dietary goals.   Expected Outcomes Short Term: Continue to assess and modify interventions until short term weight is achieved;Long Term: Adherence to nutrition and physical activity/exercise program aimed toward attainment of established weight goal;Weight Maintenance: Understanding of the daily nutrition guidelines, which includes 25-35% calories from fat, 7% or less cal from saturated fats, less than 200mg  cholesterol, less than 1.5gm of sodium, & 5 or more servings of fruits and vegetables daily;Weight Loss: Understanding of general recommendations for a balanced deficit meal plan, which promotes 1-2 lb weight loss per week and includes a negative energy balance of [phone] kcal/d;Understanding of distribution of calorie intake throughout the day with the consumption of 4-5 meals/snacks;Understanding recommendations for meals to include 15-35% energy as protein, 25-35% energy from fat, 35-60% energy from carbohydrates, less than 200mg  of dietary cholesterol, 20-35 gm of total fiber daily   Increase Strength and Stamina Yes   Intervention Provide advice, education, support and counseling about physical activity/exercise needs.;Develop an individualized exercise prescription for aerobic and resistive training based on initial evaluation findings, risk stratification, comorbidities and participant's personal goals.   Expected Outcomes Achievement of increased cardiorespiratory fitness and enhanced flexibility, muscular endurance and  strength shown through measurements of functional capacity and personal statement of participant.   Improve shortness of breath with ADL's Yes   Intervention Provide education, individualized exercise plan and daily activity instruction to help decrease symptoms of SOB with activities of daily living.   Expected Outcomes Short Term: Achieves a reduction of symptoms when performing activities of daily living.   Personal Goal Other Yes   Personal Goal short: lose 8-10lbs . Get back to lifting weights. Long term: lose wt ~50lbs; back to weight lifting, return to running and improve balance/flexibity   Intervention Provide exercise programming that consist of weight training, balance and improving overall areobic capacity. Provide nutrition counseling to assist with HH diet and weightloss   Expected Outcomes Pt will return to running, lifting weights and lose weight without losing muscle tone.      Core Components/Risk Factors/Patient Goals Review:    Core Components/Risk Factors/Patient Goals at Discharge (Final Review):    ITP Comments:     ITP Comments    Row Name 06/28/16 0833           ITP Comments Dr. Loreta Ave, Medical Director          Comments: Pt in today for cardiac rehab orientation from 0800-1030.  As a part of the orientation appointment, pt completed 6 minute walk test.  Pt tolerated well with no complaints of cp or sob. Pt completed pre assessment questionnaires and demonstrates a positive and healthy outlook regarding his recovery. No psychosocial needs identified at this time.  Monitor showed SR with rare  PVC. Pt is eager to begin full exercise on next Monday. Cherre Huger, BSN

## 2016-06-28 NOTE — Progress Notes (Signed)
Cardiac Rehab Medication Review by a Pharmacist  Does the patient  feel that his/her medications are working for him/her?  yes  Has the patient been experiencing any side effects to the medications prescribed?  no  Does the patient measure his/her own blood pressure or blood glucose at home?  yes   Does the patient have any problems obtaining medications due to transportation or finances?   no  Understanding of regimen: good Understanding of indications: good Potential of compliance: good   Pharmacist comments: Mr. Jon Rodriguez is a 55 year old male who presents to cardiac rehab today in good spirits. He does not have any concerns for his current medications and seems to have a good handle on his regimen. He knows his blood pressure goal, monitors regularly, and has had a recent amlopidine dose increase under instruction of his provider. He has no major complaints at this time.   Demetrius Charity, PharmD Acute Care Pharmacy Resident  Pager: [phone] 06/28/2016

## 2016-06-29 ENCOUNTER — Ambulatory Visit
Admission: RE | Admit: 2016-06-29 | Discharge: 2016-06-29 | Disposition: A | Discharge status: Home / Self Care | Payer: 59 | Source: Ambulatory Visit | Attending: Cardiothoracic Surgery | Admitting: Cardiothoracic Surgery

## 2016-06-29 ENCOUNTER — Other Ambulatory Visit: Payer: Self-pay | Admitting: *Deleted

## 2016-06-29 ENCOUNTER — Encounter: Payer: Self-pay | Admitting: Cardiovascular Disease

## 2016-06-29 ENCOUNTER — Ambulatory Visit (INDEPENDENT_AMBULATORY_CARE_PROVIDER_SITE_OTHER): Payer: Self-pay | Admitting: Cardiothoracic Surgery

## 2016-06-29 ENCOUNTER — Encounter: Payer: Self-pay | Admitting: Cardiothoracic Surgery

## 2016-06-29 VITALS — BP 147/78 | HR 77 | Resp 16 | Ht 74.0 in | Wt 262.0 lb

## 2016-06-29 DIAGNOSIS — I7101 Dissection of thoracic aorta: Secondary | ICD-10-CM

## 2016-06-29 DIAGNOSIS — Z9889 Other specified postprocedural states: Secondary | ICD-10-CM

## 2016-06-29 DIAGNOSIS — I71019 Dissection of thoracic aorta, unspecified: Secondary | ICD-10-CM

## 2016-06-29 DIAGNOSIS — Z09 Encounter for follow-up examination after completed treatment for conditions other than malignant neoplasm: Secondary | ICD-10-CM

## 2016-06-29 DIAGNOSIS — D62 Acute posthemorrhagic anemia: Secondary | ICD-10-CM

## 2016-06-29 DIAGNOSIS — I10 Essential (primary) hypertension: Secondary | ICD-10-CM

## 2016-06-29 MED ORDER — AMLODIPINE BESYLATE 10 MG PO TABS: 10.0000 mg | ORAL_TABLET | Freq: Every day | ORAL | 5 refills | Status: DC

## 2016-06-29 MED ORDER — METOPROLOL TARTRATE 50 MG PO TABS: 50.0000 mg | ORAL_TABLET | Freq: Two times a day (BID) | ORAL | 5 refills | Status: DC

## 2016-06-29 NOTE — Progress Notes (Signed)
PCP is ROSS,ALLEN, MD Referring Provider is Harle Battiest, MD  Chief Complaint  Patient presents with  . 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 Patient had ARDS postoperatively and remained intubated for 5 days. He had a stop her left pleural effusion which has now resolved on his chest x-ray. The patient has significant hypertension and is now taking losartan, metoprolol, and amlodipine as directed by his cardiologist Dr. Sallyanne Kuster He still has limited exercise tolerance but this is improving. He will start cardiac rehabilitation next week. Surgical incisions are healing well. No edema or symptoms of CHF Echocardiogram approximately 2 weeks ago showed normal LV function with mild-moderate aortic insufficiency, no pericardial effusion  Past Medical History:  Diagnosis Date  . High cholesterol   . Hypertension     Past Surgical History:  Procedure Laterality Date  . REPLACEMENT ASCENDING AORTA N/A 03/31/2016   Procedure: REPLACEMENT OF ASCENDING AORTA USING A 20mm HEMASHIELD PLATINUM VASCULAR GRAFT;  Surgeon: Ivin Poot, MD;  Location: [facility];  Service: Open Heart Surgery;  Laterality: N/A;    Family History  Problem Relation Age of Onset  . Hypertension      SIblings  . CAD Mother     CABG  . Hyperlipidemia Mother   . Hypertension Father   . Prostate cancer Father   . Hyperlipidemia Father     Social History Social History  Substance Use Topics  . Smoking status: Former Research scientist (life sciences)  . Smokeless tobacco: Former Systems developer    Types: Chew  . Alcohol use No    Current Outpatient Prescriptions  Medication Sig Dispense Refill  . acetaminophen (TYLENOL) 325 MG tablet Take 2 tablets (650 mg total) by mouth every 6 (six) hours as needed for mild pain or  fever.    Marland Kitchen amLODipine (NORVASC) 10 MG tablet Take 1 tablet (10 mg total) by mouth daily. 30 tablet 5  . aspirin 325 MG EC tablet Take 325 mg by mouth daily.    Marland Kitchen co-enzyme Q-10 50 MG capsule Take 50 mg by mouth daily.    Marland Kitchen FERROUS SULFATE PO Take 45 mg by mouth daily.    Marland Kitchen glucosamine-chondroitin 500-400 MG tablet Take 2 tablets by mouth every morning.    Marland Kitchen losartan (COZAAR) 100 MG tablet Take 1 tablet (100 mg total) by mouth daily. 30 tablet 11  . metoprolol tartrate (LOPRESSOR) 50 MG tablet Take 1 tablet (50 mg total) by mouth 2 (two) times daily. 60 tablet 5  . Multiple Vitamins-Minerals (MULTIVITAMIN WITH MINERALS) tablet Take 1 tablet by mouth daily.    . simvastatin (ZOCOR) 20 MG tablet Take 1 tablet by mouth daily.     No current facility-administered medications for this visit.     No Known Allergies  Review of Systems   Weight stable no fever No edema No chest pain No abdominal pain Right hand numbness improved  BP (!) 147/78   Pulse 77   Resp 16   Ht 6\' 2"  (1.88 m)   Wt 262 lb (118.8 kg)   SpO2 98% Comment: ON RA  BMI 33.64 kg/m  Physical Exam      Exam    General- alert and comfortable   Lungs- clear without rales, wheezes   Cor- regular rate and  rhythm, soft systolic flow murmur, no diastolic murmur , no gallop   Abdomen- soft, non-tender   Extremities - warm, non-tender, minimal edema   Neuro- oriented, appropriate, no focal weakness   Diagnostic Tests: Chest x-ray clear sternal wires intact  Impression: Excellent early recovery after urgent repair of the [place] type A aortic dissection. Patient is not ready to return to work yet. He needs another 4-6 weeks of cardiac rehabilitation. We'll plan on a CT of the thoracic aorta approximately 6 months postop Plan: Return in one month for review of progress and blood pressure check.  Len Childs, MD [facility] [phone]

## 2016-07-02 ENCOUNTER — Encounter (HOSPITAL_COMMUNITY)
Admission: RE | Admit: 2016-07-02 | Discharge: 2016-07-02 | Disposition: A | Discharge status: Home / Self Care | Payer: 59 | Source: Ambulatory Visit | Attending: Cardiovascular Disease | Admitting: Cardiovascular Disease

## 2016-07-02 DIAGNOSIS — I7100 Dissection of unspecified site of aorta: Secondary | ICD-10-CM | POA: Diagnosis not present

## 2016-07-02 NOTE — Progress Notes (Signed)
Daily Session Note  Patient Details  Name: Jon Rodriguez MRN: [id number] Date of Birth: 1961/04/09 Referring Provider:   Flowsheet Row CARDIAC REHAB PHASE II ORIENTATION from 06/28/2016 in [place]  Referring Provider  Sanda Klein MD      Encounter Date: 07/02/2016  Check In:     Session Check In - 07/02/16 1009      Check-In   Location [facility]   Staff Present Maurice Small, RN, BSN;Amber Fair, MS, ACSM RCEP, Exercise Physiologist;Joann Rion, RN, Deland Pretty, MS, ACSM CEP, Exercise Physiologist;Syd Newsome, RN, BSN   Supervising physician immediately available to respond to emergencies [facility] immediately available   Physician(s) Dr. Waldron Labs   Medication changes reported     No   Fall or balance concerns reported    No   Warm-up and Cool-down Performed as group-led instruction   Resistance Training Performed Yes   VAD Patient? No     Pain Assessment   Currently in Pain? No/denies      Capillary Blood Glucose: No results found for this or any previous visit (from the past 24 hour(s)).   Goals Met:  Exercise tolerated well  Goals Unmet:  Not Applicable  Comments: Pt started cardiac rehab today.  Pt tolerated light exercise without difficulty. VSS, telemetry-Sinus rhythm, asymptomatic.  Medication list reconciled. Pt denies barriers to medicaiton compliance.  PSYCHOSOCIAL ASSESSMENT:  PHQ-0. Pt exhibits positive coping skills, hopeful outlook with supportive family. No psychosocial needs identified at this time, no psychosocial interventions necessary.    Pt enjoys reading.   Pt oriented to exercise equipment and routine.    Understanding verbalized. [name]'s blood pressure was well controlled today. Exit blood pressure 106/64 [name], RN,BSN 07/02/2016 11:49 AM   Dr. Fransico Him is Medical Director for Cardiac Rehab at [facility].

## 2016-07-04 ENCOUNTER — Encounter (HOSPITAL_COMMUNITY)
Admission: RE | Admit: 2016-07-04 | Discharge: 2016-07-04 | Disposition: A | Discharge status: Home / Self Care | Payer: 59 | Source: Ambulatory Visit | Attending: Cardiovascular Disease | Admitting: Cardiovascular Disease

## 2016-07-04 DIAGNOSIS — I7100 Dissection of unspecified site of aorta: Secondary | ICD-10-CM | POA: Diagnosis not present

## 2016-07-05 NOTE — Progress Notes (Signed)
Cardiac Individual Treatment Plan  Patient Details  Name: Jon Rodriguez MRN: [id number] Date of Birth: October 06, 1960 Referring Provider:   Flowsheet Row CARDIAC REHAB PHASE II ORIENTATION from 06/28/2016 in [place]  Referring Provider  Croitoru, Mihai MD      Initial Encounter Date:  [place] from 06/28/2016 in [place]  Date  06/28/16  Referring Provider  Croitoru, Mihai MD      Visit Diagnosis: Dissection of aorta, unspecified portion of aorta ([place])  Patient's Home Medications on Admission:  Current Outpatient Prescriptions:  .  acetaminophen (TYLENOL) 325 MG tablet, Take 2 tablets (650 mg total) by mouth every 6 (six) hours as needed for mild pain or fever., Disp: , Rfl:  .  amLODipine (NORVASC) 10 MG tablet, Take 1 tablet (10 mg total) by mouth daily., Disp: 30 tablet, Rfl: 5 .  aspirin 325 MG EC tablet, Take 325 mg by mouth daily., Disp: , Rfl:  .  co-enzyme Q-10 50 MG capsule, Take 50 mg by mouth daily., Disp: , Rfl:  .  FERROUS SULFATE PO, Take 45 mg by mouth daily., Disp: , Rfl:  .  glucosamine-chondroitin 500-400 MG tablet, Take 2 tablets by mouth every morning., Disp: , Rfl:  .  losartan (COZAAR) 100 MG tablet, Take 1 tablet (100 mg total) by mouth daily., Disp: 30 tablet, Rfl: 11 .  metoprolol tartrate (LOPRESSOR) 50 MG tablet, Take 1 tablet (50 mg total) by mouth 2 (two) times daily., Disp: 60 tablet, Rfl: 5 .  Multiple Vitamins-Minerals (MULTIVITAMIN WITH MINERALS) tablet, Take 1 tablet by mouth daily., Disp: , Rfl:  .  simvastatin (ZOCOR) 20 MG tablet, Take 1 tablet by mouth daily., Disp: , Rfl:   Past Medical History: Past Medical History:  Diagnosis Date  . High cholesterol   . Hypertension     Tobacco Use: History  Smoking Status  . Former Smoker  Smokeless Tobacco  . Former Systems developer  . Types: Chew    Labs: Recent Review Flowsheet Data    Labs for  ITP Cardiac and Pulmonary Rehab Latest Ref Rng & Units 04/06/2016 04/06/2016 04/07/2016 04/07/2016 06/11/2016   Cholestrol 125 - 200 mg/dL - - - - 170   LDLCALC <130 mg/dL - - - - 103   HDL >=40 mg/dL - - - - 51   Trlycerides <150 mg/dL - - - - 82   Hemoglobin A1c 4.8 - 5.6 % - - - - -   PHART 7.350 - 7.450 - 7.455(H) 7.470(H) - -   PCO2ART 35.0 - 45.0 mmHg - 56.3(H) 53.9(H) - -   HCO3 20.0 - 24.0 mEq/L - 38.5(H) 38.4(H) - -   TCO2 0 - 100 mmol/L 35 40.2 40.0 - -   ACIDBASEDEF 0.0 - 2.0 mmol/L - - - - -   O2SAT % - 92.8 89.4 59.1 -      Capillary Blood Glucose: Lab Results  Component Value Date   GLUCAP 110 (H) 04/09/2016   GLUCAP 125 (H) 04/09/2016   GLUCAP 114 (H) 04/09/2016   GLUCAP 112 (H) 04/09/2016   GLUCAP 115 (H) 04/08/2016     Exercise Target Goals:    Exercise Program Goal: Individual exercise prescription set with THRR, safety & activity barriers. Participant demonstrates ability to understand and report RPE using BORG scale, to self-measure pulse accurately, and to acknowledge the importance of the exercise prescription.  Exercise Prescription Goal: Starting with aerobic activity 30 plus  minutes a day, 3 days per week for initial exercise prescription. Provide home exercise prescription and guidelines that participant acknowledges understanding prior to discharge.  Activity Barriers & Risk Stratification:     Activity Barriers & Cardiac Risk Stratification - 06/28/16 1404      Activity Barriers & Cardiac Risk Stratification   Activity Barriers None      6 Minute Walk:     6 Minute Walk    Row Name 06/28/16 1406         6 Minute Walk   Phase Initial     Distance 1508 feet     Walk Time 6 minutes     # of Rest Breaks 0     MPH 2.85     METS 3.87     RPE 12     VO2 Peak 13.5     Symptoms No     Resting HR 87 bpm     Resting BP 128/76     Max Ex. HR 102 bpm     Max Ex. BP 148/76     2 Minute Post BP 120/72        Initial Exercise  Prescription:     Initial Exercise Prescription - 06/28/16 1400      Date of Initial Exercise RX and Referring Provider   Date 06/28/16   Referring Provider Croitoru, Mihai MD     Treadmill   MPH 1.9   Grade 0   Minutes 10   METs 2.45     Bike   Level 0.8   Minutes 10   METs 2.26     NuStep   Level 3   Minutes 10   METs 2     Prescription Details   Frequency (times per week) 3   Duration Progress to 30 minutes of continuous aerobic without signs/symptoms of physical distress     Intensity   THRR 40-80% of Max Heartrate 66-132   Ratings of Perceived Exertion 11-13   Perceived Dyspnea 0-4     Progression   Progression Continue to progress workloads to maintain intensity without signs/symptoms of physical distress.     Resistance Training   Training Prescription Yes   Weight 2lbs   Reps 10-12      Perform Capillary Blood Glucose checks as needed.  Exercise Prescription Changes:     Exercise Prescription Changes    Row Name 07/03/16 0700             Response to Exercise   Blood Pressure (Admit) 102/62       Blood Pressure (Exercise) 124/60       Blood Pressure (Exit) 106/64       Heart Rate (Admit) 74 bpm       Heart Rate (Exercise) 98 bpm       Heart Rate (Exit) 68 bpm       Rating of Perceived Exertion (Exercise) 13       Symptoms none       Duration Progress to 30 minutes of continuous aerobic without signs/symptoms of physical distress       Intensity THRR unchanged         Progression   Progression Continue to progress workloads to maintain intensity without signs/symptoms of physical distress.       Average METs 2.6         Resistance Training   Training Prescription Yes       Weight 3lbs       Reps 10-12  Treadmill   MPH 2.2       Grade 2       Minutes 10       METs 3.29         Bike   Level 0.8       Minutes 10       METs 2.26         NuStep   Level 3       Minutes 10       METs 2.2          Exercise Comments:      Exercise Comments    Row Name 07/03/16 0725           Exercise Comments Off to a good start with exercise.          Discharge Exercise Prescription (Final Exercise Prescription Changes):     Exercise Prescription Changes - 07/03/16 0700      Response to Exercise   Blood Pressure (Admit) 102/62   Blood Pressure (Exercise) 124/60   Blood Pressure (Exit) 106/64   Heart Rate (Admit) 74 bpm   Heart Rate (Exercise) 98 bpm   Heart Rate (Exit) 68 bpm   Rating of Perceived Exertion (Exercise) 13   Symptoms none   Duration Progress to 30 minutes of continuous aerobic without signs/symptoms of physical distress   Intensity THRR unchanged     Progression   Progression Continue to progress workloads to maintain intensity without signs/symptoms of physical distress.   Average METs 2.6     Resistance Training   Training Prescription Yes   Weight 3lbs   Reps 10-12     Treadmill   MPH 2.2   Grade 2   Minutes 10   METs 3.29     Bike   Level 0.8   Minutes 10   METs 2.26     NuStep   Level 3   Minutes 10   METs 2.2      Nutrition:  Target Goals: Understanding of nutrition guidelines, daily intake of sodium 1500mg , cholesterol 200mg , calories 30% from fat and 7% or less from saturated fats, daily to have 5 or more servings of fruits and vegetables.  Biometrics:     Pre Biometrics - 06/28/16 1414      Pre Biometrics   Waist Circumference 47.75 inches   Hip Circumference 45.75 inches   Waist to Hip Ratio 1.04 %   Triceps Skinfold 43 mm   % Body Fat 37.2 %   Grip Strength 39 kg   Flexibility 12.25 in   Single Leg Stand 26.67 seconds       Nutrition Therapy Plan and Nutrition Goals:   Nutrition Discharge: Nutrition Scores:     Nutrition Assessments - 06/28/16 1050      MEDFICTS Scores   Pre Score 52      Nutrition Goals Re-Evaluation:   Psychosocial: Target Goals: Acknowledge presence or absence of depression, maximize coping skills, provide  positive support system. Participant is able to verbalize types and ability to use techniques and skills needed for reducing stress and depression.  Initial Review & Psychosocial Screening:     Initial Psych Review & Screening - 06/28/16 1453      Screening Interventions   Interventions Encouraged to exercise      Quality of Life Scores:     Quality of Life - 06/28/16 1415      Quality of Life Scores   Health/Function Pre 22.07 %   Socioeconomic Pre  22.36 %   Psych/Spiritual Pre 22.58 %   Family Pre 26.38 %   GLOBAL Pre 22.77 %      PHQ-9: Recent Review Flowsheet Data    Depression screen [facility] 2/9 07/02/2016   Decreased Interest 0   Down, Depressed, Hopeless 0   PHQ - 2 Score 0      Psychosocial Evaluation and Intervention:   Psychosocial Re-Evaluation:     Psychosocial Re-Evaluation    [place] Name 07/05/16 1613             Psychosocial Re-Evaluation   Interventions Encouraged to attend Cardiac Rehabilitation for the exercise       Continued Psychosocial Services Needed No          Vocational Rehabilitation: Provide vocational rehab assistance to qualifying candidates.   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      Education: Education Goals: Education classes will be provided on a weekly basis, covering required topics. Participant will state understanding/return demonstration of topics presented.  Learning Barriers/Preferences:     Learning Barriers/Preferences - 06/28/16 0836      Learning Barriers/Preferences   Learning Barriers Sight   Learning Preferences Pictoral;Skilled Demonstration;Written Material;Individual Instruction      Education Topics: Count Your Pulse:  -Group instruction  provided by verbal instruction, demonstration, patient participation and written materials to support subject.  Instructors address importance of being able to find your pulse and how to count your pulse when at home without a heart monitor.  Patients get hands on experience counting their pulse with staff help and individually.   Heart Attack, Angina, and Risk Factor Modification:  -Group instruction provided by verbal instruction, video, and written materials to support subject.  Instructors address signs and symptoms of angina and heart attacks.    Also discuss risk factors for heart disease and how to make changes to improve heart health risk factors.   Functional Fitness:  -Group instruction provided by verbal instruction, demonstration, patient participation, and written materials to support subject.  Instructors address safety measures for doing things around the house.  Discuss how to get up and down off the floor, how to pick things up properly, how to safely get out of a chair without assistance, and balance training.   Meditation and Mindfulness:  -Group instruction provided by verbal instruction, patient participation, and written materials to support subject.  Instructor addresses importance of mindfulness and meditation practice to help reduce stress and improve awareness.  Instructor also leads participants through a meditation exercise.    Stretching for Flexibility and Mobility:  -Group instruction provided by verbal instruction, patient participation, and written materials to support subject.  Instructors lead participants through series of stretches that are designed to increase flexibility thus improving mobility.  These stretches are additional exercise for major muscle groups that are typically performed during regular warm up and cool down.   Hands Only CPR Anytime:  -Group instruction provided by verbal instruction, video, patient participation and written materials to  support subject.  Instructors co-teach with AHA video for hands only CPR.  Participants get hands on experience with mannequins.   Nutrition I class: Heart Healthy Eating:  -Group instruction provided by PowerPoint slides, verbal discussion, and written materials to  support subject matter. The instructor gives an explanation and review of the Therapeutic Lifestyle Changes diet recommendations, which includes a discussion on lipid goals, dietary fat, sodium, fiber, plant stanol/sterol esters, sugar, and the components of a well-balanced, healthy diet.   Nutrition II class: Lifestyle Skills:  -Group instruction provided by PowerPoint slides, verbal discussion, and written materials to support subject matter. The instructor gives an explanation and review of label reading, grocery shopping for heart health, heart healthy recipe modifications, and ways to make healthier choices when eating out.   Diabetes Question & Answer:  -Group instruction provided by PowerPoint slides, verbal discussion, and written materials to support subject matter. The instructor gives an explanation and review of diabetes co-morbidities, pre- and post-prandial blood glucose goals, pre-exercise blood glucose goals, signs, symptoms, and treatment of hypoglycemia and hyperglycemia, and foot care basics.   Diabetes Blitz:  -Group instruction provided by PowerPoint slides, verbal discussion, and written materials to support subject matter. The instructor gives an explanation and review of the physiology behind type 1 and type 2 diabetes, diabetes medications and rational behind using different medications, pre- and post-prandial blood glucose recommendations and Hemoglobin A1c goals, diabetes diet, and exercise including blood glucose guidelines for exercising safely.    Portion Distortion:  -Group instruction provided by PowerPoint slides, verbal discussion, written materials, and food models to support subject matter. The  instructor gives an explanation of serving size versus portion size, changes in portions sizes over the last 20 years, and what consists of a serving from each food group.   Stress Management:  -Group instruction provided by verbal instruction, video, and written materials to support subject matter.  Instructors review role of stress in heart disease and how to cope with stress positively.     Exercising on Your Own:  -Group instruction provided by verbal instruction, power point, and written materials to support subject.  Instructors discuss benefits of exercise, components of exercise, frequency and intensity of exercise, and end points for exercise.  Also discuss use of nitroglycerin and activating EMS.  Review options of places to exercise outside of rehab.  Review guidelines for sex with heart disease.   Cardiac Drugs I:  -Group instruction provided by verbal instruction and written materials to support subject.  Instructor reviews cardiac drug classes: antiplatelets, anticoagulants, beta blockers, and statins.  Instructor discusses reasons, side effects, and lifestyle considerations for each drug class.   Cardiac Drugs II:  -Group instruction provided by verbal instruction and written materials to support subject.  Instructor reviews cardiac drug classes: angiotensin converting enzyme inhibitors (ACE-I), angiotensin II receptor blockers (ARBs), nitrates, and calcium channel blockers.  Instructor discusses reasons, side effects, and lifestyle considerations for each drug class.   Anatomy and Physiology of the Circulatory System:  -Group instruction provided by verbal instruction, video, and written materials to support subject.  Reviews functional anatomy of heart, how it relates to various diagnoses, and what role the heart plays in the overall system.   Knowledge Questionnaire Score:     Knowledge Questionnaire Score - 06/28/16 1405      Knowledge Questionnaire Score   Pre Score  24/24      Core Components/Risk Factors/Patient Goals at Admission:     Personal Goals and Risk Factors at Admission - 06/28/16 0836      Core Components/Risk Factors/Patient Goals on Admission    Weight Management Yes;Weight Maintenance;Weight Loss   Intervention Weight Management: Develop a combined nutrition and exercise program designed to reach desired caloric intake,  while maintaining appropriate intake of nutrient and fiber, sodium and fats, and appropriate energy expenditure required for the weight goal.;Weight Management: Provide education and appropriate resources to help participant work on and attain dietary goals.;Weight Management/Obesity: Establish reasonable short term and long term weight goals.;Obesity: Provide education and appropriate resources to help participant work on and attain dietary goals.   Expected Outcomes Short Term: Continue to assess and modify interventions until short term weight is achieved;Long Term: Adherence to nutrition and physical activity/exercise program aimed toward attainment of established weight goal;Weight Maintenance: Understanding of the daily nutrition guidelines, which includes 25-35% calories from fat, 7% or less cal from saturated fats, less than 200mg  cholesterol, less than 1.5gm of sodium, & 5 or more servings of fruits and vegetables daily;Weight Loss: Understanding of general recommendations for a balanced deficit meal plan, which promotes 1-2 lb weight loss per week and includes a negative energy balance of [phone] kcal/d;Understanding of distribution of calorie intake throughout the day with the consumption of 4-5 meals/snacks;Understanding recommendations for meals to include 15-35% energy as protein, 25-35% energy from fat, 35-60% energy from carbohydrates, less than 200mg  of dietary cholesterol, 20-35 gm of total fiber daily   Increase Strength and Stamina Yes   Intervention Provide advice, education, support and counseling about  physical activity/exercise needs.;Develop an individualized exercise prescription for aerobic and resistive training based on initial evaluation findings, risk stratification, comorbidities and participant's personal goals.   Expected Outcomes Achievement of increased cardiorespiratory fitness and enhanced flexibility, muscular endurance and strength shown through measurements of functional capacity and personal statement of participant.   Improve shortness of breath with ADL's Yes   Intervention Provide education, individualized exercise plan and daily activity instruction to help decrease symptoms of SOB with activities of daily living.   Expected Outcomes Short Term: Achieves a reduction of symptoms when performing activities of daily living.   Personal Goal Other Yes   Personal Goal short: lose 8-10lbs . Get back to lifting weights. Long term: lose wt ~50lbs; back to weight lifting, return to running and improve balance/flexibity   Intervention Provide exercise programming that consist of weight training, balance and improving overall areobic capacity. Provide nutrition counseling to assist with HH diet and weightloss   Expected Outcomes Pt will return to running, lifting weights and lose weight without losing muscle tone.      Core Components/Risk Factors/Patient Goals Review:      Goals and Risk Factor Review    Row Name 07/03/16 0726             Core Components/Risk Factors/Patient Goals Review   Personal Goals Review Other       Review Patient off to a good start with exercise.       Expected Outcomes Begin regular aerobic and resistance training exercise program to help achieve health and fitness goals.          Core Components/Risk Factors/Patient Goals at Discharge (Final Review):      Goals and Risk Factor Review - 07/03/16 0726      Core Components/Risk Factors/Patient Goals Review   Personal Goals Review Other   Review Patient off to a good start with exercise.    Expected Outcomes Begin regular aerobic and resistance training exercise program to help achieve health and fitness goals.      ITP Comments:     ITP Comments    Row Name 06/28/16 [phone]           ITP Comments Dr. Loreta Ave, Medical  Director          Comments: Marden Noble is off to a good start after completing 3 sessions. Recommend continued exercise and life style modification education including  stress management and relaxation techniques to decrease cardiac risk profile. [name], RN,BSN 07/05/2016 4:15 PM

## 2016-07-06 ENCOUNTER — Encounter (HOSPITAL_COMMUNITY)
Admission: RE | Admit: 2016-07-06 | Discharge: 2016-07-06 | Disposition: A | Discharge status: Home / Self Care | Payer: 59 | Source: Ambulatory Visit | Attending: Cardiovascular Disease | Admitting: Cardiovascular Disease

## 2016-07-06 DIAGNOSIS — I7100 Dissection of unspecified site of aorta: Secondary | ICD-10-CM | POA: Diagnosis not present

## 2016-07-09 ENCOUNTER — Encounter (HOSPITAL_COMMUNITY)
Admission: RE | Admit: 2016-07-09 | Discharge: 2016-07-09 | Disposition: A | Discharge status: Home / Self Care | Payer: 59 | Source: Ambulatory Visit | Attending: Cardiovascular Disease | Admitting: Cardiovascular Disease

## 2016-07-09 DIAGNOSIS — I7100 Dissection of unspecified site of aorta: Secondary | ICD-10-CM | POA: Diagnosis not present

## 2016-07-09 NOTE — Progress Notes (Signed)
Reviewed home exercise guidelines with patient including endpoints, temperature precautions, target heart rate and rate of perceived exertion. Pt is currently walking on his treadmill at home 35 minutes on Tuesday, Thursdays, and Saturdays as his mode of home exercise. Pt voices understanding of instructions given. Sol Passer, MS, ACSM CCEP

## 2016-07-09 NOTE — Progress Notes (Signed)
Jon Rodriguez 55 y.o. male Nutrition Note Spoke with pt. Nutrition Plan and Nutrition Survey goals reviewed with pt. Pt is following Step 1 of the Therapeutic Lifestyle Changes diet. Pt wants to lose wt. Pt has been trying to lose wt by decreasing portion sizes and making healthier food choices. Pt states he lost 25 lb before his aortic dissection; "I weighed over 300 lb in January 2017." Pt wt is down more than 38 lb from his highest reported wt 9 months ago. Rate of wt loss appears safe. Wt loss tips reviewed. Pt expressed understanding of the information reviewed. Pt aware of nutrition education classes offered and plans on attending nutrition classes.  Lab Results  Component Value Date   HGBA1C 5.0 04/02/2016   Wt Readings from Last 3 Encounters:  06/29/16 262 lb (118.8 kg)  06/28/16 264 lb 5.3 oz (119.9 kg)  06/04/16 263 lb 12.8 oz (119.7 kg)    Nutrition Diagnosis ? Food-and nutrition-related knowledge deficit related to lack of exposure to information as related to diagnosis of: ? CVD ? Obesity related to excessive energy intake as evidenced by a BMI of 34.9  Nutrition Intervention ? Pt's individual nutrition plan reviewed with pt. ? Benefits of adopting Therapeutic Lifestyle Changes discussed when Medficts reviewed. ? Pt to attend the Portion Distortion class ? Pt to attend the   ? Nutrition I class                     ? Nutrition II class ?  Continue client-centered nutrition education by RD, as part of interdisciplinary care. Goal(s) ? Pt to identify and limit food sources of saturated fat, trans fat, and sodium ? Pt to identify food quantities necessary to achieve weight loss of 6-24 lb (2.7-10.9 kg) at graduation from cardiac rehab.  Monitor and Evaluate progress toward nutrition goal with team. Derek Mound, M.Ed, RD, LDN, CDE 07/09/2016 10:49 AM

## 2016-07-11 ENCOUNTER — Encounter (HOSPITAL_COMMUNITY)
Admission: RE | Admit: 2016-07-11 | Discharge: 2016-07-11 | Disposition: A | Discharge status: Home / Self Care | Payer: 59 | Source: Ambulatory Visit | Attending: Cardiovascular Disease | Admitting: Cardiovascular Disease

## 2016-07-11 DIAGNOSIS — I7100 Dissection of unspecified site of aorta: Secondary | ICD-10-CM | POA: Diagnosis not present

## 2016-07-13 ENCOUNTER — Encounter (HOSPITAL_COMMUNITY)
Admission: RE | Admit: 2016-07-13 | Discharge: 2016-07-13 | Disposition: A | Discharge status: Home / Self Care | Payer: 59 | Source: Ambulatory Visit | Attending: Cardiovascular Disease | Admitting: Cardiovascular Disease

## 2016-07-13 DIAGNOSIS — I7100 Dissection of unspecified site of aorta: Secondary | ICD-10-CM | POA: Diagnosis not present

## 2016-07-16 ENCOUNTER — Encounter (HOSPITAL_COMMUNITY)
Admission: RE | Admit: 2016-07-16 | Discharge: 2016-07-16 | Disposition: A | Discharge status: Home / Self Care | Payer: 59 | Source: Ambulatory Visit | Attending: Cardiovascular Disease | Admitting: Cardiovascular Disease

## 2016-07-16 DIAGNOSIS — I7100 Dissection of unspecified site of aorta: Secondary | ICD-10-CM | POA: Diagnosis not present

## 2016-07-18 ENCOUNTER — Encounter (HOSPITAL_COMMUNITY): Payer: 59

## 2016-07-18 ENCOUNTER — Encounter: Payer: Self-pay | Admitting: Cardiovascular Disease

## 2016-07-18 ENCOUNTER — Ambulatory Visit (INDEPENDENT_AMBULATORY_CARE_PROVIDER_SITE_OTHER): Payer: 59 | Admitting: Cardiovascular Disease

## 2016-07-18 VITALS — BP 132/54 | HR 62 | Ht 74.0 in | Wt 265.6 lb

## 2016-07-18 DIAGNOSIS — I7101 Dissection of thoracic aorta: Secondary | ICD-10-CM

## 2016-07-18 DIAGNOSIS — I71019 Dissection of thoracic aorta, unspecified: Secondary | ICD-10-CM

## 2016-07-18 DIAGNOSIS — I351 Nonrheumatic aortic (valve) insufficiency: Secondary | ICD-10-CM | POA: Diagnosis not present

## 2016-07-18 DIAGNOSIS — D62 Acute posthemorrhagic anemia: Secondary | ICD-10-CM | POA: Diagnosis not present

## 2016-07-18 DIAGNOSIS — I10 Essential (primary) hypertension: Secondary | ICD-10-CM | POA: Diagnosis not present

## 2016-07-18 DIAGNOSIS — E6609 Other obesity due to excess calories: Secondary | ICD-10-CM

## 2016-07-18 DIAGNOSIS — Z6834 Body mass index (BMI) 34.0-34.9, adult: Secondary | ICD-10-CM

## 2016-07-18 DIAGNOSIS — E7800 Pure hypercholesterolemia, unspecified: Secondary | ICD-10-CM

## 2016-07-18 NOTE — Patient Instructions (Signed)
Dr Sallyanne Kuster recommends that you continue on your current medications as directed. Please refer to the Current Medication list given to you today.  Your physician has requested that you have an echocardiogram in 1 year. Echocardiography is a painless test that uses sound waves to create images of your heart. It provides your doctor with information about the size and shape of your heart and how well your heart's chambers and valves are working. This procedure takes approximately one hour. There are no restrictions for this procedure. This will be performed at our [facility] location - [address].  Dr Sallyanne Kuster recommends that you schedule a follow-up appointment in 1 year. You will receive a reminder letter in the mail two months in advance. If you don't receive a letter, please call our office to schedule the follow-up appointment.  If you need a refill on your cardiac medications before your next appointment, please call your pharmacy.

## 2016-07-18 NOTE — Progress Notes (Signed)
Cardiology Office Note    Date:  07/18/2016   ID:  Jon Rodriguez, DOB 25-Aug-1961, MRN [id number]  PCP:  Melinda Crutch, MD  Cardiologist:   Sanda Klein, MD   Chief Complaint  Patient presents with  . Follow-up    History of Present Illness:  Jon Rodriguez is a 55 y.o. 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 He required transient postoperative intra-aortic balloon pump support for postoperative RV dysfunction.  He seems several recovered fully from surgery and is exercising on a regular basis. He feels well. We adjusted his antihypertensive medications at his last visit his blood pressure is now consistently in the normal range at cardiac rehabilitation (typically 110s/60s). He has a distinct aortic insufficiency murmur and his echocardiogram confirms that he has moderate residual aortic insufficiency, with preserved left ventricle systolic function and without evidence of left ventricular dilation.  Denies bleeding, leg edema, headaches, chest discomfort, fever or chills, cough or hemoptysis. He does complain about stinging tenderness overlying his surgical scars, which both have some keloid formation.  Past Medical History:  Diagnosis Date  . High cholesterol   . Hypertension     Past Surgical History:  Procedure Laterality Date  . REPLACEMENT ASCENDING AORTA N/A 03/31/2016   Procedure: REPLACEMENT OF ASCENDING AORTA USING A 73mm HEMASHIELD PLATINUM VASCULAR GRAFT;  Surgeon: Ivin Poot, MD;  Location: [place];  Service: Open Heart Surgery;  Laterality: N/A;    Current Medications: Outpatient Medications Prior to Visit  Medication Sig Dispense Refill  . acetaminophen (TYLENOL) 325 MG tablet Take 2 tablets (650 mg total) by mouth every 6 (six) hours as needed for mild  pain or fever.    Marland Kitchen amLODipine (NORVASC) 10 MG tablet Take 1 tablet (10 mg total) by mouth daily. 30 tablet 5  . aspirin 325 MG EC tablet Take 325 mg by mouth daily.    Marland Kitchen co-enzyme Q-10 50 MG capsule Take 50 mg by mouth daily.    Marland Kitchen FERROUS SULFATE PO Take 45 mg by mouth daily.    Marland Kitchen glucosamine-chondroitin 500-400 MG tablet Take 2 tablets by mouth every morning.    Marland Kitchen losartan (COZAAR) 100 MG tablet Take 1 tablet (100 mg total) by mouth daily. 30 tablet 11  . metoprolol tartrate (LOPRESSOR) 50 MG tablet Take 1 tablet (50 mg total) by mouth 2 (two) times daily. 60 tablet 5  . Multiple Vitamins-Minerals (MULTIVITAMIN WITH MINERALS) tablet Take 1 tablet by mouth daily.    . simvastatin (ZOCOR) 20 MG tablet Take 1 tablet by mouth daily.     No facility-administered medications prior to visit.      Allergies:   Review of patient's allergies indicates no known allergies.   Social History   Social History  . Marital status: Divorced    Spouse name: N/A  . Number of children: N/A  . Years of education: N/A   Social History Main Topics  . Smoking status: Former Research scientist (life sciences)  . Smokeless tobacco: Former Systems developer    Types: Chew  . Alcohol use No  . Drug use: Unknown  . Sexual activity: Not Asked   Other Topics Concern  . None   Social History Narrative  . None     Family History:  The patient's family history includes CAD in his mother;  Hyperlipidemia in his father and mother; Hypertension in his father; Prostate cancer in his father.   ROS:   Please see the history of present illness.    ROS All other systems reviewed and are negative.   PHYSICAL EXAM:   VS:  BP (!) 132/54 (BP Location: Right Arm, Patient Position: Sitting, Cuff Size: Large)   Pulse 62   Ht 6\' 2"  (1.88 m)   Wt 265 lb 9.6 oz (120.5 kg)   BMI 34.10 kg/m    GEN: Well nourished, well developed, in no acute distress  HEENT: normal  Neck: no JVD, carotid bruits, or masses Cardiac: RRR; 3/6 decrescendo diastolic murmur at  the left lower sternal border, no rubs, or gallops,no edema  Respiratory:  clear to auscultation bilaterally, normal work of breathing. Some keloid formation on the sternotomy scar but especially at the right subclavian bypass axis scar GI: soft, nontender, nondistended, + BS MS: no deformity or atrophy  Skin: warm and dry, no rash Neuro:  Alert and Oriented x 3, Strength and sensation are intact Psych: euthymic mood, full affect  Wt Readings from Last 3 Encounters:  07/18/16 265 lb 9.6 oz (120.5 kg)  06/29/16 262 lb (118.8 kg)  06/28/16 264 lb 5.3 oz (119.9 kg)      Studies/Labs Reviewed:   EKG:  EKG is not ordered today.  The ekg ordered August 2 demonstrates sinus rhythm and prolonged QT, improving from previous EKG.  Recent Labs: 04/07/2016: Magnesium 2.2 06/11/2016: ALT 16; BUN 16; Creat 0.75; Hemoglobin 11.9; Platelets 399; Potassium 4.3; Sodium 139     ASSESSMENT:    1. Aortic dissection, thoracic ([place])   2. Nonrheumatic aortic valve insufficiency   3. Benign essential HTN   4. Acute blood loss anemia   5. Hypercholesterolemia   6. Class 1 obesity due to excess calories with serious comorbidity and body mass index (BMI) of 34.0 to 34.9 in adult      PLAN:  In order of problems listed above:  1. S/P Asc Ao Dissection: Now about 3.5 months after surgical repair. No evidence of limb or end organ ischemia by history and exam. Asymptomatic, actively engaged in cardiac rehabilitation. 2. AI: Moderate, but not severe and without signs of left ventricular dysfunction. Reevaluate by echo on a yearly basis. Very distinct murmur on exam.  3. HTN: At target blood pressure 120s/70s or less on a typical day. 4. Anemia: Rechecked hemoglobin almost back to normal. 5. HLP: Recheck lipid profile at his next appointment.  6. Obesity: In the long run, weight loss is critical to prevent future complications.    Medication Adjustments/Labs and Tests Ordered: Current medicines are  reviewed at length with the patient today.  Concerns regarding medicines are outlined above.  Medication changes, Labs and Tests ordered today are listed in the Patient Instructions below. Patient Instructions  Dr Sallyanne Kuster recommends that you continue on your current medications as directed. Please refer to the Current Medication list given to you today.  Your physician has requested that you have an echocardiogram in 1 year. Echocardiography is a painless test that uses sound waves to create images of your heart. It provides your doctor with information about the size and shape of your heart and how well your heart's chambers and valves are working. This procedure takes approximately one hour. There are no restrictions for this procedure. This will be performed at our [facility] location - [address].  Dr Sallyanne Kuster recommends that you schedule  a follow-up appointment in 1 year. You will receive a reminder letter in the mail two months in advance. If you don't receive a letter, please call our office to schedule the follow-up appointment.  If you need a refill on your cardiac medications before your next appointment, please call your pharmacy.    Signed, Sanda Klein, MD  07/18/2016 4:46 PM    [facility] [place], [place], [place]  [address] Phone: [phone]; Fax: [phone]

## 2016-07-20 ENCOUNTER — Encounter (HOSPITAL_COMMUNITY)
Admission: RE | Admit: 2016-07-20 | Discharge: 2016-07-20 | Disposition: A | Discharge status: Home / Self Care | Payer: 59 | Source: Ambulatory Visit | Attending: Cardiovascular Disease | Admitting: Cardiovascular Disease

## 2016-07-20 DIAGNOSIS — I7100 Dissection of unspecified site of aorta: Secondary | ICD-10-CM

## 2016-07-23 ENCOUNTER — Encounter (HOSPITAL_COMMUNITY)
Admission: RE | Admit: 2016-07-23 | Discharge: 2016-07-23 | Disposition: A | Discharge status: Home / Self Care | Payer: 59 | Source: Ambulatory Visit | Attending: Cardiovascular Disease | Admitting: Cardiovascular Disease

## 2016-07-23 DIAGNOSIS — I7100 Dissection of unspecified site of aorta: Secondary | ICD-10-CM

## 2016-07-25 ENCOUNTER — Encounter (HOSPITAL_COMMUNITY)
Admission: RE | Admit: 2016-07-25 | Discharge: 2016-07-25 | Disposition: A | Discharge status: Home / Self Care | Payer: 59 | Source: Ambulatory Visit | Attending: Cardiovascular Disease | Admitting: Cardiovascular Disease

## 2016-07-25 ENCOUNTER — Ambulatory Visit (INDEPENDENT_AMBULATORY_CARE_PROVIDER_SITE_OTHER): Payer: 59 | Admitting: Cardiothoracic Surgery

## 2016-07-25 ENCOUNTER — Encounter: Payer: Self-pay | Admitting: Cardiothoracic Surgery

## 2016-07-25 VITALS — BP 125/60 | HR 80 | Resp 16 | Ht 74.0 in | Wt 265.0 lb

## 2016-07-25 DIAGNOSIS — Z09 Encounter for follow-up examination after completed treatment for conditions other than malignant neoplasm: Secondary | ICD-10-CM

## 2016-07-25 DIAGNOSIS — I713 Abdominal aortic aneurysm, ruptured: Secondary | ICD-10-CM | POA: Insufficient documentation

## 2016-07-25 DIAGNOSIS — I7101 Dissection of thoracic aorta: Secondary | ICD-10-CM

## 2016-07-25 DIAGNOSIS — I71019 Dissection of thoracic aorta, unspecified: Secondary | ICD-10-CM

## 2016-07-25 DIAGNOSIS — I7100 Dissection of unspecified site of aorta: Secondary | ICD-10-CM | POA: Insufficient documentation

## 2016-07-25 NOTE — Progress Notes (Signed)
PCP is Melinda Crutch, MD Referring Provider is Gus Height, MD  Chief Complaint  Patient presents with  . Routine Post Op    4 wk f/u s/p Repair of Aortic Dissection 03/31/16    HPI: Patient returns for blood pressure check and assessment of progress 4 months after urgent repair of ascending type A dissection. The patient has residual asymptomatic mild-moderate aortic insufficiency with good LV function. His blood pressure is under good control with Cozaar, Norvasc, metoprolol. He is taking aspirin. He is in sinus rhythm. He is completing his outpatient cardiac rehabilitation program. He will returning to work later this month. He has no lifting restrictions other than to avoid power lifting. He has no complaints other than some mild sternal soreness when he lays on his left side in bed.  Past Medical History:  Diagnosis Date  . High cholesterol   . Hypertension     Past Surgical History:  Procedure Laterality Date  . REPLACEMENT ASCENDING AORTA N/A 03/31/2016   Procedure: REPLACEMENT OF ASCENDING AORTA USING A 41mm HEMASHIELD PLATINUM VASCULAR GRAFT;  Surgeon: Ivin Poot, MD;  Location: [place];  Service: Open Heart Surgery;  Laterality: N/A;    Family History  Problem Relation Age of Onset  . Hypertension      SIblings  . CAD Mother     CABG  . Hyperlipidemia Mother   . Hypertension Father   . Prostate cancer Father   . Hyperlipidemia Father     Social History Social History  Substance Use Topics  . Smoking status: Former Research scientist (life sciences)  . Smokeless tobacco: Former Systems developer    Types: Chew  . Alcohol use No    Current Outpatient Prescriptions  Medication Sig Dispense Refill  . amLODipine (NORVASC) 10 MG tablet Take 1 tablet (10 mg total) by mouth daily. 30 tablet 5  . aspirin 325 MG EC tablet Take 325 mg by mouth daily.    Marland Kitchen co-enzyme Q-10 50 MG capsule Take 50 mg by mouth daily.    Marland Kitchen FERROUS SULFATE PO Take 45 mg by mouth daily.    Marland Kitchen glucosamine-chondroitin 500-400 MG tablet Take  2 tablets by mouth every morning.    Marland Kitchen losartan (COZAAR) 100 MG tablet Take 1 tablet (100 mg total) by mouth daily. 30 tablet 11  . metoprolol tartrate (LOPRESSOR) 50 MG tablet Take 1 tablet (50 mg total) by mouth 2 (two) times daily. 60 tablet 5  . Multiple Vitamins-Minerals (MULTIVITAMIN WITH MINERALS) tablet Take 1 tablet by mouth daily.    . simvastatin (ZOCOR) 20 MG tablet Take 1 tablet by mouth daily.     No current facility-administered medications for this visit.     No Known Allergies  Review of Systems: Gaining strength and exercise tolerance No palpitations  No ankle swelling No orthopnea Still losing weight gradually on a planned diet  BP 125/60   Pulse 80   Resp 16   Ht 6\' 2"  (1.88 m)   Wt 265 lb (120.2 kg)   SpO2 96% Comment: ON RA  BMI 34.02 kg/m  Physical Exam:      Exam    General- alert and comfortable   Lungs- clear without rales, wheezes   Cor- regular rate and rhythm, ,  mild AI murmur without gallop   Abdomen- soft, non-tender   Extremities - warm, non-tender, minimal edema   Neuro- oriented, appropriate, no focal weakness   Diagnostic Tests: None  Impression: Patient is doing well 4 months postop emergent repair of type  A dissection Blood pressure well controlled No chest pain No edema Mild-moderate AI, asymptomatic  Plan: Return for CTA of aorta 6 months postop Continue current antihypertensive medications   Len Childs, MD [facility] [phone]

## 2016-07-27 ENCOUNTER — Encounter (HOSPITAL_COMMUNITY)
Admission: RE | Admit: 2016-07-27 | Discharge: 2016-07-27 | Disposition: A | Discharge status: Home / Self Care | Payer: 59 | Source: Ambulatory Visit | Attending: Cardiovascular Disease | Admitting: Cardiovascular Disease

## 2016-07-27 DIAGNOSIS — I7100 Dissection of unspecified site of aorta: Secondary | ICD-10-CM | POA: Diagnosis not present

## 2016-07-30 ENCOUNTER — Encounter (HOSPITAL_COMMUNITY)
Admission: RE | Admit: 2016-07-30 | Discharge: 2016-07-30 | Disposition: A | Discharge status: Home / Self Care | Payer: 59 | Source: Ambulatory Visit | Attending: Cardiovascular Disease | Admitting: Cardiovascular Disease

## 2016-07-30 DIAGNOSIS — I7100 Dissection of unspecified site of aorta: Secondary | ICD-10-CM

## 2016-08-01 ENCOUNTER — Encounter (HOSPITAL_COMMUNITY)
Admission: RE | Admit: 2016-08-01 | Discharge: 2016-08-01 | Disposition: A | Discharge status: Home / Self Care | Payer: 59 | Source: Ambulatory Visit | Attending: Cardiovascular Disease | Admitting: Cardiovascular Disease

## 2016-08-01 ENCOUNTER — Telehealth (HOSPITAL_COMMUNITY): Payer: Self-pay | Admitting: *Deleted

## 2016-08-01 NOTE — Progress Notes (Signed)
Cardiac Individual Treatment Plan  Patient Details  Name: Jon Rodriguez MRN: [id number] Date of Birth: 1961-03-26 Referring Provider:   Flowsheet Row CARDIAC REHAB PHASE II ORIENTATION from 06/28/2016 in [facility]  Referring Provider  Croitoru, Dani Gobble MD      Initial Encounter Date:  [place] from 06/28/2016 in [place] [facility]  Date  06/28/16  Referring Provider  Croitoru, Mihai MD      Visit Diagnosis: Dissection of aorta, unspecified portion of aorta ([place])  Patient's Home Medications on Admission:  Current Outpatient Prescriptions:  .  amLODipine (NORVASC) 10 MG tablet, Take 1 tablet (10 mg total) by mouth daily., Disp: 30 tablet, Rfl: 5 .  aspirin 325 MG EC tablet, Take 325 mg by mouth daily., Disp: , Rfl:  .  co-enzyme Q-10 50 MG capsule, Take 50 mg by mouth daily., Disp: , Rfl:  .  FERROUS SULFATE PO, Take 45 mg by mouth daily., Disp: , Rfl:  .  glucosamine-chondroitin 500-400 MG tablet, Take 2 tablets by mouth every morning., Disp: , Rfl:  .  losartan (COZAAR) 100 MG tablet, Take 1 tablet (100 mg total) by mouth daily., Disp: 30 tablet, Rfl: 11 .  metoprolol tartrate (LOPRESSOR) 50 MG tablet, Take 1 tablet (50 mg total) by mouth 2 (two) times daily., Disp: 60 tablet, Rfl: 5 .  Multiple Vitamins-Minerals (MULTIVITAMIN WITH MINERALS) tablet, Take 1 tablet by mouth daily., Disp: , Rfl:  .  simvastatin (ZOCOR) 20 MG tablet, Take 1 tablet by mouth daily., Disp: , Rfl:   Past Medical History: Past Medical History:  Diagnosis Date  . High cholesterol   . Hypertension     Tobacco Use: History  Smoking Status  . Former Smoker  Smokeless Tobacco  . Former Systems developer  . Types: Chew    Labs: Recent Review Flowsheet Data    Labs for ITP Cardiac and Pulmonary Rehab Latest Ref Rng & Units 04/06/2016 04/06/2016 04/07/2016 04/07/2016 06/11/2016   Cholestrol 125 - 200 mg/dL - - - - 170   LDLCALC <130 mg/dL - - - - 103   HDL >=40 mg/dL - - - - 51   Trlycerides <150 mg/dL - - - - 82   Hemoglobin A1c 4.8 - 5.6 % - - - - -   PHART 7.350 - 7.450 - 7.455(H) 7.470(H) - -   PCO2ART 35.0 - 45.0 mmHg - 56.3(H) 53.9(H) - -   HCO3 20.0 - 24.0 mEq/L - 38.5(H) 38.4(H) - -   TCO2 0 - 100 mmol/L 35 40.2 40.0 - -   ACIDBASEDEF 0.0 - 2.0 mmol/L - - - - -   O2SAT % - 92.8 89.4 59.1 -      Capillary Blood Glucose: Lab Results  Component Value Date   GLUCAP 110 (H) 04/09/2016   GLUCAP 125 (H) 04/09/2016   GLUCAP 114 (H) 04/09/2016   GLUCAP 112 (H) 04/09/2016   GLUCAP 115 (H) 04/08/2016     Exercise Target Goals:    Exercise Program Goal: Individual exercise prescription set with THRR, safety & activity barriers. Participant demonstrates ability to understand and report RPE using BORG scale, to self-measure pulse accurately, and to acknowledge the importance of the exercise prescription.  Exercise Prescription Goal: Starting with aerobic activity 30 plus minutes a day, 3 days per week for initial exercise prescription. Provide home exercise prescription and guidelines that participant acknowledges understanding prior to discharge.  Activity Barriers & Risk Stratification:  Activity Barriers & Cardiac Risk Stratification - 06/28/16 1404      Activity Barriers & Cardiac Risk Stratification   Activity Barriers None      6 Minute Walk:     6 Minute Walk    Row Name 06/28/16 1406         6 Minute Walk   Phase Initial     Distance 1508 feet     Walk Time 6 minutes     # of Rest Breaks 0     MPH 2.85     METS 3.87     RPE 12     VO2 Peak 13.5     Symptoms No     Resting HR 87 bpm     Resting BP 128/76     Max Ex. HR 102 bpm     Max Ex. BP 148/76     2 Minute Post BP 120/72        Initial Exercise Prescription:     Initial Exercise Prescription - 06/28/16 1400      Date of Initial Exercise RX and Referring Provider   Date 06/28/16   Referring Provider  Croitoru, Mihai MD     Treadmill   MPH 1.9   Grade 0   Minutes 10   METs 2.45     Bike   Level 0.8   Minutes 10   METs 2.26     NuStep   Level 3   Minutes 10   METs 2     Prescription Details   Frequency (times per week) 3   Duration Progress to 30 minutes of continuous aerobic without signs/symptoms of physical distress     Intensity   THRR 40-80% of Max Heartrate 66-132   Ratings of Perceived Exertion 11-13   Perceived Dyspnea 0-4     Progression   Progression Continue to progress workloads to maintain intensity without signs/symptoms of physical distress.     Resistance Training   Training Prescription Yes   Weight 2lbs   Reps 10-12      Perform Capillary Blood Glucose checks as needed.  Exercise Prescription Changes:      Exercise Prescription Changes    Row Name 07/03/16 0700 07/11/16 1700 07/30/16 1700         Response to Exercise   Blood Pressure (Admit) 102/62 126/70 110/82     Blood Pressure (Exercise) 124/60 150/72 122/70     Blood Pressure (Exit) 106/64 110/62 112/64     Heart Rate (Admit) 74 bpm 78 bpm 75 bpm     Heart Rate (Exercise) 98 bpm 100 bpm 110 bpm     Heart Rate (Exit) 68 bpm 78 bpm 76 bpm     Rating of Perceived Exertion (Exercise) 13 11 13      Symptoms none none none     Duration Progress to 30 minutes of continuous aerobic without signs/symptoms of physical distress Progress to 30 minutes of continuous aerobic without signs/symptoms of physical distress Progress to 30 minutes of continuous aerobic without signs/symptoms of physical distress     Intensity THRR unchanged THRR unchanged THRR unchanged       Progression   Progression Continue to progress workloads to maintain intensity without signs/symptoms of physical distress. Continue to progress workloads to maintain intensity without signs/symptoms of physical distress. Continue to progress workloads to maintain intensity without signs/symptoms of physical distress.     Average  METs 2.6 2.7 3.8       Resistance Training  Training Prescription Yes Yes Yes     Weight 3lbs 4lb 5lbs     Reps 10-12 10-12 10-12       Treadmill   MPH 2.2 2.2 2.8     Grade 2 2 2      Minutes 10 10 10      METs 3.29 3.29 3.91       Bike   Level 0.8 0.8 1.8     Minutes 10 10 10      METs 2.26 2.26 3.8       NuStep   Level 3 4 4      Minutes 10 10 10      METs 2.2 2.6 3.7       Home Exercise Plan   Plans to continue exercise at  - Home Home     Frequency  - Add 3 additional days to program exercise sessions. Add 3 additional days to program exercise sessions.        Exercise Comments:      Exercise Comments    Row Name 07/03/16 0725 07/09/16 1004 07/11/16 1717 07/30/16 1717     Exercise Comments Off to a good start with exercise. Reviewed home exercise guidelines. Participant is walking on treadmill at home 35 minutes T,Th, and Sat. Reviewed home exercise guidelines. Participant is walking on treadmill at home 2.0 speed, 1.5 incline for 35 minutes T,Th, and Sat. Pt continues to progress with exercise and is walking at home on treadmill for 34min/day 3 days/wk       Discharge Exercise Prescription (Final Exercise Prescription Changes):     Exercise Prescription Changes - 07/30/16 1700      Response to Exercise   Blood Pressure (Admit) 110/82   Blood Pressure (Exercise) 122/70   Blood Pressure (Exit) 112/64   Heart Rate (Admit) 75 bpm   Heart Rate (Exercise) 110 bpm   Heart Rate (Exit) 76 bpm   Rating of Perceived Exertion (Exercise) 13   Symptoms none   Duration Progress to 30 minutes of continuous aerobic without signs/symptoms of physical distress   Intensity THRR unchanged     Progression   Progression Continue to progress workloads to maintain intensity without signs/symptoms of physical distress.   Average METs 3.8     Resistance Training   Training Prescription Yes   Weight 5lbs   Reps 10-12     Treadmill   MPH 2.8   Grade 2   Minutes 10   METs  3.91     Bike   Level 1.8   Minutes 10   METs 3.8     NuStep   Level 4   Minutes 10   METs 3.7     Home Exercise Plan   Plans to continue exercise at Home   Frequency Add 3 additional days to program exercise sessions.      Nutrition:  Target Goals: Understanding of nutrition guidelines, daily intake of sodium 1500mg , cholesterol 200mg , calories 30% from fat and 7% or less from saturated fats, daily to have 5 or more servings of fruits and vegetables.  Biometrics:     Pre Biometrics - 06/28/16 1414      Pre Biometrics   Waist Circumference 47.75 inches   Hip Circumference 45.75 inches   Waist to Hip Ratio 1.04 %   Triceps Skinfold 43 mm   % Body Fat 37.2 %   Grip Strength 39 kg   Flexibility 12.25 in   Single Leg Stand 26.67 seconds  Nutrition Therapy Plan and Nutrition Goals:     Nutrition Therapy & Goals - 07/09/16 1053      Nutrition Therapy   Diet Therapeutic Lifestyle Changes     Personal Nutrition Goals   Personal Goal #1 1-2 lb wt loss/week to a wt loss goal of 6-24 lb at graduation from [facility], educate and counsel regarding individualized specific dietary modifications aiming towards targeted core components such as weight, hypertension, lipid management, diabetes, heart failure and other comorbidities.   Expected Outcomes Short Term Goal: Understand basic principles of dietary content, such as calories, fat, sodium, cholesterol and nutrients.;Long Term Goal: Adherence to prescribed nutrition plan.      Nutrition Discharge: Nutrition Scores:     Nutrition Assessments - 06/28/16 1050      MEDFICTS Scores   Pre Score 52      Nutrition Goals Re-Evaluation:   Psychosocial: Target Goals: Acknowledge presence or absence of depression, maximize coping skills, provide positive support system. Participant is able to verbalize types and ability to use techniques and skills needed for  reducing stress and depression.  Initial Review & Psychosocial Screening:     Initial Psych Review & Screening - 06/28/16 1453      Screening Interventions   Interventions Encouraged to exercise      Quality of Life Scores:     Quality of Life - 06/28/16 1415      Quality of Life Scores   Health/Function Pre 22.07 %   Socioeconomic Pre 22.36 %   Psych/Spiritual Pre 22.58 %   Family Pre 26.38 %   GLOBAL Pre 22.77 %      PHQ-9: Recent Review Flowsheet Data    Depression screen PHQ 2/9 07/02/2016   Decreased Interest 0   Down, Depressed, Hopeless 0   PHQ - 2 Score 0      Psychosocial Evaluation and Intervention:   Psychosocial Re-Evaluation:     Psychosocial Re-Evaluation    Row Name 07/05/16 1613 08/01/16 1848           Psychosocial Re-Evaluation   Interventions Encouraged to attend Cardiac Rehabilitation for the exercise Encouraged to attend Cardiac Rehabilitation for the exercise      Continued Psychosocial Services Needed No No         Vocational Rehabilitation: Provide vocational rehab assistance to qualifying candidates.   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      Education: Education Goals: Education classes will be provided on a weekly basis, covering required topics. Participant will state understanding/return demonstration of topics presented.  Learning Barriers/Preferences:     Learning Barriers/Preferences - 06/28/16 0836      Learning Barriers/Preferences   Learning Barriers Sight   Learning Preferences Pictoral;Skilled Demonstration;Written Material;Individual Instruction      Education Topics: Count Your Pulse:  -Group instruction provided by verbal instruction,  demonstration, patient participation and written materials to support subject.  Instructors address importance of being able to find your pulse and how to count your pulse when at home without a heart monitor.  Patients get hands on experience counting their pulse with staff help and individually.   Heart Attack, Angina, and Risk Factor Modification:  -  Group instruction provided by verbal instruction, video, and written materials to support subject.  Instructors address signs and symptoms of angina and heart attacks.    Also discuss risk factors for heart disease and how to make changes to improve heart health risk factors.   Functional Fitness:  -Group instruction provided by verbal instruction, demonstration, patient participation, and written materials to support subject.  Instructors address safety measures for doing things around the house.  Discuss how to get up and down off the floor, how to pick things up properly, how to safely get out of a chair without assistance, and balance training.   Meditation and Mindfulness:  -Group instruction provided by verbal instruction, patient participation, and written materials to support subject.  Instructor addresses importance of mindfulness and meditation practice to help reduce stress and improve awareness.  Instructor also leads participants through a meditation exercise.    Stretching for Flexibility and Mobility:  -Group instruction provided by verbal instruction, patient participation, and written materials to support subject.  Instructors lead participants through series of stretches that are designed to increase flexibility thus improving mobility.  These stretches are additional exercise for major muscle groups that are typically performed during regular warm up and cool down.   Hands Only CPR Anytime:  -Group instruction provided by verbal instruction, video, patient participation and written materials to support subject.  Instructors  co-teach with AHA video for hands only CPR.  Participants get hands on experience with mannequins.   Nutrition I class: Heart Healthy Eating:  -Group instruction provided by PowerPoint slides, verbal discussion, and written materials to support subject matter. The instructor gives an explanation and review of the Therapeutic Lifestyle Changes diet recommendations, which includes a discussion on lipid goals, dietary fat, sodium, fiber, plant stanol/sterol esters, sugar, and the components of a well-balanced, healthy diet.   Nutrition II class: Lifestyle Skills:  -Group instruction provided by PowerPoint slides, verbal discussion, and written materials to support subject matter. The instructor gives an explanation and review of label reading, grocery shopping for heart health, heart healthy recipe modifications, and ways to make healthier choices when eating out.   Diabetes Question & Answer:  -Group instruction provided by PowerPoint slides, verbal discussion, and written materials to support subject matter. The instructor gives an explanation and review of diabetes co-morbidities, pre- and post-prandial blood glucose goals, pre-exercise blood glucose goals, signs, symptoms, and treatment of hypoglycemia and hyperglycemia, and foot care basics.   Diabetes Blitz:  -Group instruction provided by PowerPoint slides, verbal discussion, and written materials to support subject matter. The instructor gives an explanation and review of the physiology behind type 1 and type 2 diabetes, diabetes medications and rational behind using different medications, pre- and post-prandial blood glucose recommendations and Hemoglobin A1c goals, diabetes diet, and exercise including blood glucose guidelines for exercising safely.    Portion Distortion:  -Group instruction provided by PowerPoint slides, verbal discussion, written materials, and food models to support subject matter. The instructor gives an explanation of  serving size versus portion size, changes in portions sizes over the last 20 years, and what consists of a serving from each food group.   Stress Management:  -Group instruction provided by verbal instruction, video, and written materials to support subject matter.  Instructors review role of stress in heart disease and how to cope with stress positively.     Exercising on Your Own:  -Group instruction provided by verbal instruction, power point, and written materials to support subject.  Instructors discuss  benefits of exercise, components of exercise, frequency and intensity of exercise, and end points for exercise.  Also discuss use of nitroglycerin and activating EMS.  Review options of places to exercise outside of rehab.  Review guidelines for sex with heart disease.   Cardiac Drugs I:  -Group instruction provided by verbal instruction and written materials to support subject.  Instructor reviews cardiac drug classes: antiplatelets, anticoagulants, beta blockers, and statins.  Instructor discusses reasons, side effects, and lifestyle considerations for each drug class.   Cardiac Drugs II:  -Group instruction provided by verbal instruction and written materials to support subject.  Instructor reviews cardiac drug classes: angiotensin converting enzyme inhibitors (ACE-I), angiotensin II receptor blockers (ARBs), nitrates, and calcium channel blockers.  Instructor discusses reasons, side effects, and lifestyle considerations for each drug class.   Anatomy and Physiology of the Circulatory System:  -Group instruction provided by verbal instruction, video, and written materials to support subject.  Reviews functional anatomy of heart, how it relates to various diagnoses, and what role the heart plays in the overall system.   Knowledge Questionnaire Score:     Knowledge Questionnaire Score - 06/28/16 1405      Knowledge Questionnaire Score   Pre Score 24/24      Core Components/Risk  Factors/Patient Goals at Admission:     Personal Goals and Risk Factors at Admission - 06/28/16 0836      Core Components/Risk Factors/Patient Goals on Admission    Weight Management Yes;Weight Maintenance;Weight Loss   Intervention Weight Management: Develop a combined nutrition and exercise program designed to reach desired caloric intake, while maintaining appropriate intake of nutrient and fiber, sodium and fats, and appropriate energy expenditure required for the weight goal.;Weight Management: Provide education and appropriate resources to help participant work on and attain dietary goals.;Weight Management/Obesity: Establish reasonable short term and long term weight goals.;Obesity: Provide education and appropriate resources to help participant work on and attain dietary goals.   Expected Outcomes Short Term: Continue to assess and modify interventions until short term weight is achieved;Long Term: Adherence to nutrition and physical activity/exercise program aimed toward attainment of established weight goal;Weight Maintenance: Understanding of the daily nutrition guidelines, which includes 25-35% calories from fat, 7% or less cal from saturated fats, less than 200mg  cholesterol, less than 1.5gm of sodium, & 5 or more servings of fruits and vegetables daily;Weight Loss: Understanding of general recommendations for a balanced deficit meal plan, which promotes 1-2 lb weight loss per week and includes a negative energy balance of [phone] kcal/d;Understanding of distribution of calorie intake throughout the day with the consumption of 4-5 meals/snacks;Understanding recommendations for meals to include 15-35% energy as protein, 25-35% energy from fat, 35-60% energy from carbohydrates, less than 200mg  of dietary cholesterol, 20-35 gm of total fiber daily   Increase Strength and Stamina Yes   Intervention Provide advice, education, support and counseling about physical activity/exercise needs.;Develop  an individualized exercise prescription for aerobic and resistive training based on initial evaluation findings, risk stratification, comorbidities and participant's personal goals.   Expected Outcomes Achievement of increased cardiorespiratory fitness and enhanced flexibility, muscular endurance and strength shown through measurements of functional capacity and personal statement of participant.   Improve shortness of breath with ADL's Yes   Intervention Provide education, individualized exercise plan and daily activity instruction to help decrease symptoms of SOB with activities of daily living.   Expected Outcomes Short Term: Achieves a reduction of symptoms when performing activities of daily living.   Personal Goal Other Yes  Personal Goal short: lose 8-10lbs . Get back to lifting weights. Long term: lose wt ~50lbs; back to weight lifting, return to running and improve balance/flexibity   Intervention Provide exercise programming that consist of weight training, balance and improving overall areobic capacity. Provide nutrition counseling to assist with HH diet and weightloss   Expected Outcomes Pt will return to running, lifting weights and lose weight without losing muscle tone.      Core Components/Risk Factors/Patient Goals Review:      Goals and Risk Factor Review    Row Name 07/03/16 0726 07/11/16 1718           Core Components/Risk Factors/Patient Goals Review   Personal Goals Review Other Other      Review Patient off to a good start with exercise. Pt is tolerating exercise program well      Expected Outcomes Begin regular aerobic and resistance training exercise program to help achieve health and fitness goals. Begin to increase workload as tolerated and continue with home exercise program.          Core Components/Risk Factors/Patient Goals at Discharge (Final Review):      Goals and Risk Factor Review - 07/11/16 1718      Core Components/Risk Factors/Patient Goals  Review   Personal Goals Review Other   Review Pt is tolerating exercise program well   Expected Outcomes Begin to increase workload as tolerated and continue with home exercise program.       ITP Comments:     ITP Comments    Row Name 06/28/16 0833           ITP Comments Dr. Loreta Ave, Medical Director          Comments: Marden Noble is making expected progress toward personal goals after completing 13 sessions. Recommend continued exercise and life style modification education including  stress management and relaxation techniques to decrease cardiac risk profile. Harrell Gave RN BSN

## 2016-08-03 ENCOUNTER — Encounter (HOSPITAL_COMMUNITY): Payer: 59

## 2016-08-06 ENCOUNTER — Encounter (HOSPITAL_COMMUNITY)
Admission: RE | Admit: 2016-08-06 | Discharge: 2016-08-06 | Disposition: A | Discharge status: Home / Self Care | Payer: 59 | Source: Ambulatory Visit | Attending: Cardiovascular Disease | Admitting: Cardiovascular Disease

## 2016-08-06 DIAGNOSIS — I7100 Dissection of unspecified site of aorta: Secondary | ICD-10-CM

## 2016-08-08 ENCOUNTER — Encounter (HOSPITAL_COMMUNITY)
Admission: RE | Admit: 2016-08-08 | Discharge: 2016-08-08 | Disposition: A | Discharge status: Home / Self Care | Payer: 59 | Source: Ambulatory Visit | Attending: Cardiovascular Disease | Admitting: Cardiovascular Disease

## 2016-08-08 DIAGNOSIS — I7100 Dissection of unspecified site of aorta: Secondary | ICD-10-CM

## 2016-08-09 ENCOUNTER — Encounter (HOSPITAL_COMMUNITY): Payer: Self-pay | Admitting: *Deleted

## 2016-08-10 ENCOUNTER — Encounter (HOSPITAL_COMMUNITY)
Admission: RE | Admit: 2016-08-10 | Discharge: 2016-08-10 | Disposition: A | Discharge status: Home / Self Care | Payer: 59 | Source: Ambulatory Visit | Attending: Cardiovascular Disease | Admitting: Cardiovascular Disease

## 2016-08-10 DIAGNOSIS — I7100 Dissection of unspecified site of aorta: Secondary | ICD-10-CM | POA: Diagnosis not present

## 2016-08-13 ENCOUNTER — Encounter (HOSPITAL_COMMUNITY)
Admission: RE | Admit: 2016-08-13 | Discharge: 2016-08-13 | Disposition: A | Discharge status: Home / Self Care | Payer: 59 | Source: Ambulatory Visit | Attending: Cardiovascular Disease | Admitting: Cardiovascular Disease

## 2016-08-13 DIAGNOSIS — I7100 Dissection of unspecified site of aorta: Secondary | ICD-10-CM

## 2016-08-15 ENCOUNTER — Encounter (HOSPITAL_COMMUNITY)
Admission: RE | Admit: 2016-08-15 | Discharge: 2016-08-15 | Disposition: A | Discharge status: Home / Self Care | Payer: 59 | Source: Ambulatory Visit | Attending: Cardiovascular Disease | Admitting: Cardiovascular Disease

## 2016-08-15 DIAGNOSIS — I7100 Dissection of unspecified site of aorta: Secondary | ICD-10-CM

## 2016-08-20 ENCOUNTER — Encounter (HOSPITAL_COMMUNITY)
Admission: RE | Admit: 2016-08-20 | Discharge: 2016-08-20 | Disposition: A | Discharge status: Home / Self Care | Payer: 59 | Source: Ambulatory Visit | Attending: Cardiovascular Disease | Admitting: Cardiovascular Disease

## 2016-08-20 DIAGNOSIS — I7100 Dissection of unspecified site of aorta: Secondary | ICD-10-CM

## 2016-08-22 ENCOUNTER — Encounter (HOSPITAL_COMMUNITY)
Admission: RE | Admit: 2016-08-22 | Discharge: 2016-08-22 | Disposition: A | Discharge status: Home / Self Care | Payer: 59 | Source: Ambulatory Visit | Attending: Cardiovascular Disease | Admitting: Cardiovascular Disease

## 2016-08-22 DIAGNOSIS — I7100 Dissection of unspecified site of aorta: Secondary | ICD-10-CM | POA: Diagnosis not present

## 2016-08-24 ENCOUNTER — Telehealth: Payer: Self-pay | Admitting: Cardiology

## 2016-08-24 ENCOUNTER — Encounter (HOSPITAL_COMMUNITY)
Admission: RE | Admit: 2016-08-24 | Discharge: 2016-08-24 | Disposition: A | Discharge status: Home / Self Care | Payer: 59 | Source: Ambulatory Visit | Attending: Cardiovascular Disease | Admitting: Cardiovascular Disease

## 2016-08-24 DIAGNOSIS — I7100 Dissection of unspecified site of aorta: Secondary | ICD-10-CM | POA: Insufficient documentation

## 2016-08-24 DIAGNOSIS — I713 Abdominal aortic aneurysm, ruptured: Secondary | ICD-10-CM | POA: Diagnosis not present

## 2016-08-24 NOTE — Telephone Encounter (Signed)
Pt called from cardiac rehab with Lt lateral chest pain,  Just below his P[place]. Pressure, with exercise not there at all.  Reassured though if increases more sevee to call or go to ER.

## 2016-08-24 NOTE — Progress Notes (Signed)
Jon Rodriguez report having discomfort in his left pectoral area yesterday and today. Jon Rodriguez denies having any discomfort or pain today during exercise at cardiac rehab. Jon Kicks NP called and notified. Vital signs stable. Telemetry rhythm Sinus. Oxygen saturation 97% on room air. Jon Rodriguez talked with Dr Stanford Breed and thought Jon Rodriguez's pain appears to be musculoskeletal. Jon Rodriguez left without concerns or symptoms. Will continue to monitor the patient throughout  the program.

## 2016-08-27 ENCOUNTER — Encounter (HOSPITAL_COMMUNITY)
Admission: RE | Admit: 2016-08-27 | Discharge: 2016-08-27 | Disposition: A | Discharge status: Home / Self Care | Payer: 59 | Source: Ambulatory Visit | Attending: Cardiovascular Disease | Admitting: Cardiovascular Disease

## 2016-08-27 DIAGNOSIS — I7100 Dissection of unspecified site of aorta: Secondary | ICD-10-CM | POA: Diagnosis not present

## 2016-08-29 ENCOUNTER — Encounter (HOSPITAL_COMMUNITY)
Admission: RE | Admit: 2016-08-29 | Discharge: 2016-08-29 | Disposition: A | Discharge status: Home / Self Care | Payer: 59 | Source: Ambulatory Visit | Attending: Cardiovascular Disease | Admitting: Cardiovascular Disease

## 2016-08-30 NOTE — Progress Notes (Signed)
Cardiac Individual Treatment Plan  Patient Details  Name: Waddell Schlesser MRN: [id number] Date of Birth: January 04, 1961 Referring Provider:   Flowsheet Row CARDIAC REHAB PHASE II ORIENTATION from 06/28/2016 in [place]  Referring Provider  Croitoru, Dani Gobble MD      Initial Encounter Date:  [place] from 06/28/2016 in [place]  Date  06/28/16  Referring Provider  Croitoru, Mihai MD      Visit Diagnosis: Dissection of aorta, unspecified portion of aorta ([place])  Patient's Home Medications on Admission:  Current Outpatient Prescriptions:  .  amLODipine (NORVASC) 10 MG tablet, Take 1 tablet (10 mg total) by mouth daily., Disp: 30 tablet, Rfl: 5 .  aspirin 325 MG EC tablet, Take 325 mg by mouth daily., Disp: , Rfl:  .  co-enzyme Q-10 50 MG capsule, Take 50 mg by mouth daily., Disp: , Rfl:  .  FERROUS SULFATE PO, Take 45 mg by mouth daily., Disp: , Rfl:  .  glucosamine-chondroitin 500-400 MG tablet, Take 2 tablets by mouth every morning., Disp: , Rfl:  .  losartan (COZAAR) 100 MG tablet, Take 1 tablet (100 mg total) by mouth daily., Disp: 30 tablet, Rfl: 11 .  metoprolol tartrate (LOPRESSOR) 50 MG tablet, Take 1 tablet (50 mg total) by mouth 2 (two) times daily., Disp: 60 tablet, Rfl: 5 .  Multiple Vitamins-Minerals (MULTIVITAMIN WITH MINERALS) tablet, Take 1 tablet by mouth daily., Disp: , Rfl:  .  simvastatin (ZOCOR) 20 MG tablet, Take 1 tablet by mouth daily., Disp: , Rfl:   Past Medical History: Past Medical History:  Diagnosis Date  . High cholesterol   . Hypertension     Tobacco Use: History  Smoking Status  . Former Smoker  Smokeless Tobacco  . Former Systems developer  . Types: Chew    Labs: Recent Review Flowsheet Data    Labs for ITP Cardiac and Pulmonary Rehab Latest Ref Rng & Units 04/06/2016 04/06/2016 04/07/2016 04/07/2016 06/11/2016   Cholestrol 125 - 200 mg/dL - - - - 170   LDLCALC <130 mg/dL - - - - 103   HDL >=40 mg/dL - - - - 51   Trlycerides <150 mg/dL - - - - 82   Hemoglobin A1c 4.8 - 5.6 % - - - - -   PHART 7.350 - 7.450 - 7.455(H) 7.470(H) - -   PCO2ART 35.0 - 45.0 mmHg - 56.3(H) 53.9(H) - -   HCO3 20.0 - 24.0 mEq/L - 38.5(H) 38.4(H) - -   TCO2 0 - 100 mmol/L 35 40.2 40.0 - -   ACIDBASEDEF 0.0 - 2.0 mmol/L - - - - -   O2SAT % - 92.8 89.4 59.1 -      Capillary Blood Glucose: Lab Results  Component Value Date   GLUCAP 110 (H) 04/09/2016   GLUCAP 125 (H) 04/09/2016   GLUCAP 114 (H) 04/09/2016   GLUCAP 112 (H) 04/09/2016   GLUCAP 115 (H) 04/08/2016     Exercise Target Goals:    Exercise Program Goal: Individual exercise prescription set with THRR, safety & activity barriers. Participant demonstrates ability to understand and report RPE using BORG scale, to self-measure pulse accurately, and to acknowledge the importance of the exercise prescription.  Exercise Prescription Goal: Starting with aerobic activity 30 plus minutes a day, 3 days per week for initial exercise prescription. Provide home exercise prescription and guidelines that participant acknowledges understanding prior to discharge.  Activity Barriers & Risk Stratification:  Activity Barriers & Cardiac Risk Stratification - 06/28/16 1404      Activity Barriers & Cardiac Risk Stratification   Activity Barriers None      6 Minute Walk:     6 Minute Walk    Row Name 06/28/16 1406         6 Minute Walk   Phase Initial     Distance 1508 feet     Walk Time 6 minutes     # of Rest Breaks 0     MPH 2.85     METS 3.87     RPE 12     VO2 Peak 13.5     Symptoms No     Resting HR 87 bpm     Resting BP 128/76     Max Ex. HR 102 bpm     Max Ex. BP 148/76     2 Minute Post BP 120/72        Initial Exercise Prescription:     Initial Exercise Prescription - 06/28/16 1400      Date of Initial Exercise RX and Referring Provider   Date 06/28/16   Referring Provider  Croitoru, Mihai MD     Treadmill   MPH 1.9   Grade 0   Minutes 10   METs 2.45     Bike   Level 0.8   Minutes 10   METs 2.26     NuStep   Level 3   Minutes 10   METs 2     Prescription Details   Frequency (times per week) 3   Duration Progress to 30 minutes of continuous aerobic without signs/symptoms of physical distress     Intensity   THRR 40-80% of Max Heartrate 66-132   Ratings of Perceived Exertion 11-13   Perceived Dyspnea 0-4     Progression   Progression Continue to progress workloads to maintain intensity without signs/symptoms of physical distress.     Resistance Training   Training Prescription Yes   Weight 2lbs   Reps 10-12      Perform Capillary Blood Glucose checks as needed.  Exercise Prescription Changes:     Exercise Prescription Changes    Row Name 07/03/16 0700 07/11/16 1700 07/30/16 1700 08/27/16 1600       Response to Exercise   Blood Pressure (Admit) 102/62 126/70 110/82 114/72    Blood Pressure (Exercise) 124/60 150/72 122/70 158/70    Blood Pressure (Exit) 106/64 110/62 112/64 112/64    Heart Rate (Admit) 74 bpm 78 bpm 75 bpm 76 bpm    Heart Rate (Exercise) 98 bpm 100 bpm 110 bpm 106 bpm    Heart Rate (Exit) 68 bpm 78 bpm 76 bpm 74 bpm    Rating of Perceived Exertion (Exercise) 13 11 13 13     Symptoms none none none none    Duration Progress to 30 minutes of continuous aerobic without signs/symptoms of physical distress Progress to 30 minutes of continuous aerobic without signs/symptoms of physical distress Progress to 30 minutes of continuous aerobic without signs/symptoms of physical distress Progress to 30 minutes of continuous aerobic without signs/symptoms of physical distress    Intensity THRR unchanged THRR unchanged THRR unchanged THRR unchanged      Progression   Progression Continue to progress workloads to maintain intensity without signs/symptoms of physical distress. Continue to progress workloads to maintain intensity  without signs/symptoms of physical distress. Continue to progress workloads to maintain intensity without signs/symptoms of physical distress. Continue to progress workloads  to maintain intensity without signs/symptoms of physical distress.    Average METs 2.6 2.7 3.8 3.9      Resistance Training   Training Prescription Yes Yes Yes Yes    Weight 3lbs 4lb 5lbs 5lbs    Reps 10-12 10-12 10-12 10-12      Treadmill   MPH 2.2 2.2 2.8 2.8    Grade 2 2 2 2     Minutes 10 10 10 10     METs 3.29 3.29 3.91 3.91      Bike   Level 0.8 0.8 1.8 1.8    Minutes 10 10 10 10     METs 2.26 2.26 3.8 3.8      NuStep   Level 3 4 4 5     Minutes 10 10 10 10     METs 2.2 2.6 3.7 4.2      Home Exercise Plan   Plans to continue exercise at  [facility]    Frequency  - Add 3 additional days to program exercise sessions. Add 3 additional days to program exercise sessions. Add 3 additional days to program exercise sessions.       Exercise Comments:     Exercise Comments    Row Name 07/03/16 0725 07/09/16 1004 07/11/16 1717 07/30/16 1717 08/27/16 1654   Exercise Comments Off to a good start with exercise. Reviewed home exercise guidelines. Participant is walking on treadmill at home 35 minutes T,Th, and Sat. Reviewed home exercise guidelines. Participant is walking on treadmill at home 2.0 speed, 1.5 incline for 35 minutes T,Th, and Sat. Pt continues to progress with exercise and is walking at home on treadmill for 75min/day 3 days/wk Reviewed METs and goals with pt.  Pt is progressing well with exercise.       Discharge Exercise Prescription (Final Exercise Prescription Changes):     Exercise Prescription Changes - 08/27/16 1600      Response to Exercise   Blood Pressure (Admit) 114/72   Blood Pressure (Exercise) 158/70   Blood Pressure (Exit) 112/64   Heart Rate (Admit) 76 bpm   Heart Rate (Exercise) 106 bpm   Heart Rate (Exit) 74 bpm   Rating of Perceived Exertion (Exercise) 13   Symptoms  none   Duration Progress to 30 minutes of continuous aerobic without signs/symptoms of physical distress   Intensity THRR unchanged     Progression   Progression Continue to progress workloads to maintain intensity without signs/symptoms of physical distress.   Average METs 3.9     Resistance Training   Training Prescription Yes   Weight 5lbs   Reps 10-12     Treadmill   MPH 2.8   Grade 2   Minutes 10   METs 3.91     Bike   Level 1.8   Minutes 10   METs 3.8     NuStep   Level 5   Minutes 10   METs 4.2     Home Exercise Plan   Plans to continue exercise at Home   Frequency Add 3 additional days to program exercise sessions.      Nutrition:  Target Goals: Understanding of nutrition guidelines, daily intake of sodium 1500mg , cholesterol 200mg , calories 30% from fat and 7% or less from saturated fats, daily to have 5 or more servings of fruits and vegetables.  Biometrics:     Pre Biometrics - 06/28/16 1414      Pre Biometrics   Waist Circumference 47.75 inches   Hip Circumference 45.75 inches  Waist to Hip Ratio 1.04 %   Triceps Skinfold 43 mm   % Body Fat 37.2 %   Grip Strength 39 kg   Flexibility 12.25 in   Single Leg Stand 26.67 seconds       Nutrition Therapy Plan and Nutrition Goals:     Nutrition Therapy & Goals - 07/09/16 1053      Nutrition Therapy   Diet Therapeutic Lifestyle Changes     Personal Nutrition Goals   Personal Goal #1 1-2 lb wt loss/week to a wt loss goal of 6-24 lb at graduation from [place], educate and counsel regarding individualized specific dietary modifications aiming towards targeted core components such as weight, hypertension, lipid management, diabetes, heart failure and other comorbidities.   Expected Outcomes Short Term Goal: Understand basic principles of dietary content, such as calories, fat, sodium, cholesterol and nutrients.;Long Term Goal: Adherence to  prescribed nutrition plan.      Nutrition Discharge: Nutrition Scores:     Nutrition Assessments - 06/28/16 1050      MEDFICTS Scores   Pre Score 52      Nutrition Goals Re-Evaluation:   Psychosocial: Target Goals: Acknowledge presence or absence of depression, maximize coping skills, provide positive support system. Participant is able to verbalize types and ability to use techniques and skills needed for reducing stress and depression.  Initial Review & Psychosocial Screening:     Initial Psych Review & Screening - 06/28/16 1453      Screening Interventions   Interventions Encouraged to exercise      Quality of Life Scores:     Quality of Life - 06/28/16 1415      Quality of Life Scores   Health/Function Pre 22.07 %   Socioeconomic Pre 22.36 %   Psych/Spiritual Pre 22.58 %   Family Pre 26.38 %   GLOBAL Pre 22.77 %      PHQ-9: Recent Review Flowsheet Data    Depression screen PHQ 2/9 07/02/2016   Decreased Interest 0   Down, Depressed, Hopeless 0   PHQ - 2 Score 0      Psychosocial Evaluation and Intervention:   Psychosocial Re-Evaluation:     Psychosocial Re-Evaluation    Row Name 07/05/16 1613 08/01/16 1848 08/30/16 1616         Psychosocial Re-Evaluation   Interventions Encouraged to attend Cardiac Rehabilitation for the exercise Encouraged to attend Cardiac Rehabilitation for the exercise Encouraged to attend Cardiac Rehabilitation for the exercise     Continued Psychosocial Services Needed No No No        Vocational Rehabilitation: Provide vocational rehab assistance to qualifying candidates.   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      Education: Education Goals: Education  classes will be provided on a weekly basis, covering required topics. Participant will state understanding/return demonstration of topics presented.  Learning Barriers/Preferences:     Learning Barriers/Preferences - 06/28/16 0836      Learning Barriers/Preferences   Learning Barriers Sight   Learning Preferences Pictoral;Skilled Demonstration;Written Material;Individual Instruction      Education Topics: Count Your Pulse:  -Group instruction provided by verbal instruction, demonstration, patient participation and written materials to  support subject.  Instructors address importance of being able to find your pulse and how to count your pulse when at home without a heart monitor.  Patients get hands on experience counting their pulse with staff help and individually.   Heart Attack, Angina, and Risk Factor Modification:  -Group instruction provided by verbal instruction, video, and written materials to support subject.  Instructors address signs and symptoms of angina and heart attacks.    Also discuss risk factors for heart disease and how to make changes to improve heart health risk factors.   Functional Fitness:  -Group instruction provided by verbal instruction, demonstration, patient participation, and written materials to support subject.  Instructors address safety measures for doing things around the house.  Discuss how to get up and down off the floor, how to pick things up properly, how to safely get out of a chair without assistance, and balance training.   Meditation and Mindfulness:  -Group instruction provided by verbal instruction, patient participation, and written materials to support subject.  Instructor addresses importance of mindfulness and meditation practice to help reduce stress and improve awareness.  Instructor also leads participants through a meditation exercise.    Stretching for Flexibility and Mobility:  -Group instruction provided by verbal instruction,  patient participation, and written materials to support subject.  Instructors lead participants through series of stretches that are designed to increase flexibility thus improving mobility.  These stretches are additional exercise for major muscle groups that are typically performed during regular warm up and cool down.   Hands Only CPR Anytime:  -Group instruction provided by verbal instruction, video, patient participation and written materials to support subject.  Instructors co-teach with AHA video for hands only CPR.  Participants get hands on experience with mannequins.   Nutrition I class: Heart Healthy Eating:  -Group instruction provided by PowerPoint slides, verbal discussion, and written materials to support subject matter. The instructor gives an explanation and review of the Therapeutic Lifestyle Changes diet recommendations, which includes a discussion on lipid goals, dietary fat, sodium, fiber, plant stanol/sterol esters, sugar, and the components of a well-balanced, healthy diet. Flowsheet Row CARDIAC REHAB PHASE II EXERCISE from 08/13/2016 in [place]  Date  07/31/16  Educator  RD  Instruction Review Code  2- meets goals/outcomes      Nutrition II class: Lifestyle Skills:  -Group instruction provided by PowerPoint slides, verbal discussion, and written materials to support subject matter. The instructor gives an explanation and review of label reading, grocery shopping for heart health, heart healthy recipe modifications, and ways to make healthier choices when eating out. Flowsheet Row CARDIAC REHAB PHASE II EXERCISE from 08/13/2016 in [place]  Date  08/07/16  Educator  RD  Instruction Review Code  2- meets goals/outcomes      Diabetes Question & Answer:  -Group instruction provided by PowerPoint slides, verbal discussion, and written materials to support subject matter. The instructor gives an  explanation and review of diabetes co-morbidities, pre- and post-prandial blood glucose goals, pre-exercise blood glucose goals, signs, symptoms, and treatment of hypoglycemia and hyperglycemia, and foot care basics.   Diabetes Blitz:  -Group instruction provided by PowerPoint slides, verbal discussion, and written materials to support subject matter. The instructor gives an explanation and review of the physiology behind type 1 and type 2 diabetes, diabetes medications and rational behind using different medications, pre- and post-prandial blood glucose recommendations and Hemoglobin A1c goals, diabetes diet, and exercise including  blood glucose guidelines for exercising safely.    Portion Distortion:  -Group instruction provided by PowerPoint slides, verbal discussion, written materials, and food models to support subject matter. The instructor gives an explanation of serving size versus portion size, changes in portions sizes over the last 20 years, and what consists of a serving from each food group.   Stress Management:  -Group instruction provided by verbal instruction, video, and written materials to support subject matter.  Instructors review role of stress in heart disease and how to cope with stress positively.     Exercising on Your Own:  -Group instruction provided by verbal instruction, power point, and written materials to support subject.  Instructors discuss benefits of exercise, components of exercise, frequency and intensity of exercise, and end points for exercise.  Also discuss use of nitroglycerin and activating EMS.  Review options of places to exercise outside of rehab.  Review guidelines for sex with heart disease.   Cardiac Drugs I:  -Group instruction provided by verbal instruction and written materials to support subject.  Instructor reviews cardiac drug classes: antiplatelets, anticoagulants, beta blockers, and statins.  Instructor discusses reasons, side effects, and  lifestyle considerations for each drug class.   Cardiac Drugs II:  -Group instruction provided by verbal instruction and written materials to support subject.  Instructor reviews cardiac drug classes: angiotensin converting enzyme inhibitors (ACE-I), angiotensin II receptor blockers (ARBs), nitrates, and calcium channel blockers.  Instructor discusses reasons, side effects, and lifestyle considerations for each drug class.   Anatomy and Physiology of the Circulatory System:  -Group instruction provided by verbal instruction, video, and written materials to support subject.  Reviews functional anatomy of heart, how it relates to various diagnoses, and what role the heart plays in the overall system.   Knowledge Questionnaire Score:     Knowledge Questionnaire Score - 06/28/16 1405      Knowledge Questionnaire Score   Pre Score 24/24      Core Components/Risk Factors/Patient Goals at Admission:     Personal Goals and Risk Factors at Admission - 06/28/16 0836      Core Components/Risk Factors/Patient Goals on Admission    Weight Management Yes;Weight Maintenance;Weight Loss   Intervention Weight Management: Develop a combined nutrition and exercise program designed to reach desired caloric intake, while maintaining appropriate intake of nutrient and fiber, sodium and fats, and appropriate energy expenditure required for the weight goal.;Weight Management: Provide education and appropriate resources to help participant work on and attain dietary goals.;Weight Management/Obesity: Establish reasonable short term and long term weight goals.;Obesity: Provide education and appropriate resources to help participant work on and attain dietary goals.   Expected Outcomes Short Term: Continue to assess and modify interventions until short term weight is achieved;Long Term: Adherence to nutrition and physical activity/exercise program aimed toward attainment of established weight goal;Weight  Maintenance: Understanding of the daily nutrition guidelines, which includes 25-35% calories from fat, 7% or less cal from saturated fats, less than 200mg  cholesterol, less than 1.5gm of sodium, & 5 or more servings of fruits and vegetables daily;Weight Loss: Understanding of general recommendations for a balanced deficit meal plan, which promotes 1-2 lb weight loss per week and includes a negative energy balance of [phone] kcal/d;Understanding of distribution of calorie intake throughout the day with the consumption of 4-5 meals/snacks;Understanding recommendations for meals to include 15-35% energy as protein, 25-35% energy from fat, 35-60% energy from carbohydrates, less than 200mg  of dietary cholesterol, 20-35 gm of total fiber daily   Increase  Strength and Stamina Yes   Intervention Provide advice, education, support and counseling about physical activity/exercise needs.;Develop an individualized exercise prescription for aerobic and resistive training based on initial evaluation findings, risk stratification, comorbidities and participant's personal goals.   Expected Outcomes Achievement of increased cardiorespiratory fitness and enhanced flexibility, muscular endurance and strength shown through measurements of functional capacity and personal statement of participant.   Improve shortness of breath with ADL's Yes   Intervention Provide education, individualized exercise plan and daily activity instruction to help decrease symptoms of SOB with activities of daily living.   Expected Outcomes Short Term: Achieves a reduction of symptoms when performing activities of daily living.   Personal Goal Other Yes   Personal Goal short: lose 8-10lbs . Get back to lifting weights. Long term: lose wt ~50lbs; back to weight lifting, return to running and improve balance/flexibity   Intervention Provide exercise programming that consist of weight training, balance and improving overall areobic capacity. Provide  nutrition counseling to assist with HH diet and weightloss   Expected Outcomes Pt will return to running, lifting weights and lose weight without losing muscle tone.      Core Components/Risk Factors/Patient Goals Review:      Goals and Risk Factor Review    Row Name 07/03/16 0726 07/11/16 1718 08/27/16 1654         Core Components/Risk Factors/Patient Goals Review   Personal Goals Review Other Other Other     Review Patient off to a good start with exercise. Pt is tolerating exercise program well pt is eager to go hiking again with his son.  He is currently walking 35 min/day for 2-3 days/week outside of [facility].  He states he feels like his stamina has improved since he began exercising     Expected Outcomes Begin regular aerobic and resistance training exercise program to help achieve health and fitness goals. Begin to increase workload as tolerated and continue with home exercise program.  Contine to increase workload as tolerated and continue to exercise at home.          Core Components/Risk Factors/Patient Goals at Discharge (Final Review):      Goals and Risk Factor Review - 08/27/16 1654      Core Components/Risk Factors/Patient Goals Review   Personal Goals Review Other   Review pt is eager to go hiking again with his son.  He is currently walking 35 min/day for 2-3 days/week outside of [facility].  He states he feels like his stamina has improved since he began exercising   Expected Outcomes Contine to increase workload as tolerated and continue to exercise at home.        ITP Comments:     ITP Comments    Row Name 06/28/16 [phone]           ITP Comments Dr. Loreta Ave, Medical Director          Comments: Marden Noble is making expected progress toward personal goals after completing 22 sessions. Recommend continued exercise and life style modification education including  stress management and relaxation techniques to decrease cardiac risk profile. Marden Noble is doing weill with  exercise and will be finishing up at the end of the year.[name], RN,BSN 08/30/2016 4:19 PM

## 2016-08-31 ENCOUNTER — Encounter (HOSPITAL_COMMUNITY)
Admission: RE | Admit: 2016-08-31 | Discharge: 2016-08-31 | Disposition: A | Discharge status: Home / Self Care | Payer: 59 | Source: Ambulatory Visit | Attending: Cardiovascular Disease | Admitting: Cardiovascular Disease

## 2016-08-31 DIAGNOSIS — I7100 Dissection of unspecified site of aorta: Secondary | ICD-10-CM | POA: Diagnosis not present

## 2016-09-03 ENCOUNTER — Encounter (HOSPITAL_COMMUNITY)
Admission: RE | Admit: 2016-09-03 | Discharge: 2016-09-03 | Disposition: A | Discharge status: Home / Self Care | Payer: 59 | Source: Ambulatory Visit | Attending: Cardiovascular Disease | Admitting: Cardiovascular Disease

## 2016-09-03 DIAGNOSIS — I7100 Dissection of unspecified site of aorta: Secondary | ICD-10-CM

## 2016-09-05 ENCOUNTER — Encounter (HOSPITAL_COMMUNITY): Payer: 59

## 2016-09-07 ENCOUNTER — Encounter (HOSPITAL_COMMUNITY)
Admission: RE | Admit: 2016-09-07 | Discharge: 2016-09-07 | Disposition: A | Discharge status: Home / Self Care | Payer: 59 | Source: Ambulatory Visit | Attending: Cardiovascular Disease | Admitting: Cardiovascular Disease

## 2016-09-07 DIAGNOSIS — I7100 Dissection of unspecified site of aorta: Secondary | ICD-10-CM | POA: Diagnosis not present

## 2016-09-10 ENCOUNTER — Encounter (HOSPITAL_COMMUNITY)
Admission: RE | Admit: 2016-09-10 | Discharge: 2016-09-10 | Disposition: A | Discharge status: Home / Self Care | Payer: 59 | Source: Ambulatory Visit | Attending: Cardiovascular Disease | Admitting: Cardiovascular Disease

## 2016-09-10 DIAGNOSIS — I7100 Dissection of unspecified site of aorta: Secondary | ICD-10-CM

## 2016-09-12 ENCOUNTER — Encounter (HOSPITAL_COMMUNITY)
Admission: RE | Admit: 2016-09-12 | Discharge: 2016-09-12 | Disposition: A | Discharge status: Home / Self Care | Payer: 59 | Source: Ambulatory Visit | Attending: Cardiovascular Disease | Admitting: Cardiovascular Disease

## 2016-09-12 DIAGNOSIS — I7100 Dissection of unspecified site of aorta: Secondary | ICD-10-CM

## 2016-09-14 ENCOUNTER — Encounter (HOSPITAL_COMMUNITY)
Admission: RE | Admit: 2016-09-14 | Discharge: 2016-09-14 | Disposition: A | Discharge status: Home / Self Care | Payer: 59 | Source: Ambulatory Visit | Attending: Cardiovascular Disease | Admitting: Cardiovascular Disease

## 2016-09-14 DIAGNOSIS — I7100 Dissection of unspecified site of aorta: Secondary | ICD-10-CM | POA: Diagnosis not present

## 2016-09-19 ENCOUNTER — Encounter (HOSPITAL_COMMUNITY)
Admission: RE | Admit: 2016-09-19 | Discharge: 2016-09-19 | Disposition: A | Discharge status: Home / Self Care | Payer: 59 | Source: Ambulatory Visit | Attending: Cardiovascular Disease | Admitting: Cardiovascular Disease

## 2016-09-19 DIAGNOSIS — I7100 Dissection of unspecified site of aorta: Secondary | ICD-10-CM | POA: Diagnosis not present

## 2016-09-19 NOTE — Progress Notes (Signed)
Discharge Summary  Patient Details  Name: Jon Rodriguez MRN: [id number] Date of Birth: 1961/08/07 Referring Provider:   Flowsheet Row CARDIAC REHAB PHASE II ORIENTATION from 06/28/2016 in [place]  Referring Provider  Croitoru, Mihai MD       Number of Visits: 30  Reason for Discharge:  Early Exit:  Insurance Marden Noble is independent in exercise  Smoking History:  History  Smoking Status  . Former Smoker  Smokeless Tobacco  . Former Systems developer  . Types: Chew    Diagnosis:  Dissection of aorta, unspecified portion of aorta (HCC)  ADL UCSD:   Initial Exercise Prescription:     Initial Exercise Prescription - 06/28/16 1400      Date of Initial Exercise RX and Referring Provider   Date 06/28/16   Referring Provider Croitoru, Mihai MD     Treadmill   MPH 1.9   Grade 0   Minutes 10   METs 2.45     Bike   Level 0.8   Minutes 10   METs 2.26     NuStep   Level 3   Minutes 10   METs 2     Prescription Details   Frequency (times per week) 3   Duration Progress to 30 minutes of continuous aerobic without signs/symptoms of physical distress     Intensity   THRR 40-80% of Max Heartrate 66-132   Ratings of Perceived Exertion 11-13   Perceived Dyspnea 0-4     Progression   Progression Continue to progress workloads to maintain intensity without signs/symptoms of physical distress.     Resistance Training   Training Prescription Yes   Weight 2lbs   Reps 10-12      Discharge Exercise Prescription (Final Exercise Prescription Changes):     Exercise Prescription Changes - 10/04/16 1600      Response to Exercise   Blood Pressure (Admit) 132/62   Blood Pressure (Exercise) 144/70   Blood Pressure (Exit) 114/62   Heart Rate (Admit) 73 bpm   Heart Rate (Exercise) 105 bpm   Heart Rate (Exit) 71 bpm   Rating of Perceived Exertion (Exercise) 14   Symptoms none   Duration Progress to 30 minutes of continuous aerobic without  signs/symptoms of physical distress   Intensity THRR unchanged     Progression   Progression Continue to progress workloads to maintain intensity without signs/symptoms of physical distress.   Average METs 4     Resistance Training   Training Prescription Yes   Weight 6lbs   Reps 10-12     Treadmill   MPH 2.8   Grade 2   Minutes 10   METs 3.91     Bike   Level 1.8   Minutes 10   METs 3.8     NuStep   Level 6   Minutes 10   METs 4.3     Home Exercise Plan   Plans to continue exercise at Home   Frequency Add 3 additional days to program exercise sessions.      Functional Capacity:     6 Minute Walk    Row Name 06/28/16 1406 10/03/16 0832       6 Minute Walk   Phase Initial Discharge    Distance 1508 feet 1934 feet    Distance % Change  - 28.2 %    Walk Time 6 minutes 6 minutes    # of Rest Breaks 0 0    MPH 2.85 3.7  METS 3.87 4.7    RPE 12 10    VO2 Peak 13.5 16.4    Symptoms No No    Resting HR 87 bpm 78 bpm    Resting BP 128/76 102/64    Max Ex. HR 102 bpm 110 bpm    Max Ex. BP 148/76 142/72    2 Minute Post BP 120/72 126/68       Psychological, QOL, Others - Outcomes: PHQ 2/9: Depression screen [facility] 2/9 09/19/2016 07/02/2016  Decreased Interest 0 0  Down, Depressed, Hopeless 0 0  PHQ - 2 Score 0 0    Quality of Life:     Quality of Life - 10/04/16 1633      Quality of Life Scores   Health/Function Pre 22.07 %   Health/Function Post 24.7 %   Health/Function % Change 11.92 %   Socioeconomic Pre 22.36 %   Socioeconomic Post 24.5 %   Socioeconomic % Change  9.57 %   Psych/Spiritual Pre 22.58 %   Psych/Spiritual Post 22.42 %   Psych/Spiritual % Change -0.71 %   Family Pre 26.38 %   Family Post 26.38 %   Family % Change 0 %   GLOBAL Pre 22.77 %   GLOBAL Post 24.44 %   GLOBAL % Change 7.33 %      Personal Goals: Goals established at orientation with interventions provided to work toward goal.     Personal Goals and Risk  Factors at Admission - 06/28/16 0836      Core Components/Risk Factors/Patient Goals on Admission    Weight Management Yes;Weight Maintenance;Weight Loss   Intervention Weight Management: Develop a combined nutrition and exercise program designed to reach desired caloric intake, while maintaining appropriate intake of nutrient and fiber, sodium and fats, and appropriate energy expenditure required for the weight goal.;Weight Management: Provide education and appropriate resources to help participant work on and attain dietary goals.;Weight Management/Obesity: Establish reasonable short term and long term weight goals.;Obesity: Provide education and appropriate resources to help participant work on and attain dietary goals.   Expected Outcomes Short Term: Continue to assess and modify interventions until short term weight is achieved;Long Term: Adherence to nutrition and physical activity/exercise program aimed toward attainment of established weight goal;Weight Maintenance: Understanding of the daily nutrition guidelines, which includes 25-35% calories from fat, 7% or less cal from saturated fats, less than 291m cholesterol, less than 1.5gm of sodium, & 5 or more servings of fruits and vegetables daily;Weight Loss: Understanding of general recommendations for a balanced deficit meal plan, which promotes 1-2 lb weight loss per week and includes a negative energy balance of [phone] kcal/d;Understanding of distribution of calorie intake throughout the day with the consumption of 4-5 meals/snacks;Understanding recommendations for meals to include 15-35% energy as protein, 25-35% energy from fat, 35-60% energy from carbohydrates, less than 2027mof dietary cholesterol, 20-35 gm of total fiber daily   Increase Strength and Stamina Yes   Intervention Provide advice, education, support and counseling about physical activity/exercise needs.;Develop an individualized exercise prescription for aerobic and resistive  training based on initial evaluation findings, risk stratification, comorbidities and participant's personal goals.   Expected Outcomes Achievement of increased cardiorespiratory fitness and enhanced flexibility, muscular endurance and strength shown through measurements of functional capacity and personal statement of participant.   Improve shortness of breath with ADL's Yes   Intervention Provide education, individualized exercise plan and daily activity instruction to help decrease symptoms of SOB with activities of daily living.   Expected Outcomes  Short Term: Achieves a reduction of symptoms when performing activities of daily living.   Personal Goal Other Yes   Personal Goal short: lose 8-10lbs . Get back to lifting weights. Long term: lose wt ~50lbs; back to weight lifting, return to running and improve balance/flexibity   Intervention Provide exercise programming that consist of weight training, balance and improving overall areobic capacity. Provide nutrition counseling to assist with HH diet and weightloss   Expected Outcomes Pt will return to running, lifting weights and lose weight without losing muscle tone.       Personal Goals Discharge:     Goals and Risk Factor Review    Row Name 07/03/16 0726 07/11/16 1718 08/27/16 1654 09/14/16 1219 10/08/16 1540     Core Components/Risk Factors/Patient Goals Review   Personal Goals Review Other Other Other Other Weight Management/Obesity   Review Patient off to a good start with exercise. Pt is tolerating exercise program well pt is eager to go hiking again with his son.  He is currently walking 35 min/day for 2-3 days/week outside of [facility].  He states he feels like his stamina has improved since he began exercising Pt is doing well with exercise in [place] and at home.  Encouraged to continue routine (5 days/week) after graduating the program next week.   Pt has lost 4.4 lb during CR.   Expected Outcomes Begin regular aerobic and resistance  training exercise program to help achieve health and fitness goals. Begin to increase workload as tolerated and continue with home exercise program.  Contine to increase workload as tolerated and continue to exercise at home.   Contine to increase workload as tolerated and continue to exercise at home.   Continue Heart Healthy diet and lifestyle choices to promote wt loss goals.      Nutrition & Weight - Outcomes:     Pre Biometrics - 06/28/16 1414      Pre Biometrics   Waist Circumference 47.75 inches   Hip Circumference 45.75 inches   Waist to Hip Ratio 1.04 %   Triceps Skinfold 43 mm   % Body Fat 37.2 %   Grip Strength 39 kg   Flexibility 12.25 in   Single Leg Stand 26.67 seconds         Post Biometrics - 10/03/16 0834       Post  Biometrics   Height '6\' 1"'  (1.854 m)   Weight 259 lb 14.8 oz (117.9 kg)   Waist Circumference 45.5 inches   Hip Circumference 46 inches   Waist to Hip Ratio 0.99 %   BMI (Calculated) 34.4   Triceps Skinfold 40 mm   % Body Fat 35.6 %   Grip Strength 52 kg   Flexibility 14 in   Single Leg Stand 30 seconds      Nutrition:     Nutrition Therapy & Goals - 07/09/16 1053      Nutrition Therapy   Diet Therapeutic Lifestyle Changes     Personal Nutrition Goals   Personal Goal #1 1-2 lb wt loss/week to a wt loss goal of 6-24 lb at graduation from [place], educate and counsel regarding individualized specific dietary modifications aiming towards targeted core components such as weight, hypertension, lipid management, diabetes, heart failure and other comorbidities.   Expected Outcomes Short Term Goal: Understand basic principles of dietary content, such as calories, fat, sodium, cholesterol and nutrients.;Long Term Goal: Adherence to prescribed nutrition plan.  Nutrition Discharge:     Nutrition Assessments - 10/08/16 1538      MEDFICTS Scores   Pre Score 52   Post Score 36    Score Difference -16      Education Questionnaire Score:     Knowledge Questionnaire Score - 10/04/16 1631      Knowledge Questionnaire Score   Post Score 23/24      Goals reviewed with patient; copy given to patient.[name] graduated from cardiac rehab program today with completion of 30 exercise sessions in Phase II. Pt maintained good attendance and progressed nicely during his participation in rehab as evidenced by increased MET level.   Medication list reconciled. Repeat  PHQ score- 0 .  Marden Noble has made significant lifestyle changes and should be commended for his success. Pt feels he has achieved his goals during cardiac rehab.   [name] plans to continue exercise at the gym. We are proud of [name]'s progress. [name]'s blood pressures have been stable he has done well with exercise. Marden Noble has lost 4 pounds since he has been participating in cardiac rehab. Harrell Gave RN BSN

## 2016-09-20 ENCOUNTER — Other Ambulatory Visit: Payer: Self-pay | Admitting: Cardiothoracic Surgery

## 2016-09-20 DIAGNOSIS — I7101 Dissection of thoracic aorta: Secondary | ICD-10-CM

## 2016-09-20 DIAGNOSIS — I71019 Dissection of thoracic aorta, unspecified: Secondary | ICD-10-CM

## 2016-09-21 ENCOUNTER — Encounter (HOSPITAL_COMMUNITY)
Admission: RE | Admit: 2016-09-21 | Discharge: 2016-09-21 | Disposition: A | Discharge status: Home / Self Care | Payer: 59 | Source: Ambulatory Visit | Attending: Cardiovascular Disease | Admitting: Cardiovascular Disease

## 2016-09-21 VITALS — Ht 73.0 in | Wt 259.9 lb

## 2016-09-21 DIAGNOSIS — I7100 Dissection of unspecified site of aorta: Secondary | ICD-10-CM

## 2016-10-08 ENCOUNTER — Other Ambulatory Visit: Payer: Self-pay

## 2016-10-08 DIAGNOSIS — I7101 Dissection of thoracic aorta: Secondary | ICD-10-CM

## 2016-10-08 DIAGNOSIS — I71019 Dissection of thoracic aorta, unspecified: Secondary | ICD-10-CM

## 2016-10-08 DIAGNOSIS — D62 Acute posthemorrhagic anemia: Secondary | ICD-10-CM

## 2016-10-08 DIAGNOSIS — Z9889 Other specified postprocedural states: Secondary | ICD-10-CM

## 2016-10-08 DIAGNOSIS — I10 Essential (primary) hypertension: Secondary | ICD-10-CM

## 2016-10-08 MED ORDER — METOPROLOL TARTRATE 50 MG PO TABS: 50.0000 mg | ORAL_TABLET | Freq: Two times a day (BID) | ORAL | 2 refills | Status: DC

## 2016-10-08 MED ORDER — AMLODIPINE BESYLATE 10 MG PO TABS: 10.0000 mg | ORAL_TABLET | Freq: Every day | ORAL | 2 refills | Status: DC

## 2016-10-08 MED ORDER — LOSARTAN POTASSIUM 100 MG PO TABS: 100.0000 mg | ORAL_TABLET | Freq: Every day | ORAL | 2 refills | Status: DC

## 2016-10-08 NOTE — Addendum Note (Signed)
Encounter addended by: Jewel Baize, RD on: 10/08/2016  3:41 PM<BR>    Actions taken: Flowsheet data copied forward, Visit Navigator Flowsheet section accepted

## 2016-10-08 NOTE — Telephone Encounter (Signed)
Rx(s) sent to pharmacy electronically.  

## 2016-10-24 ENCOUNTER — Ambulatory Visit
Admission: RE | Admit: 2016-10-24 | Discharge: 2016-10-24 | Disposition: A | Discharge status: Home / Self Care | Payer: 59 | Source: Ambulatory Visit | Attending: Cardiothoracic Surgery | Admitting: Cardiothoracic Surgery

## 2016-10-24 ENCOUNTER — Other Ambulatory Visit: Payer: 59

## 2016-10-24 ENCOUNTER — Ambulatory Visit: Payer: 59 | Admitting: Cardiothoracic Surgery

## 2016-10-24 DIAGNOSIS — I71019 Dissection of thoracic aorta, unspecified: Secondary | ICD-10-CM

## 2016-10-24 DIAGNOSIS — I7101 Dissection of thoracic aorta: Secondary | ICD-10-CM

## 2016-10-24 MED ORDER — IOPAMIDOL (ISOVUE-370) INJECTION 76%
75.0000 mL | Freq: Once | INTRAVENOUS | Status: AC | PRN
  Administered 2016-10-24: 75 mL via INTRAVENOUS

## 2016-11-07 ENCOUNTER — Encounter: Payer: Self-pay | Admitting: Cardiothoracic Surgery

## 2016-11-07 ENCOUNTER — Ambulatory Visit (INDEPENDENT_AMBULATORY_CARE_PROVIDER_SITE_OTHER): Payer: 59 | Admitting: Cardiothoracic Surgery

## 2016-11-07 VITALS — BP 118/53 | HR 76 | Resp 20 | Ht 73.0 in | Wt 259.0 lb

## 2016-11-07 DIAGNOSIS — I7101 Dissection of thoracic aorta: Secondary | ICD-10-CM

## 2016-11-07 DIAGNOSIS — I71019 Dissection of thoracic aorta, unspecified: Secondary | ICD-10-CM

## 2016-11-07 NOTE — Progress Notes (Signed)
PCP is Melinda Crutch, MD Referring Provider is Lacretia Leigh, MD  Chief Complaint  Patient presents with  . 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 Patient feels well. He is exercising 5-6 times a week mainly aerobic program with minimal weights-less than 30 pounds. He is losing 1-2 pounds per week and has no symptoms of chest pain or heart failure.  The patient had a hemashield straight graft repair of his ascending aorta to the proximal arch at the innominate artery. His echo last month shows moderate AI and he has a diastolic murmur on exam. He is in a sinus rhythm and his blood pressure is fairly well controlled on triple drug therapy-losartan, metoprolol, amlodipine. 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  Both his moderate aortic insufficiency and arch dilatation only be followed with serial echocardiogram and serial CT scans. If his blood pressure is well-controlled these 2 problems should be well tolerated.   Past Medical History:  Diagnosis Date  . High cholesterol   . Hypertension     Past Surgical History:  Procedure Laterality Date  . COLONOSCOPY  2012  . REPLACEMENT ASCENDING AORTA N/A 03/31/2016   Procedure: REPLACEMENT OF ASCENDING AORTA USING A 15mm HEMASHIELD PLATINUM VASCULAR GRAFT;  Surgeon: Ivin Poot, MD;  Location: [facility];  Service: Open Heart Surgery;  Laterality: N/A;    Family History  Problem Relation Age of Onset  . Hypertension      SIblings  . CAD Mother     CABG  . Hyperlipidemia Mother   . Hypertension Father   . Prostate cancer Father   . Hyperlipidemia Father     Social History Social History  Substance Use Topics  . Smoking  status: Former Research scientist (life sciences)  . Smokeless tobacco: Former Systems developer    Types: Chew  . Alcohol use No    Current Outpatient Prescriptions  Medication Sig Dispense Refill  . amLODipine (NORVASC) 10 MG tablet Take 1 tablet (10 mg total) by mouth daily. 90 tablet 2  . aspirin 325 MG EC tablet Take 325 mg by mouth daily.    Marland Kitchen co-enzyme Q-10 50 MG capsule Take 50 mg by mouth daily.    Marland Kitchen FERROUS SULFATE PO Take 45 mg by mouth daily.    Marland Kitchen glucosamine-chondroitin 500-400 MG tablet Take 2 tablets by mouth every morning.    Marland Kitchen losartan (COZAAR) 100 MG tablet Take 1 tablet (100 mg total) by mouth daily. 90 tablet 2  . metoprolol (LOPRESSOR) 50 MG tablet Take 1 tablet (50 mg total) by mouth 2 (two) times daily. 180 tablet 2  . Multiple Vitamins-Minerals (MULTIVITAMIN WITH MINERALS) tablet Take 1 tablet by mouth daily.    . simvastatin (ZOCOR) 20 MG tablet Take 1 tablet by mouth daily.     No current facility-administered medications for this visit.     No Known Allergies  Review of Systems  Improved energy Walking 2 miles a day No edema  BP (!) 118/53   Pulse 76   Resp 20   Ht 6\' 1"  (1.854 m)   Wt 259 lb (117.5 kg)   SpO2 98% Comment: RA  BMI 34.17 kg/m  Physical Exam  Exam    General- alert and comfortable   Lungs- clear without rales, wheezes   Cor- regular rate and rhythm, 2/6 diastolic murmur ,  nogallop   Abdomen- soft, non-tender   Extremities - warm, non-tender, minimal edema. Left radial pulse slightly reduced compared to right radial pulse   Neuro- oriented, appropriate, no focal weakness   Diagnostic Tests: CTA personally reviewed and counseled with patient The graft repair is intact without false aneurysm. The mid to distal arch remains dilated at 6 cm diameter and the dissection flap continues at the area of the left subclavian artery  Impression: Blood pressure control and avoidance of heavy lifting are important to the patient's cardiovascular health and he understands  this.  Plan: I'll see the patient back in 8 months with a CT of his thoracic aorta. He will continue his current antihypertensive medications and current exercise program.   Len Childs, MD [facility] [phone]

## 2017-03-25 ENCOUNTER — Encounter: Payer: Self-pay | Admitting: Cardiovascular Disease

## 2017-03-28 ENCOUNTER — Other Ambulatory Visit: Payer: Self-pay | Admitting: *Deleted

## 2017-03-28 DIAGNOSIS — I71019 Dissection of thoracic aorta, unspecified: Secondary | ICD-10-CM

## 2017-03-28 DIAGNOSIS — Z9889 Other specified postprocedural states: Secondary | ICD-10-CM

## 2017-03-28 DIAGNOSIS — I7101 Dissection of thoracic aorta: Secondary | ICD-10-CM

## 2017-03-28 DIAGNOSIS — D62 Acute posthemorrhagic anemia: Secondary | ICD-10-CM

## 2017-03-28 MED ORDER — AMLODIPINE BESYLATE 5 MG PO TABS: 5.0000 mg | ORAL_TABLET | Freq: Every day | ORAL | 3 refills | Status: DC

## 2017-05-29 ENCOUNTER — Other Ambulatory Visit: Payer: Self-pay | Admitting: Cardiovascular Disease

## 2017-05-29 DIAGNOSIS — I71019 Dissection of thoracic aorta, unspecified: Secondary | ICD-10-CM

## 2017-05-29 DIAGNOSIS — Z9889 Other specified postprocedural states: Secondary | ICD-10-CM

## 2017-05-29 DIAGNOSIS — D62 Acute posthemorrhagic anemia: Secondary | ICD-10-CM

## 2017-05-29 DIAGNOSIS — I7101 Dissection of thoracic aorta: Secondary | ICD-10-CM

## 2017-05-29 DIAGNOSIS — I10 Essential (primary) hypertension: Secondary | ICD-10-CM

## 2017-06-21 ENCOUNTER — Other Ambulatory Visit: Payer: Self-pay | Admitting: Cardiothoracic Surgery

## 2017-06-21 DIAGNOSIS — I7101 Dissection of thoracic aorta: Secondary | ICD-10-CM

## 2017-06-21 DIAGNOSIS — I71019 Dissection of thoracic aorta, unspecified: Secondary | ICD-10-CM

## 2017-07-18 ENCOUNTER — Other Ambulatory Visit: Payer: Self-pay

## 2017-07-18 ENCOUNTER — Ambulatory Visit (HOSPITAL_COMMUNITY): Payer: 59 | Attending: Cardiovascular Disease

## 2017-07-18 ENCOUNTER — Encounter: Payer: Self-pay | Admitting: *Deleted

## 2017-07-18 DIAGNOSIS — I119 Hypertensive heart disease without heart failure: Secondary | ICD-10-CM | POA: Diagnosis not present

## 2017-07-18 DIAGNOSIS — I351 Nonrheumatic aortic (valve) insufficiency: Secondary | ICD-10-CM | POA: Diagnosis not present

## 2017-07-18 DIAGNOSIS — D649 Anemia, unspecified: Secondary | ICD-10-CM | POA: Diagnosis not present

## 2017-07-18 DIAGNOSIS — I080 Rheumatic disorders of both mitral and aortic valves: Secondary | ICD-10-CM | POA: Diagnosis not present

## 2017-07-18 DIAGNOSIS — I77810 Thoracic aortic ectasia: Secondary | ICD-10-CM | POA: Diagnosis not present

## 2017-07-18 DIAGNOSIS — I359 Nonrheumatic aortic valve disorder, unspecified: Secondary | ICD-10-CM | POA: Diagnosis present

## 2017-07-18 NOTE — Telephone Encounter (Signed)
This encounter was created in error - please disregard.

## 2017-07-18 NOTE — Telephone Encounter (Signed)
-----   Message from Sanda Klein, MD sent at 07/18/2017 11:26 AM EDT ----- Moderate leak in the aortic valve, unchanged from a year ago. He has CT of the aorta scheduled for the end of the month, which will offer more accurate measurement of the aortic root compared to his echocardiogram.

## 2017-07-23 ENCOUNTER — Ambulatory Visit (INDEPENDENT_AMBULATORY_CARE_PROVIDER_SITE_OTHER): Payer: 59 | Admitting: Cardiovascular Disease

## 2017-07-23 ENCOUNTER — Encounter: Payer: Self-pay | Admitting: Cardiovascular Disease

## 2017-07-23 VITALS — BP 128/52 | HR 54 | Ht 73.0 in | Wt 210.0 lb

## 2017-07-23 DIAGNOSIS — E785 Hyperlipidemia, unspecified: Secondary | ICD-10-CM

## 2017-07-23 DIAGNOSIS — Z9889 Other specified postprocedural states: Secondary | ICD-10-CM

## 2017-07-23 DIAGNOSIS — I351 Nonrheumatic aortic (valve) insufficiency: Secondary | ICD-10-CM | POA: Diagnosis not present

## 2017-07-23 DIAGNOSIS — Z8679 Personal history of other diseases of the circulatory system: Secondary | ICD-10-CM | POA: Diagnosis not present

## 2017-07-23 DIAGNOSIS — Z79899 Other long term (current) drug therapy: Secondary | ICD-10-CM | POA: Diagnosis not present

## 2017-07-23 DIAGNOSIS — I10 Essential (primary) hypertension: Secondary | ICD-10-CM | POA: Diagnosis not present

## 2017-07-23 DIAGNOSIS — E663 Overweight: Secondary | ICD-10-CM

## 2017-07-23 LAB — COMPREHENSIVE METABOLIC PANEL
A/G RATIO: 1.8 (ref 1.2–2.2)
ALBUMIN: 4.2 g/dL (ref 3.5–5.5)
ALK PHOS: 71 IU/L (ref 39–117)
ALT: 28 IU/L (ref 0–44)
AST: 23 IU/L (ref 0–40)
BUN / CREAT RATIO: 12 (ref 9–20)
BUN: 12 mg/dL (ref 6–24)
Bilirubin Total: 0.7 mg/dL (ref 0.0–1.2)
CALCIUM: 10 mg/dL (ref 8.7–10.2)
CO2: 24 mmol/L (ref 20–29)
CREATININE: 1.02 mg/dL (ref 0.76–1.27)
Chloride: 102 mmol/L (ref 96–106)
GFR calc Af Amer: 95 mL/min/{1.73_m2} (ref >59)
GFR, EST NON AFRICAN AMERICAN: 82 mL/min/{1.73_m2} (ref >59)
GLOBULIN, TOTAL: 2.4 g/dL (ref 1.5–4.5)
Glucose: 100 mg/dL — ABNORMAL HIGH (ref 65–99)
POTASSIUM: 4.7 mmol/L (ref 3.5–5.2)
SODIUM: 141 mmol/L (ref 134–144)
Total Protein: 6.6 g/dL (ref 6.0–8.5)

## 2017-07-23 LAB — LIPID PANEL
CHOL/HDL RATIO: 2.7 ratio (ref 0.0–5.0)
Cholesterol, Total: 192 mg/dL (ref 100–199)
HDL: 70 mg/dL (ref >39)
LDL CALC: 109 mg/dL — AB (ref 0–99)
TRIGLYCERIDES: 67 mg/dL (ref 0–149)
VLDL Cholesterol Cal: 13 mg/dL (ref 5–40)

## 2017-07-23 MED ORDER — HYDROCHLOROTHIAZIDE 12.5 MG PO CAPS: 12.5000 mg | ORAL_CAPSULE | ORAL | 3 refills | Status: DC

## 2017-07-23 NOTE — Progress Notes (Signed)
Cardiology Office Note    Date:  07/23/2017   ID:  Jon Rodriguez, DOB Sep 15, 1961, MRN [id number]  PCP:  Lawerance Cruel, MD  Cardiologist:   Sanda Klein, MD   No chief complaint on file.   History of Present Illness:  Jon Rodriguez is a 56 y.o. 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 He required transient postoperative intra-aortic balloon pump support for postoperative RV dysfunction.  He has moderate aortic insufficiency.  He has done remarkably well in the roughly 16 months that have passed since his surgery.  He is back in the gym 5 days a week and enjoys physical activity.  He typically spends 40-45 minutes on the treadmill at 4 miles an hour and also lifts weights.  He keeps a weight load low so that he can perform 10-20 reps without stopping and without holding his breath.  He has an occasional twinge of discomfort in his right posterior chest, but also believes that he is hypervigilant ever since the episode of dissection.  The discomfort can occur both during activity and while simply sitting down without activity.  He believes it is gradually improving as time goes by.  He has lost about 50 pounds since February, 73 pounds since July 2017.  Home blood pressure monitor has been showing escalating blood pressure, systolic blood pressure in the 140s and 150s, although the systolic blood pressure is consistently in the low to mid 50s.  His heart rate is in the 45-55 range.  During exercise he avoids letting his heart increase over 90 bpm.  He checked his home blood pressure monitor in our office today.  There is a 20 mmHg overestimation with his home monitor.  There is no significant difference between the right and left upper extremities when the blood pressures  checked with the same device.  He is due a follow-up CT angiogram of the aorta in a few days.  He had an echocardiogram performed just before this visit that still shows moderate aortic insufficiency but with normal left ventricular systolic function and no evidence of left ventricular dilation.  The echocardiogram described "distal septal and distal inferior wall hypokinesis".  I reviewed the images and do not appreciate any regional wall motion abnormality.  Left ventricular size is at the upper limit of normal at 57 mm (has increased from last year), the end-systolic diameter is completely normal at 33 mm.  Aortic root is measured at 47 mm but more accurately evaluated on the CT.  Past Medical History:  Diagnosis Date  . High cholesterol   . Hypertension     Past Surgical History:  Procedure Laterality Date  . COLONOSCOPY  2012  . REPLACEMENT ASCENDING AORTA N/A 03/31/2016   Procedure: REPLACEMENT OF ASCENDING AORTA USING A 30mm HEMASHIELD PLATINUM VASCULAR GRAFT;  Surgeon: Ivin Poot, MD;  Location: [place];  Service: Open Heart Surgery;  Laterality: N/A;    Current Medications: Outpatient Medications Prior to Visit  Medication Sig Dispense Refill  . amLODipine (NORVASC) 10 MG tablet TAKE 1 TABLET BY MOUTH  DAILY 90 tablet 1  . aspirin 325 MG EC tablet Take 325 mg by mouth daily.    Marland Kitchen co-enzyme Q-10 50 MG capsule Take 50 mg by mouth daily.    Marland Kitchen FERROUS SULFATE  PO Take 45 mg by mouth 3 (three) times a week.     Marland Kitchen glucosamine-chondroitin 500-400 MG tablet Take 2 tablets by mouth every morning.    Marland Kitchen losartan (COZAAR) 100 MG tablet TAKE 1 TABLET BY MOUTH  DAILY 90 tablet 1  . metoprolol tartrate (LOPRESSOR) 50 MG tablet TAKE 1 TABLET BY MOUTH TWO  TIMES DAILY 180 tablet 1  . Multiple Vitamins-Minerals (MULTIVITAMIN WITH MINERALS) tablet Take 1 tablet by mouth daily.    . simvastatin (ZOCOR) 20 MG tablet Take 1 tablet by mouth daily.    Marland Kitchen amLODipine (NORVASC) 5 MG tablet Take 1 tablet (5  mg total) by mouth daily. (Patient not taking: Reported on 07/23/2017) 90 tablet 3   No facility-administered medications prior to visit.      Allergies:   Patient has no known allergies.   Social History   Social History  . Marital status: Divorced    Spouse name: N/A  . Number of children: N/A  . Years of education: N/A   Social History Main Topics  . Smoking status: Former Research scientist (life sciences)  . Smokeless tobacco: Former Systems developer    Types: Chew  . Alcohol use No  . Drug use: Unknown  . Sexual activity: Not Asked   Other Topics Concern  . None   Social History Narrative  . None     Family History:  The patient's family history includes CAD in his mother; Hyperlipidemia in his father and mother; Hypertension in his father and unknown relative; Prostate cancer in his father.   ROS:   Please see the history of present illness.    ROS All other systems reviewed and are negative.   PHYSICAL EXAM:   VS:  BP (!) 128/52   Pulse (!) 54   Ht 6\' 1"  (1.854 m)   Wt 210 lb (95.3 kg)   SpO2 98%   BMI 27.71 kg/m    Right arm blood pressure 128/52, left arm blood pressure 132/56  General: Alert, oriented x3, no distress, looks fit Head: no evidence of trauma, PERRL, EOMI, no exophtalmos or lid lag, no myxedema, no xanthelasma; normal ears, nose and oropharynx Neck: normal jugular venous pulsations and no hepatojugular reflux; brisk carotid pulses without delay and left carotid bruit Chest: clear to auscultation, no signs of consolidation by percussion or palpation, normal fremitus, symmetrical and full respiratory excursions Cardiovascular: normal position and quality of the apical impulse, regular rhythm, normal first and second heart sounds, no rubs or gallops, 2-3/6 aortic ejection murmur is early peaking, 3/6 decrescendo aortic insufficiency murmur heard at the right upper sternal border and left lower sternal border Abdomen: no tenderness or distention, no masses by palpation, no abnormal  pulsatility or arterial bruits, normal bowel sounds, no hepatosplenomegaly Extremities: no clubbing, cyanosis or edema; 2+ radial, ulnar and brachial pulses bilaterally; 2+ right femoral, posterior tibial and dorsalis pedis pulses; 2+ left femoral, posterior tibial and dorsalis pedis pulses; no subclavian or femoral bruits Neurological: grossly nonfocal Psych: Normal mood and affect   Wt Readings from Last 3 Encounters:  07/23/17 210 lb (95.3 kg)  11/07/16 259 lb (117.5 kg)  10/03/16 259 lb 14.8 oz (117.9 kg)      Studies/Labs Reviewed:   EKG:  EKG is ordered today.  Shows sinus bradycardia, rightward axis, left ventricular hypertrophy with repolarization changes in the lateral leads, mildly prolonged QTC at 476 ms (less than a year ago). Recent Labs: No results found for requested labs within last 8760 hours.  ASSESSMENT:    1. Hx of repair of dissecting thoracic aortic aneurysm, Stanford type A   2. Nonrheumatic aortic valve insufficiency   3. Essential hypertension   4. Dyslipidemia   5. Overweight (BMI 25.0-29.9)   6. Medication management      PLAN:  In order of problems listed above:  1. S/P Asc Ao Dissection: Now about 16 months after surgical repair. No evidence of limb or end organ ischemia by history and exam. Asymptomatic, actively engaged in equal exercise 5 days a week.  He has a left carotid bruit that I do not recall hearing at his last appointment.  The carotid pulse is full and not delayed.  My impression is that the bruit is radiating from the chest.  There is also a possibility that he has progression of the dissection into his left carotid artery.  We should be able to see that on the upcoming CT angiogram.  Consider dedicated ultrasonography if a diagnosis is not apparent. 2. AI: Moderate, but not severe and without signs of left ventricular dysfunction.  Left ventricular end-diastolic diameter is at the upper limit of normal, but end-systolic dimensions are  normal.  Reevaluate by echo on a yearly basis. Very distinct murmur on exam.  3. HTN: At target blood pressure 120s/70s or less, checked with our office manometer.  It appears that his home blood pressure monitor overestimates his systolic blood pressure by about 20 points.  Advised that he try to get a new device. 4. HLP: time to recheck lipid profile. 5. Obesity: To be thoroughly congratulated for his commitment to regular physical activity, healthy diet and weight loss.  He has gone from severely obese to mildly overweight in a years time.    Medication Adjustments/Labs and Tests Ordered: Current medicines are reviewed at length with the patient today.  Concerns regarding medicines are outlined above.  Medication changes, Labs and Tests ordered today are listed in the Patient Instructions below. Patient Instructions  Medication Instructions: Dr Sallyanne Kuster recommends that you continue on your current medications as directed. Please refer to the Current Medication list given to you today.  Labwork: Your physician recommends that you return for lab work TODAY.  Testing/Procedures: 1. Echocardiogram in 1 YEAR - Your physician has requested that you have an echocardiogram. Echocardiography is a painless test that uses sound waves to create images of your heart. It provides your doctor with information about the size and shape of your heart and how well your heart's chambers and valves are working. This procedure takes approximately one hour. There are no restrictions for this procedure. **This will be completed at our [facility] location - [address], [address]  Follow-up: Dr Sallyanne Kuster recommends that you schedule a follow-up appointment in 12 months. You will receive a reminder letter in the mail two months in advance. If you don't receive a letter, please call our office to schedule the follow-up appointment.  If you need a refill on your cardiac medications before your next appointment,  please call your pharmacy.    Signed, Sanda Klein, MD  07/23/2017 2:53 PM    [address], [place], [place]  [address] Phone: [phone]; Fax: [phone]

## 2017-07-23 NOTE — Patient Instructions (Addendum)
Medication Instructions: Dr Sallyanne Kuster recommends that you continue on your current medications as directed. Please refer to the Current Medication list given to you today.  Labwork: Your physician recommends that you return for lab work TODAY.  Testing/Procedures: 1. Echocardiogram in 1 YEAR - Your physician has requested that you have an echocardiogram. Echocardiography is a painless test that uses sound waves to create images of your heart. It provides your doctor with information about the size and shape of your heart and how well your heart's chambers and valves are working. This procedure takes approximately one hour. There are no restrictions for this procedure. **This will be completed at our [facility] location - [address]  Follow-up: Dr Sallyanne Kuster recommends that you schedule a follow-up appointment in 12 months. You will receive a reminder letter in the mail two months in advance. If you don't receive a letter, please call our office to schedule the follow-up appointment.  If you need a refill on your cardiac medications before your next appointment, please call your pharmacy.

## 2017-07-24 ENCOUNTER — Ambulatory Visit (INDEPENDENT_AMBULATORY_CARE_PROVIDER_SITE_OTHER): Payer: 59 | Admitting: Cardiothoracic Surgery

## 2017-07-24 ENCOUNTER — Encounter: Payer: Self-pay | Admitting: Cardiothoracic Surgery

## 2017-07-24 ENCOUNTER — Ambulatory Visit
Admission: RE | Admit: 2017-07-24 | Discharge: 2017-07-24 | Disposition: A | Discharge status: Home / Self Care | Payer: 59 | Source: Ambulatory Visit | Attending: Cardiothoracic Surgery | Admitting: Cardiothoracic Surgery

## 2017-07-24 VITALS — BP 154/58 | HR 54 | Ht 73.0 in | Wt 208.0 lb

## 2017-07-24 DIAGNOSIS — I7101 Dissection of thoracic aorta: Secondary | ICD-10-CM

## 2017-07-24 DIAGNOSIS — Z09 Encounter for follow-up examination after completed treatment for conditions other than malignant neoplasm: Secondary | ICD-10-CM

## 2017-07-24 DIAGNOSIS — I71019 Dissection of thoracic aorta, unspecified: Secondary | ICD-10-CM

## 2017-07-24 DIAGNOSIS — I351 Nonrheumatic aortic (valve) insufficiency: Secondary | ICD-10-CM

## 2017-07-24 MED ORDER — IOPAMIDOL (ISOVUE-370) INJECTION 76%
75.0000 mL | Freq: Once | INTRAVENOUS | Status: AC | PRN
  Administered 2017-07-24: 75 mL via INTRAVENOUS

## 2017-07-24 NOTE — Progress Notes (Signed)
PCP is Lawerance Cruel, MD Referring Provider is Lacretia Leigh, MD  Chief Complaint  Patient presents with  . 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  He is in a fitness program and has lost almost 85 pounds in weight.  He is careful to avoid lifting heavy weights.  He does jog and walk every day.  He denies chest pain shortness of breath edema dizziness palpitations. If he is compliant with his antihypertensive medications and heart healthy diet. His cardiologist is Dr. Sallyanne Kuster   Past Medical History:  Diagnosis Date  . High cholesterol   . Hypertension     Past Surgical History:  Procedure Laterality Date  . COLONOSCOPY  2012  . REPLACEMENT ASCENDING AORTA N/A 03/31/2016   Procedure: REPLACEMENT OF ASCENDING AORTA USING A 43mm HEMASHIELD PLATINUM VASCULAR GRAFT;  Surgeon: Ivin Poot, MD;  Location: [place];  Service: Open Heart Surgery;  Laterality: N/A;    Family History  Problem Relation Age of Onset  . Hypertension Unknown        SIblings  . CAD Mother        CABG  . Hyperlipidemia Mother   . Hypertension Father   . Prostate cancer Father   . Hyperlipidemia Father     Social History Social History  Substance Use Topics  . Smoking status: Former Research scientist (life sciences)  . Smokeless tobacco: Former Systems developer    Types: Chew  . Alcohol use No    Current Outpatient Prescriptions  Medication Sig Dispense Refill  .  amLODipine (NORVASC) 10 MG tablet TAKE 1 TABLET BY MOUTH  DAILY 90 tablet 1  . aspirin 325 MG EC tablet Take 325 mg by mouth daily.    Marland Kitchen co-enzyme Q-10 50 MG capsule Take 50 mg by mouth daily.    Marland Kitchen FERROUS SULFATE PO Take 45 mg by mouth 3 (three) times a week.     Marland Kitchen glucosamine-chondroitin 500-400 MG tablet Take 2 tablets by mouth every morning.    Marland Kitchen losartan (COZAAR) 100 MG tablet TAKE 1 TABLET BY MOUTH  DAILY 90 tablet 1  . metoprolol tartrate (LOPRESSOR) 50 MG tablet TAKE 1 TABLET BY MOUTH TWO  TIMES DAILY 180 tablet 1  . Multiple Vitamins-Minerals (MULTIVITAMIN WITH MINERALS) tablet Take 1 tablet by mouth daily.    . simvastatin (ZOCOR) 20 MG tablet Take 1 tablet by mouth daily.     No current facility-administered medications for this visit.     No Known Allergies  Review of Systems   Intentional weight loss No fever No hospitalizations No dizziness or neurologic symptoms No abdominal pain No extremity edema No chest pain or shortness of breath  BP (!) 154/58   Pulse (!) 54   Ht 6\' 1"  (1.854 m)   Wt 208 lb (94.3 kg)   SpO2 97%  BMI 27.44 kg/m  Physical Exam      Exam    General- alert and comfortable   Lungs- clear without rales, wheezes   Cor- regular rate and rhythm, 2/6 AI murmur , no  gallop   Abdomen- soft, non-tender   Extremities - warm, non-tender, minimal edema   Neuro- oriented, appropriate, no focal weakness   Diagnostic Tests: CT scan performed shows stable distal arch dissection-aneurysm 6 cm Echo shows stable moderate aortic insufficiency with good LV function and non-dilated LV  Impression: Stable after emergency repair of type a dissection with residual moderate AI, asymptomatic and compensated.  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  He understands that there is residual aortic pathology and aortic valve pathology that may need surgery in the future.  These  problems have been stable over the past year and he is compliant with his medications and heart healthy lifestyle.  I believe the risk of complications from his current aortic and aortic valve pathology are lesser than the risk of complex redo arch replacement and valve replacement.  We will continue to observe him with serial scans and he understands importance of blood pressure control   Len Childs, MD [facility] [phone]

## 2017-10-07 DIAGNOSIS — M720 Palmar fascial fibromatosis [Dupuytren]: Secondary | ICD-10-CM | POA: Diagnosis not present

## 2017-10-07 DIAGNOSIS — M79641 Pain in right hand: Secondary | ICD-10-CM | POA: Diagnosis not present

## 2017-11-09 ENCOUNTER — Other Ambulatory Visit: Payer: Self-pay | Admitting: Cardiovascular Disease

## 2017-11-09 DIAGNOSIS — I7101 Dissection of thoracic aorta: Secondary | ICD-10-CM

## 2017-11-09 DIAGNOSIS — D62 Acute posthemorrhagic anemia: Secondary | ICD-10-CM

## 2017-11-09 DIAGNOSIS — I10 Essential (primary) hypertension: Secondary | ICD-10-CM

## 2017-11-09 DIAGNOSIS — Z9889 Other specified postprocedural states: Secondary | ICD-10-CM

## 2017-11-09 DIAGNOSIS — I71019 Dissection of thoracic aorta, unspecified: Secondary | ICD-10-CM

## 2017-11-22 ENCOUNTER — Encounter: Payer: Self-pay | Admitting: Cardiovascular Disease

## 2017-11-25 ENCOUNTER — Other Ambulatory Visit: Payer: Self-pay | Admitting: *Deleted

## 2017-11-25 DIAGNOSIS — I7101 Dissection of thoracic aorta: Secondary | ICD-10-CM

## 2017-11-25 DIAGNOSIS — D62 Acute posthemorrhagic anemia: Secondary | ICD-10-CM

## 2017-11-25 DIAGNOSIS — Z9889 Other specified postprocedural states: Secondary | ICD-10-CM

## 2017-11-25 DIAGNOSIS — I71019 Dissection of thoracic aorta, unspecified: Secondary | ICD-10-CM

## 2017-11-25 DIAGNOSIS — I10 Essential (primary) hypertension: Secondary | ICD-10-CM

## 2017-11-25 MED ORDER — AMLODIPINE BESYLATE 10 MG PO TABS
10.0000 mg | ORAL_TABLET | Freq: Every day | ORAL | 3 refills | Status: DC
Start: 2017-11-25 — End: 2018-07-25

## 2017-11-25 MED ORDER — LOSARTAN POTASSIUM 100 MG PO TABS: 100.0000 mg | ORAL_TABLET | Freq: Every day | ORAL | 3 refills | Status: DC

## 2017-11-25 MED ORDER — SIMVASTATIN 20 MG PO TABS: 20.0000 mg | ORAL_TABLET | Freq: Every day | ORAL | 3 refills | Status: DC

## 2017-11-25 MED ORDER — METOPROLOL TARTRATE 50 MG PO TABS: 50.0000 mg | ORAL_TABLET | Freq: Two times a day (BID) | ORAL | 3 refills | Status: DC

## 2018-04-15 IMAGING — DX DG CHEST 1V
1 series · 1 of 1 positions shown · non-contrast
Comparison: 04/13/2016.

CLINICAL DATA: Left-sided thoracentesis for pleural effusion.

EXAM:
CHEST 1 VIEW

[x chest ap]
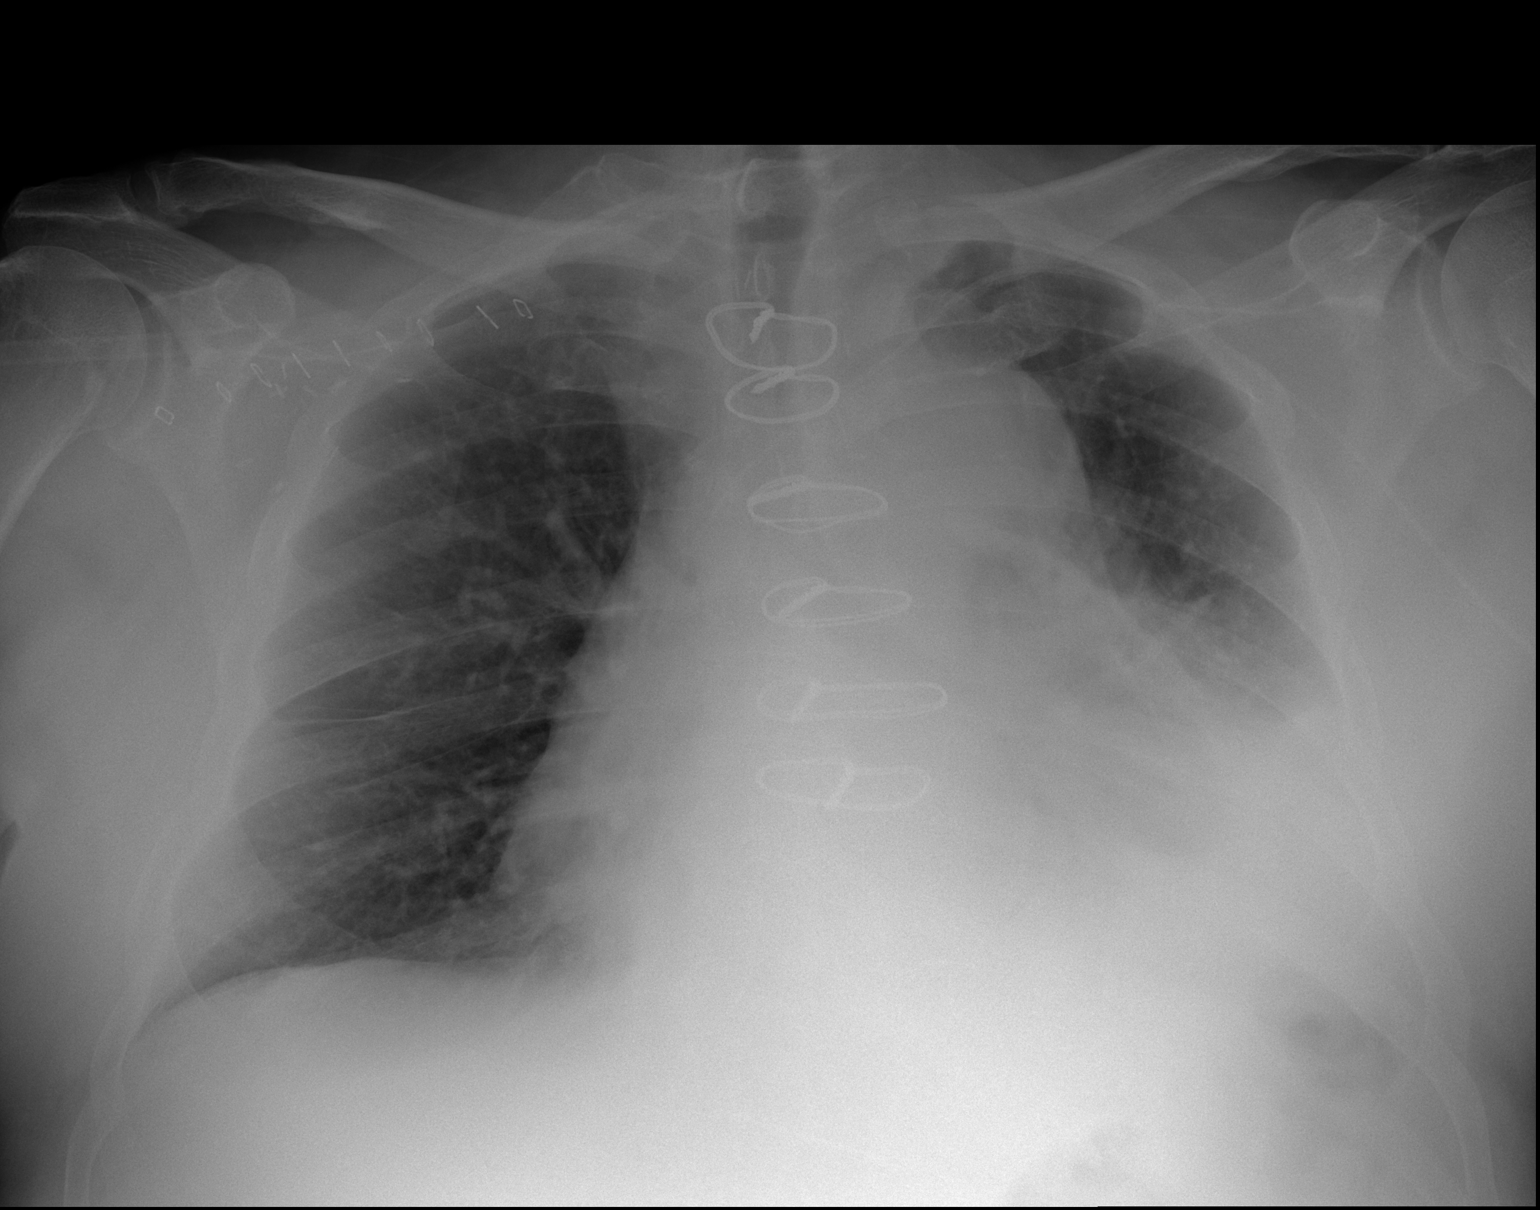

[1 of 1 positions shown; findings below may reference images not displayed]

FINDINGS: Trachea is midline. Cardiomediastinal silhouette is enlarged,
stable. Left PICC tip projects over the SVC. Moderate residual left
pleural effusion after left thoracentesis. No pneumothorax. Left
basilar airspace opacification. Right lung is clear. Surgical skin
staples project over the upper right chest.
IMPRESSION: 1. Moderate residual left pleural effusion. No pneumothorax after
thoracentesis.
2. Left basilar airspace opacification.

## 2018-04-16 IMAGING — CR DG CHEST 2V
2 series · 2 of 2 positions shown · non-contrast
Comparison: April 14, 2016

CLINICAL DATA: Follow-up pleural effusion.  Shortness of breath.

EXAM:
CHEST  2 VIEW

[chest pa]
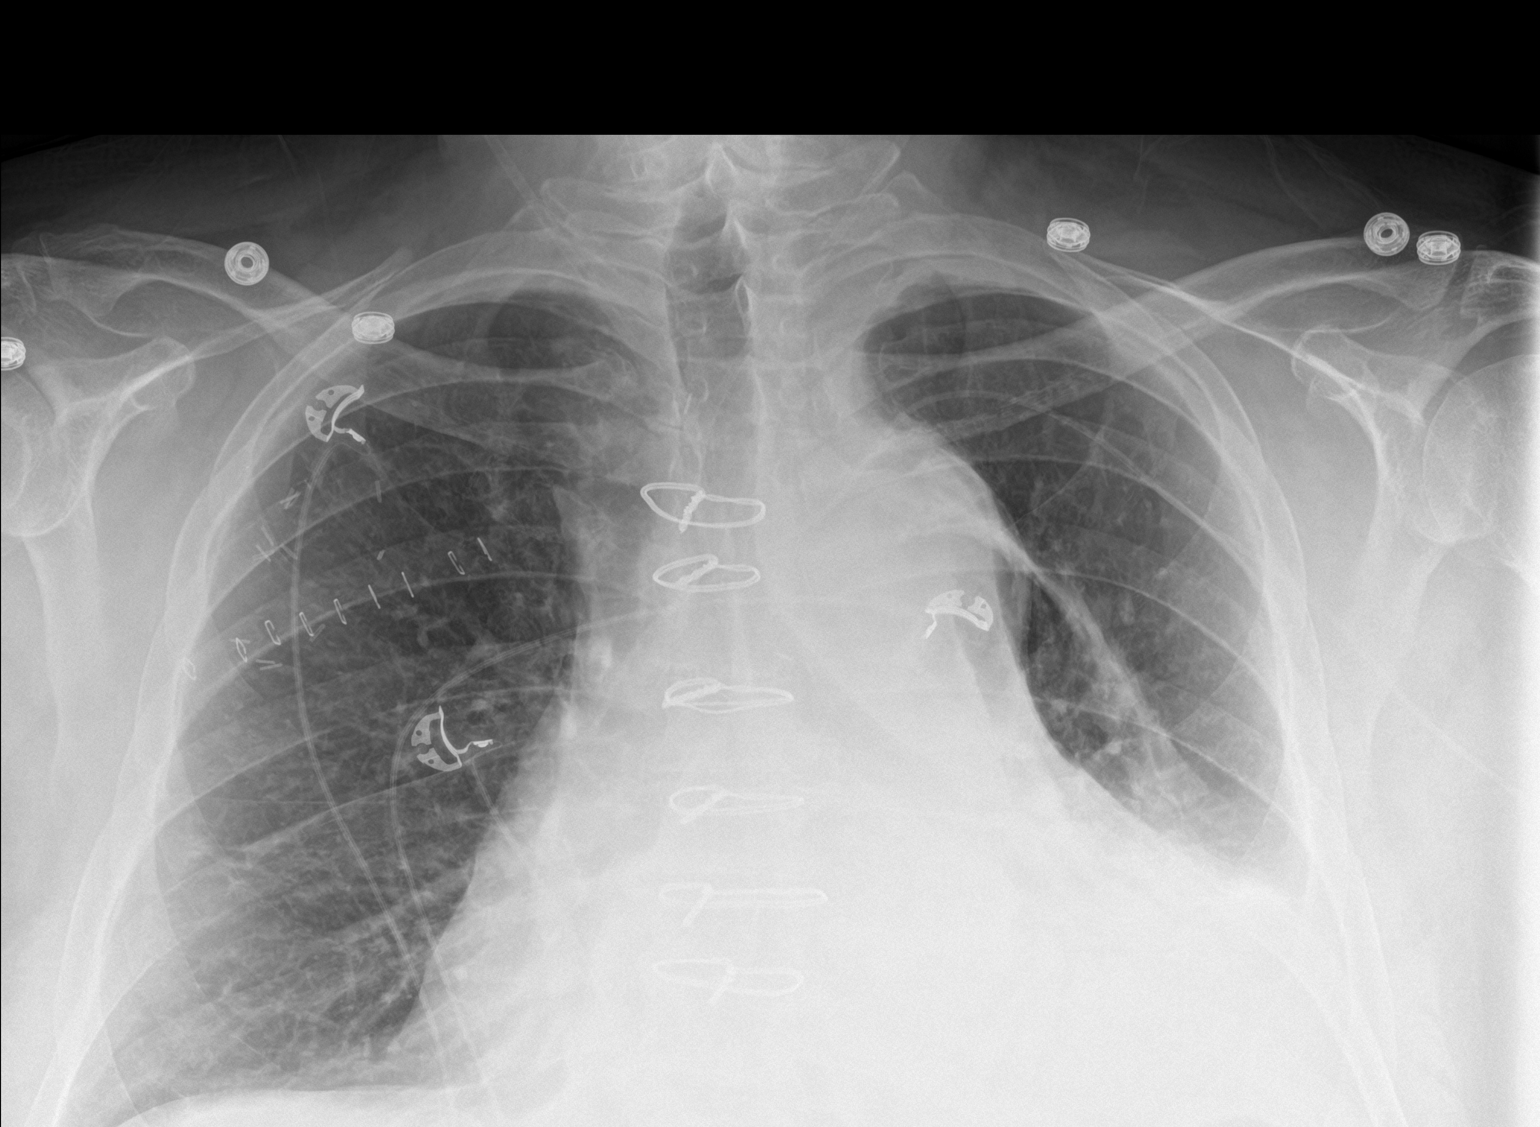

[chest lat]
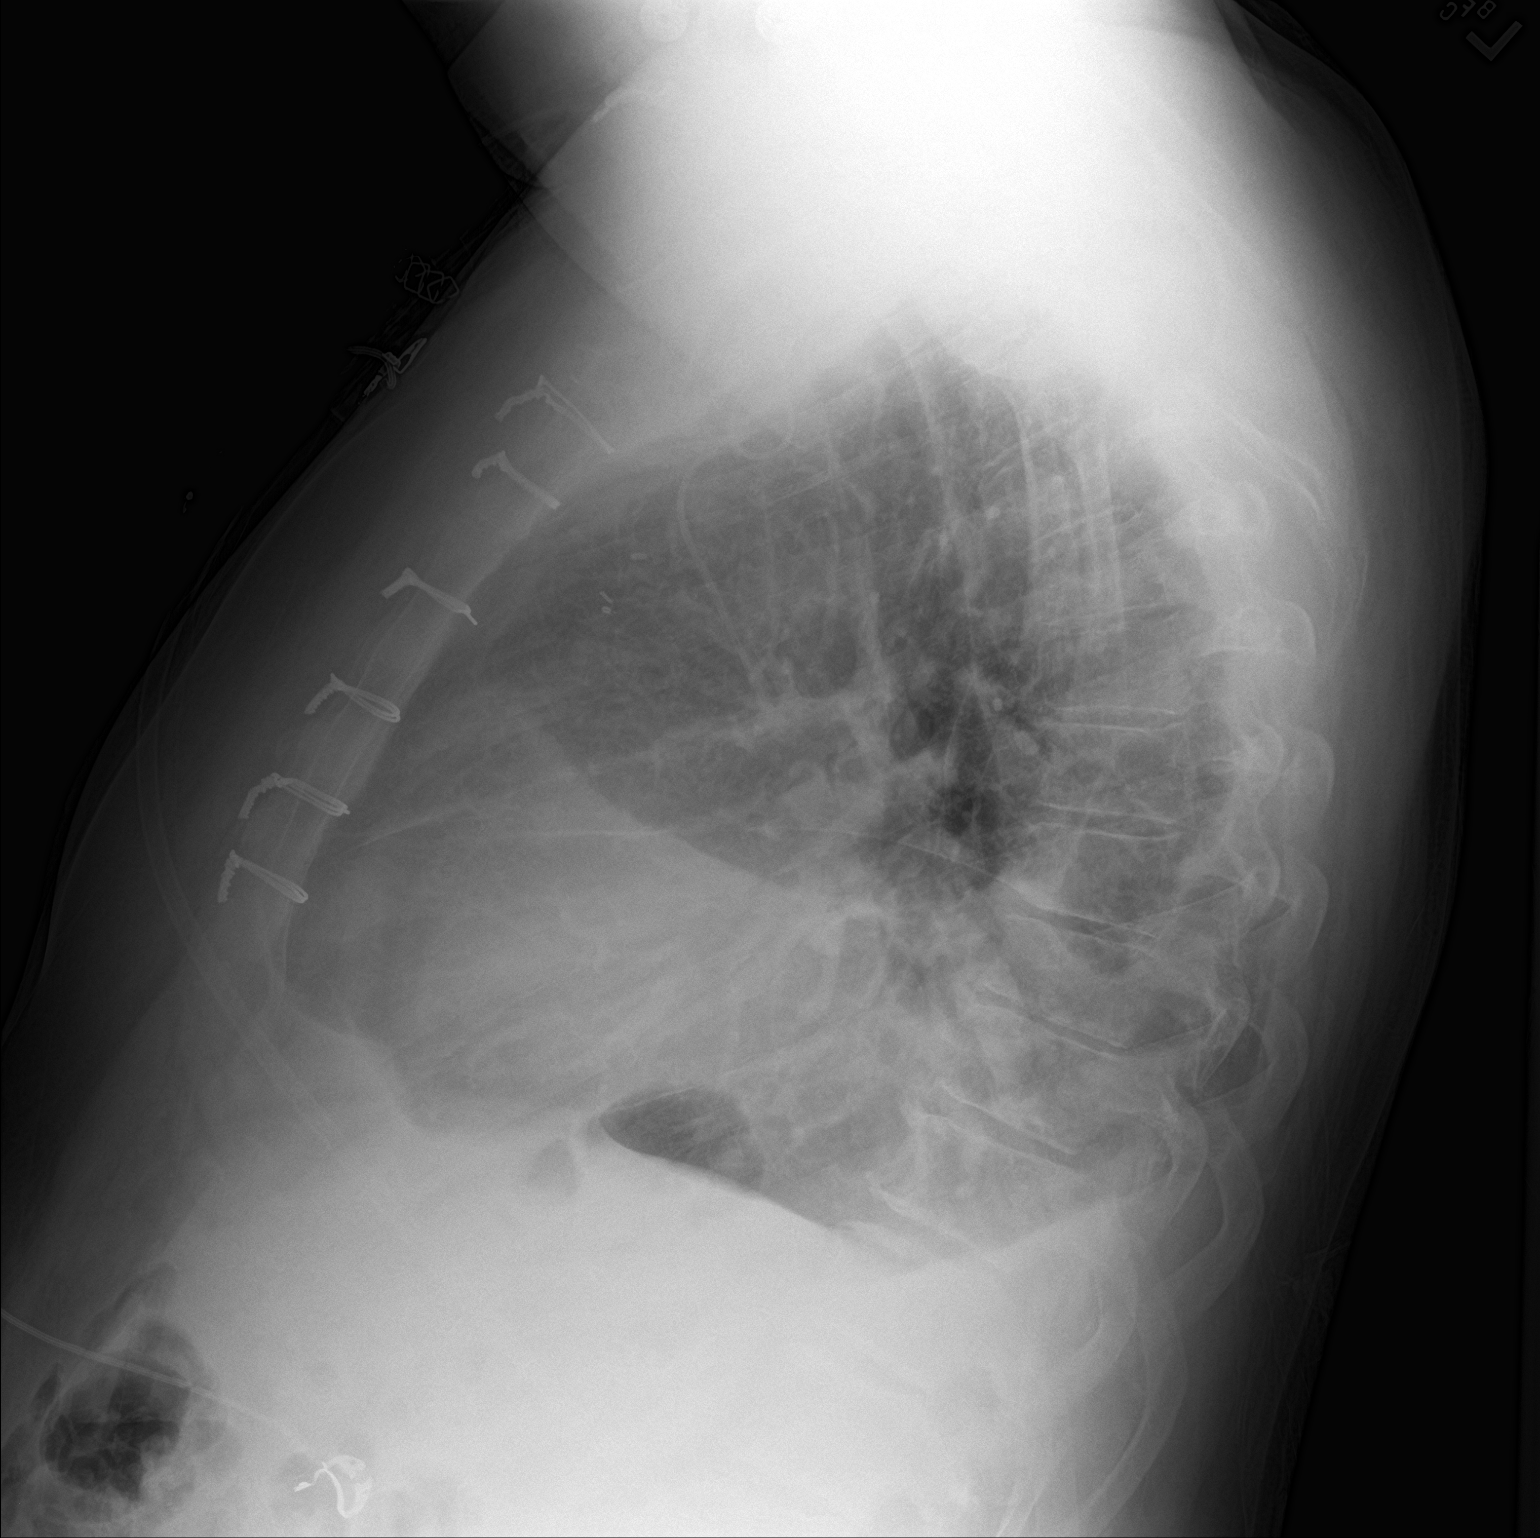

[2 of 2 positions shown; findings below may reference images not displayed]

FINDINGS: A left PICC line terminates in the central SVC. No pneumothorax. A
moderate size left-sided pleural effusion with underlying opacity is
similar in the interval. New bandlike opacity in the left perihilar
region is consistent with atelectasis. Stable cardiomegaly. No other
changes.
IMPRESSION: Stable moderate size left pleural effusion and underlying
atelectasis. Atelectasis in the left mid lung.

## 2018-05-17 IMAGING — CR DG CHEST 2V
2 series · 2 of 2 positions shown · non-contrast
Comparison: PA and lateral chest x-ray April 15, 2016

CLINICAL DATA: History of thoracic aortic replacement for
dissection on March 31, 2016. Patient reports right lower anterior
inspiratory chest pain, nonproductive cough

EXAM:
CHEST  2 VIEW

[w chest pa]
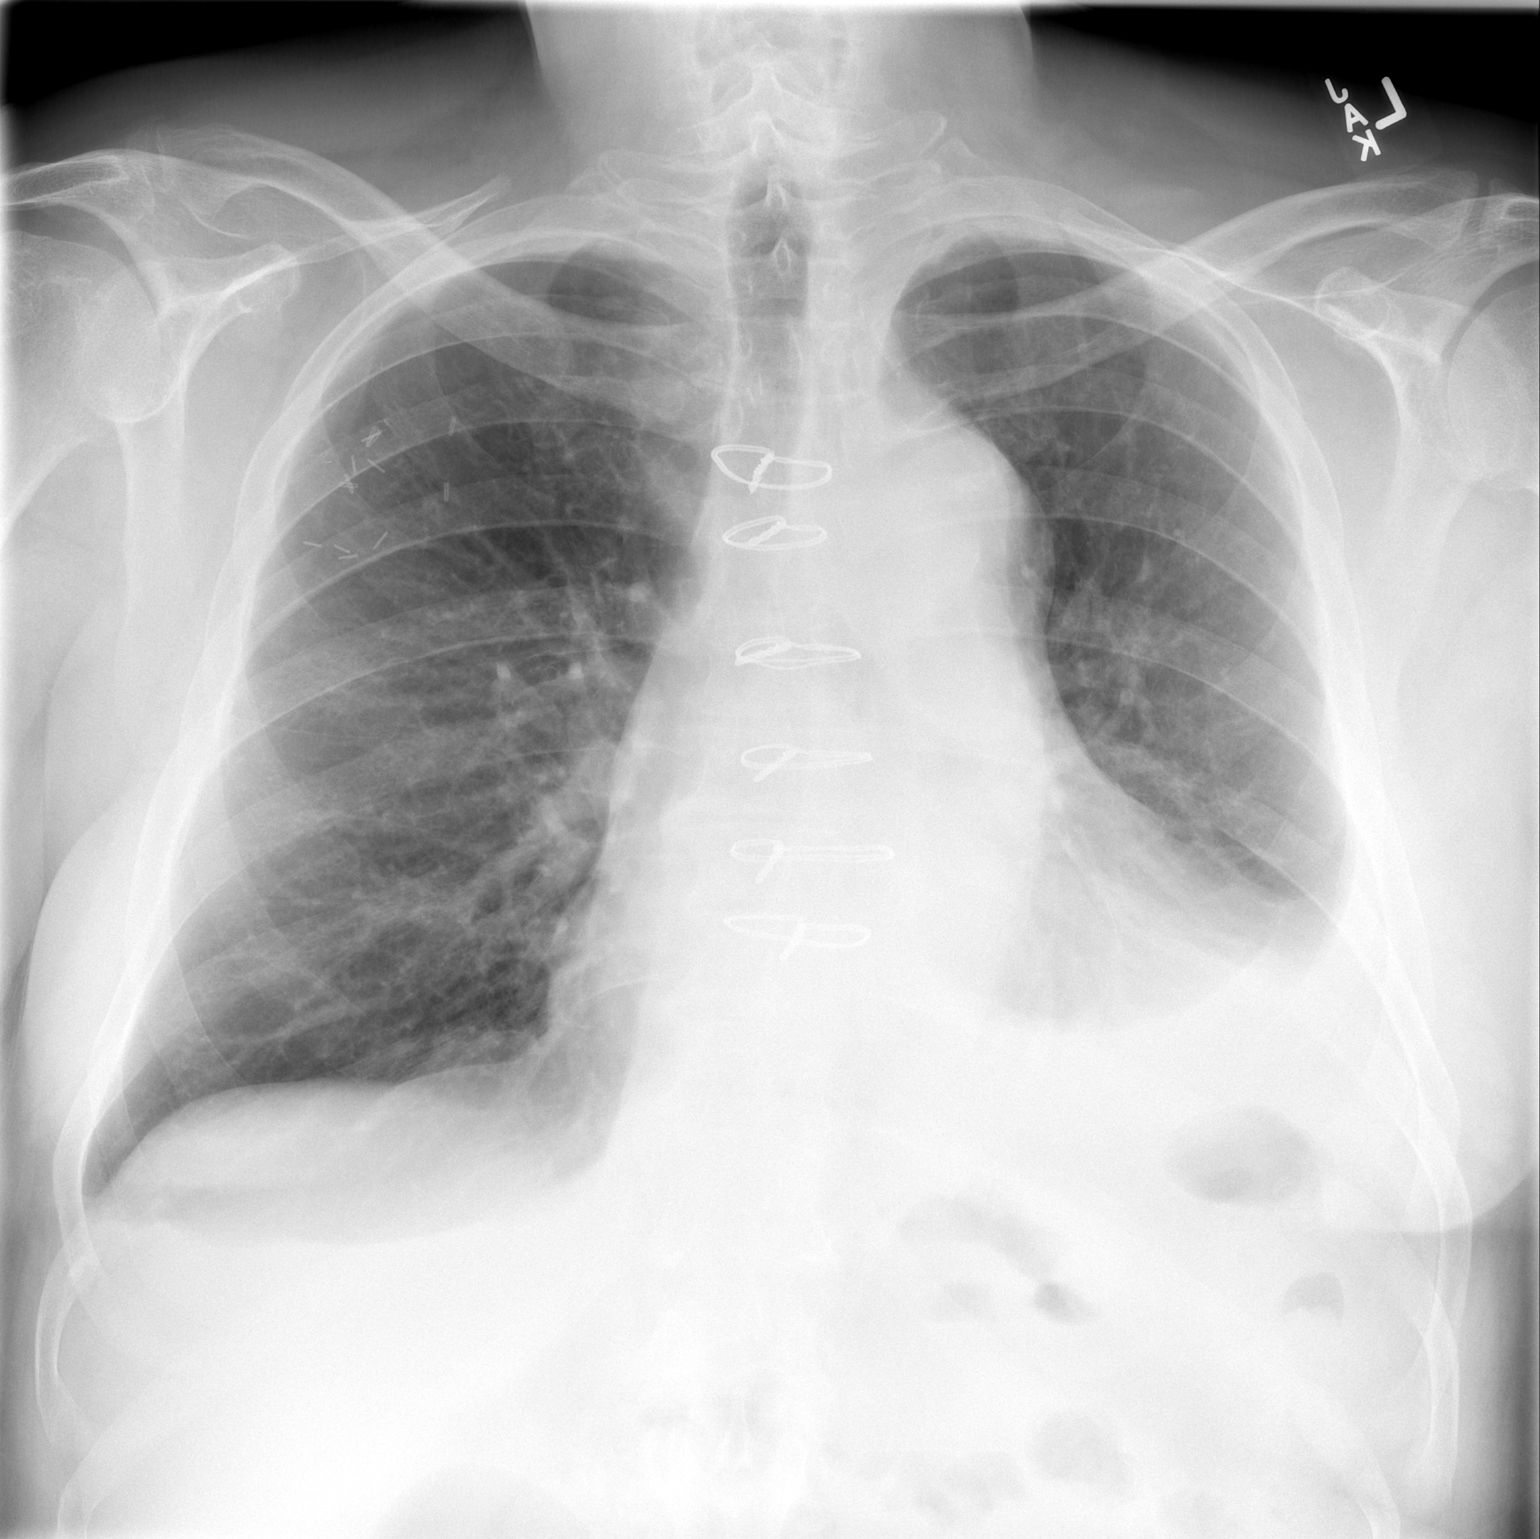

[w chest lat]
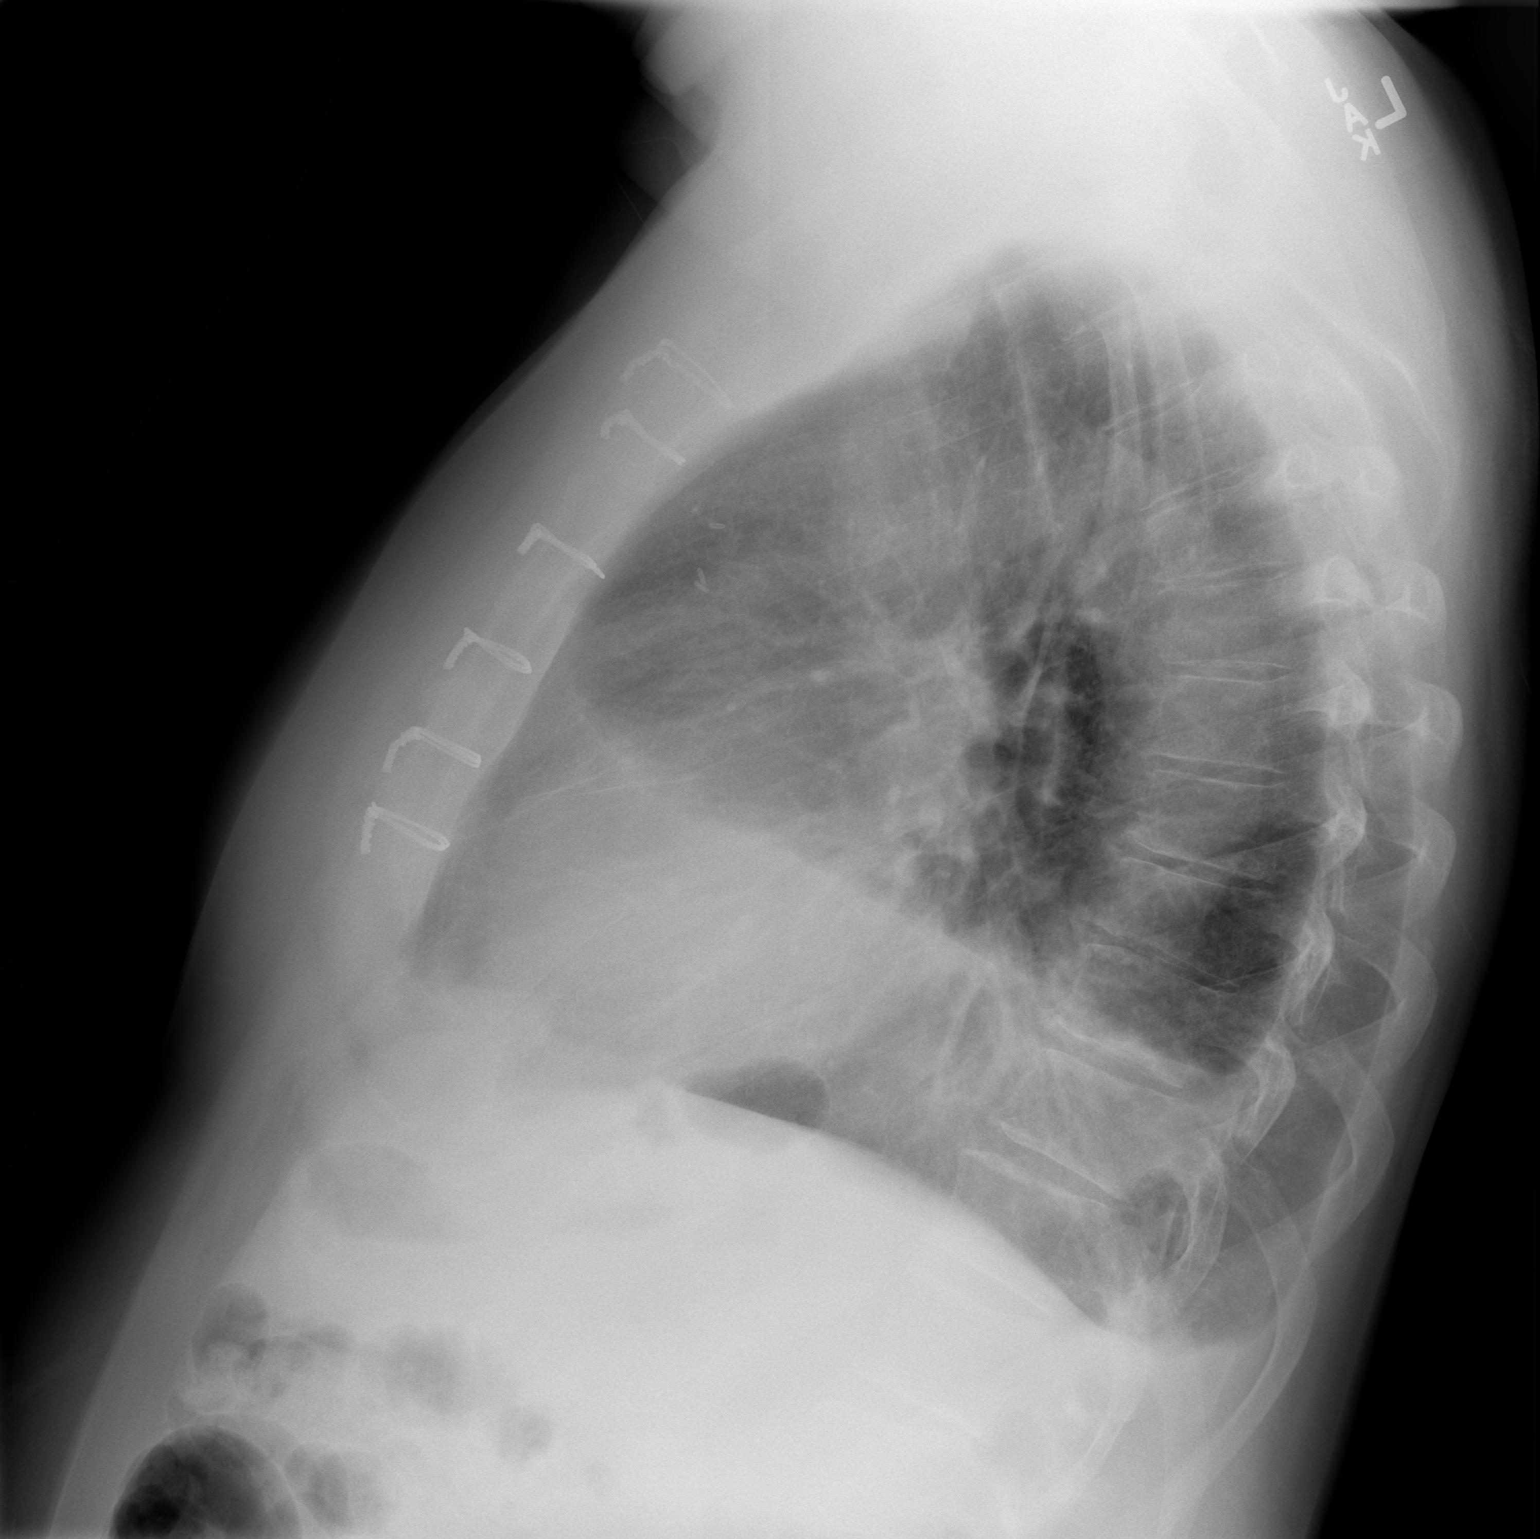

[2 of 2 positions shown; findings below may reference images not displayed]

FINDINGS: The right lung is well-expanded. There is a small right pleural
effusion. A larger left pleural effusion is present and has also
decreased in size since the previous study. There is no pneumothorax
or pneumomediastinum. The cardiac silhouette is enlarged. The
pulmonary vascularity is normal. The mediastinum demonstrates
decreased probable prominence. The sternal wires are intact. The
retrosternal soft tissues appear normal. There are surgical vascular
clips projecting over the left upper lung which are stable. The bony
thorax exhibits no acute abnormality.
IMPRESSION: Interval decrease in the volume of the left pleural effusion. Stable
small right pleural effusion. Decreased conspicuity of the
mediastinum. Stable cardiomegaly without pulmonary edema. No
pneumonia.

## 2018-05-21 DIAGNOSIS — Z13220 Encounter for screening for lipoid disorders: Secondary | ICD-10-CM | POA: Diagnosis not present

## 2018-05-21 DIAGNOSIS — Z125 Encounter for screening for malignant neoplasm of prostate: Secondary | ICD-10-CM | POA: Diagnosis not present

## 2018-05-21 DIAGNOSIS — Z0000 Encounter for general adult medical examination without abnormal findings: Secondary | ICD-10-CM | POA: Diagnosis not present

## 2018-06-12 ENCOUNTER — Other Ambulatory Visit: Payer: Self-pay | Admitting: *Deleted

## 2018-06-12 DIAGNOSIS — I71019 Dissection of thoracic aorta, unspecified: Secondary | ICD-10-CM

## 2018-06-12 DIAGNOSIS — I7101 Dissection of thoracic aorta: Secondary | ICD-10-CM

## 2018-06-30 DIAGNOSIS — H35033 Hypertensive retinopathy, bilateral: Secondary | ICD-10-CM | POA: Diagnosis not present

## 2018-07-16 ENCOUNTER — Ambulatory Visit: Payer: BLUE CROSS/BLUE SHIELD | Admitting: Cardiothoracic Surgery

## 2018-07-16 ENCOUNTER — Ambulatory Visit
Admission: RE | Admit: 2018-07-16 | Discharge: 2018-07-16 | Disposition: A | Discharge status: Home / Self Care | Payer: BLUE CROSS/BLUE SHIELD | Source: Ambulatory Visit | Attending: Cardiothoracic Surgery | Admitting: Cardiothoracic Surgery

## 2018-07-16 ENCOUNTER — Encounter: Payer: Self-pay | Admitting: Cardiothoracic Surgery

## 2018-07-16 VITALS — BP 157/60 | HR 64 | Resp 20 | Ht 73.0 in | Wt 244.0 lb

## 2018-07-16 DIAGNOSIS — I7101 Dissection of thoracic aorta: Secondary | ICD-10-CM

## 2018-07-16 DIAGNOSIS — I7100 Dissection of unspecified site of aorta: Secondary | ICD-10-CM | POA: Diagnosis not present

## 2018-07-16 DIAGNOSIS — I71019 Dissection of thoracic aorta, unspecified: Secondary | ICD-10-CM

## 2018-07-16 MED ORDER — IOPAMIDOL (ISOVUE-370) INJECTION 76%
75.0000 mL | Freq: Once | INTRAVENOUS | Status: AC | PRN
  Administered 2018-07-16: 75 mL via INTRAVENOUS

## 2018-07-16 MED ORDER — METOPROLOL TARTRATE 25 MG PO TABS: 25.0000 mg | ORAL_TABLET | Freq: Two times a day (BID) | ORAL | 1 refills | Status: DC

## 2018-07-16 NOTE — Progress Notes (Signed)
PCP is Lawerance Cruel, MD Referring Provider is Lacretia Leigh, MD  Chief Complaint  Patient presents with  . Thoracic Aortic Dissection    1 year f/u with CTA Chest, HX Emergency repair of type A ascending aortic dissection, replacement of the ascending aorta 03/31/2016    HPI: Patient returns for one-year follow-up with CTA of thoracic aorta.  He underwent repair of a Stanford type a dissection 2 years ago with a 30 mm straight graft from the sinotubular junction to the proximal arch and resuspension of the aortic valve.  He has hypertension and takes metoprolol 50 twice daily, Norvasc 10 mg daily and losartan 100 mg daily.  He denies chest pain shortness of breath or edema.  Unfortunately he has started lifting weights including bench presses up to 100 pounds.  His blood pressure is elevated today at 158/70.  The patient has moderate AI from the aortic dissection [tricuspid valve] on postoperative echo.  CTA images of today's scan personally reviewed and counseled with patient.  His aortic arch aneurysm has increased in size now up to almost 7 cm.  There is still a small false lumen in the proximal descending thoracic aorta.  There is evidence of a small faint pseudo-aneurysm at the distal anastomosis on the lesser curvature of the arch.  All these findings were demonstrated and discussed with patient.   Past Medical History:  Diagnosis Date  . High cholesterol   . Hypertension     Past Surgical History:  Procedure Laterality Date  . COLONOSCOPY  2012  . REPLACEMENT ASCENDING AORTA N/A 03/31/2016   Procedure: REPLACEMENT OF ASCENDING AORTA USING A 74mm HEMASHIELD PLATINUM VASCULAR GRAFT;  Surgeon: Ivin Poot, MD;  Location: [place];  Service: Open Heart Surgery;  Laterality: N/A;    Family History  Problem Relation Age of Onset  . Hypertension Unknown        SIblings  . CAD Mother        CABG  . Hyperlipidemia Mother   . Hypertension Father   . Prostate cancer Father   .  Hyperlipidemia Father     Social History Social History   Tobacco Use  . Smoking status: Former Research scientist (life sciences)  . Smokeless tobacco: Former Systems developer    Types: Chew  Substance Use Topics  . Alcohol use: No    Alcohol/week: 0.0 standard drinks  . Drug use: Not on file    Current Outpatient Medications  Medication Sig Dispense Refill  . amLODipine (NORVASC) 10 MG tablet Take 1 tablet (10 mg total) by mouth daily. 90 tablet 3  . aspirin 325 MG EC tablet Take 325 mg by mouth daily.    Marland Kitchen FERROUS SULFATE PO Take 45 mg by mouth 3 (three) times a week.     Marland Kitchen glucosamine-chondroitin 500-400 MG tablet Take 2 tablets by mouth every morning.    Marland Kitchen losartan (COZAAR) 100 MG tablet Take 1 tablet (100 mg total) by mouth daily. 90 tablet 3  . metoprolol tartrate (LOPRESSOR) 50 MG tablet Take 1 tablet (50 mg total) by mouth 2 (two) times daily. 180 tablet 3  . Multiple Vitamins-Minerals (MULTIVITAMIN WITH MINERALS) tablet Take 1 tablet by mouth daily.    . simvastatin (ZOCOR) 20 MG tablet Take 1 tablet (20 mg total) by mouth daily. 90 tablet 3  . metoprolol tartrate (LOPRESSOR) 25 MG tablet Take 1 tablet (25 mg total) by mouth 2 (two) times daily. 60 tablet 1   No current facility-administered medications for this visit.  No Known Allergies  Review of Systems  Patient unable to lose weight despite diet No chest pain No shortness of breath No edema No headache No change in vision or TIAs  BP (!) 157/60   Pulse 64   Resp 20   Ht 6\' 1"  (1.854 m)   Wt 244 lb (110.7 kg)   SpO2 97% Comment: RA  BMI 32.19 kg/m  Physical Exam      Exam    General- alert and comfortable    Neck- no JVD, no cervical adenopathy palpable, no carotid bruit, strong bilateral carotid pulses   Lungs- clear without rales, wheezes   Cor- regular rate and rhythm, 3/6 AI  murmur ,  No gallop   Abdomen- soft, non-tender   Extremities - warm, non-tender, minimal edema   Neuro- oriented, appropriate, no focal  weakness   Diagnostic Tests: CT scan images as noted above  Impression: 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 Ysidro Evert.  We will send the scans and records for referral.  I recommended the patient increase his metoprolol to 75 mg twice daily and have called in a prescription of 25 mg tablets to be taken with his 50 mg tablets twice a day. I recommended to the patient that he stop doing any weight lifting but  to continue aerobic activity. I will see him back in 3 months for follow-up. Len Childs, MD [facility] [phone]

## 2018-07-16 NOTE — Progress Notes (Signed)
Thank you, Huey Bienenstock

## 2018-07-24 ENCOUNTER — Other Ambulatory Visit (HOSPITAL_COMMUNITY): Payer: 59

## 2018-07-25 ENCOUNTER — Ambulatory Visit (INDEPENDENT_AMBULATORY_CARE_PROVIDER_SITE_OTHER): Payer: BLUE CROSS/BLUE SHIELD | Admitting: Cardiovascular Disease

## 2018-07-25 ENCOUNTER — Encounter: Payer: Self-pay | Admitting: Cardiovascular Disease

## 2018-07-25 VITALS — BP 148/70 | HR 59 | Ht 73.0 in | Wt 249.0 lb

## 2018-07-25 DIAGNOSIS — D62 Acute posthemorrhagic anemia: Secondary | ICD-10-CM

## 2018-07-25 DIAGNOSIS — Z79899 Other long term (current) drug therapy: Secondary | ICD-10-CM

## 2018-07-25 DIAGNOSIS — I351 Nonrheumatic aortic (valve) insufficiency: Secondary | ICD-10-CM

## 2018-07-25 DIAGNOSIS — E669 Obesity, unspecified: Secondary | ICD-10-CM

## 2018-07-25 DIAGNOSIS — E7800 Pure hypercholesterolemia, unspecified: Secondary | ICD-10-CM

## 2018-07-25 DIAGNOSIS — I7101 Dissection of thoracic aorta: Secondary | ICD-10-CM

## 2018-07-25 DIAGNOSIS — I10 Essential (primary) hypertension: Secondary | ICD-10-CM

## 2018-07-25 DIAGNOSIS — I71019 Dissection of thoracic aorta, unspecified: Secondary | ICD-10-CM

## 2018-07-25 MED ORDER — HYDROCHLOROTHIAZIDE 25 MG PO TABS: 25.0000 mg | ORAL_TABLET | Freq: Every day | ORAL | 3 refills | Status: DC

## 2018-07-25 MED ORDER — SIMVASTATIN 20 MG PO TABS: 20.0000 mg | ORAL_TABLET | Freq: Every day | ORAL | 3 refills | Status: DC

## 2018-07-25 MED ORDER — LOSARTAN POTASSIUM 100 MG PO TABS: 100.0000 mg | ORAL_TABLET | Freq: Every day | ORAL | 3 refills | Status: DC

## 2018-07-25 MED ORDER — CARVEDILOL 25 MG PO TABS: 25.0000 mg | ORAL_TABLET | Freq: Two times a day (BID) | ORAL | 3 refills | Status: DC

## 2018-07-25 MED ORDER — ASPIRIN 81 MG PO TBEC: 81.0000 mg | DELAYED_RELEASE_TABLET | Freq: Every day | ORAL | Status: AC

## 2018-07-25 MED ORDER — AMLODIPINE BESYLATE 10 MG PO TABS: 10.0000 mg | ORAL_TABLET | Freq: Every day | ORAL | 3 refills | Status: DC

## 2018-07-25 NOTE — Progress Notes (Signed)
Cardiology Office Note    Date:  07/27/2018   ID:  Jon Rodriguez, DOB October 14, 1960, MRN [id number]  PCP:  Lawerance Cruel, MD  Cardiologist:   Sanda Klein, MD   Chief Complaint  Patient presents with  . Follow-up    1 months  . Chest Pain    Left breast when working out.    History of Present Illness:  Jon Rodriguez is a 57 y.o. 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 Prescott Gum, July 2017). He required transient postoperative intra-aortic balloon pump support for postoperative RV dysfunction.    He has residual dilation of the aortic root (5.2 cm) and he has moderate aortic insufficiency.  He also has continued enlargement of the aorta distal to the repaired segment, now up to 6.9 cm.  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  Recently saw Dr. Prescott Gum with a plan for referral to [facility] for complex repair of the aorta and aortic valve.  From a clinical point of view he is doing quite well.  He continues to exercise regularly and occasionally complains of some left-sided chest discomfort which sounds musculoskeletal.  He does not have angina pectoris.  He has an occasional twinge of discomfort in his right posterior chest, but also believes that he is hypervigilant ever since the episode of dissection.  The discomfort can occur both during activity and while simply sitting down without activity.  He believes it is gradually improving as time goes by.  Unfortunately he has gained back about 40 pounds of the 70 pounds of weight that he had lost since his dissection.  He is mildly obese with a BMI of 33.  His blood pressure control which  has been adequate is now insufficient.  This may be in part due to his weight gain.  Today I checked the blood pressure in both his left and right upper extremities at 158/46 mmHg.  His last echocardiogram performed about a year ago showed left ventricular ejection fraction of 50-55%.  The left ventricle is mildly dilated at 57 mm but with a normal end-systolic diameter of 33 mm.  The aortic insufficiency pressure half-time was very short at 184 ms, consistent with severe aortic insufficiency, although the report quoted  moderate aortic insufficiency.  Past Medical History:  Diagnosis Date  . High cholesterol   . Hypertension     Past Surgical History:  Procedure Laterality Date  . COLONOSCOPY  2012  . REPLACEMENT ASCENDING AORTA N/A 03/31/2016   Procedure: REPLACEMENT OF ASCENDING AORTA USING A 24mm HEMASHIELD PLATINUM VASCULAR GRAFT;  Surgeon: Ivin Poot, MD;  Location: [facility];  Service: Open Heart Surgery;  Laterality: N/A;    Current Medications: Outpatient Medications Prior to Visit  Medication Sig Dispense Refill  . FERROUS SULFATE PO Take 45 mg by mouth 3 (three) times a week.     Marland Kitchen glucosamine-chondroitin 500-400 MG tablet Take 2 tablets by mouth every morning.    . Multiple Vitamins-Minerals (MULTIVITAMIN WITH MINERALS) tablet Take 1 tablet by mouth daily.    Marland Kitchen amLODipine (NORVASC) 10 MG tablet Take 1 tablet (10 mg total) by mouth daily. 90 tablet 3  .  aspirin 325 MG EC tablet Take 325 mg by mouth daily.    Marland Kitchen losartan (COZAAR) 100 MG tablet Take 1 tablet (100 mg total) by mouth daily. 90 tablet 3  . metoprolol tartrate (LOPRESSOR) 25 MG tablet Take 1 tablet (25 mg total) by mouth 2 (two) times daily. 60 tablet 1  . metoprolol tartrate (LOPRESSOR) 50 MG tablet Take 1 tablet (50 mg total) by mouth 2 (two) times daily. 180 tablet 3  . simvastatin (ZOCOR) 20 MG tablet Take 1 tablet (20 mg total) by mouth daily. 90 tablet 3   No facility-administered medications prior to visit.        Allergies:   Patient has no known allergies.   Social History   Socioeconomic History  . Marital status: Divorced    Spouse name: Not on file  . Number of children: Not on file  . Years of education: Not on file  . Highest education level: Not on file  Occupational History  . Not on file  Social Needs  . Financial resource strain: Not on file  . Food insecurity:    Worry: Not on file    Inability: Not on file  . Transportation needs:    Medical: Not on file    Non-medical: Not on file  Tobacco Use  . Smoking status: Former Research scientist (life sciences)  . Smokeless tobacco: Former Systems developer    Types: Chew  Substance and Sexual Activity  . Alcohol use: No    Alcohol/week: 0.0 standard drinks  . Drug use: Not on file  . Sexual activity: Not on file  Lifestyle  . Physical activity:    Days per week: Not on file    Minutes per session: Not on file  . Stress: Not on file  Relationships  . Social connections:    Talks on phone: Not on file    Gets together: Not on file    Attends religious service: Not on file    Active member of club or organization: Not on file    Attends meetings of clubs or organizations: Not on file    Relationship status: Not on file  Other Topics Concern  . Not on file  Social History Narrative  . Not on file     Family History:  The patient's family history includes CAD in his mother; Hyperlipidemia in his father and mother; Hypertension in his father and unknown relative; Prostate cancer in his father.   ROS:   Please see the history of present illness.    ROS All other systems reviewed and are negative.   PHYSICAL EXAM:   VS:  BP (!) 148/70 (BP Location: Left Arm, Patient Position: Sitting, Cuff Size: Large)   Pulse (!) 59   Ht 6\' 1"  (1.854 m)   Wt 249 lb (112.9 kg)   BMI 32.85 kg/m    Right arm blood pressure 158/46, left arm blood pressure 156/46   General: Alert, oriented x3, no distress, obese, healed sternotomy scar Head: no evidence of trauma,  PERRL, EOMI, no exophtalmos or lid lag, no myxedema, no xanthelasma; normal ears, nose and oropharynx Neck: normal jugular venous pulsations and no hepatojugular reflux; brisk carotid pulses without delay and no carotid bruits Chest: clear to auscultation, no signs of consolidation by percussion or palpation, normal fremitus, symmetrical and full respiratory excursions Cardiovascular: normal position and quality of the apical impulse, regular rhythm, normal first and second heart sounds, no rubs or gallops, 2/6 aortic ejection murmur early peaking, 3/6 decrescendo aortic insufficiency  murmur heard best at the left lower sternal border Abdomen: no tenderness or distention, no masses by palpation, no abnormal pulsatility or arterial bruits, normal bowel sounds, no hepatosplenomegaly Extremities: no clubbing, cyanosis or edema; 2+ radial, ulnar and brachial pulses bilaterally; 2+ right femoral, posterior tibial and dorsalis pedis pulses; 2+ left femoral, posterior tibial and dorsalis pedis pulses; no subclavian or femoral bruits Neurological: grossly nonfocal Psych: Normal mood and affect   Wt Readings from Last 3 Encounters:  07/25/18 249 lb (112.9 kg)  07/16/18 244 lb (110.7 kg)  07/24/17 208 lb (94.3 kg)      Studies/Labs Reviewed:   EKG:  EKG is ordered today.  It shows sinus bradycardia with prominent changes of left ventricular hypertrophy and ST depression and T wave inversion in the lateral leads.  The QRS is broad at 116 ms and the QTC is mildly prolonged at 477 ms Recent Labs: No results found for requested labs within last 8760 hours.  Lipid Panel     Component Value Date/Time   CHOL 192 07/23/2017 0928   TRIG 67 07/23/2017 0928   HDL 70 07/23/2017 0928   CHOLHDL 2.7 07/23/2017 0928   CHOLHDL 3.3 06/11/2016 0815   VLDL 16 06/11/2016 0815   LDLCALC 109 (H) 07/23/2017 0928     ASSESSMENT:    1. Aortic dissection, thoracic ([place])   2. Nonrheumatic aortic valve insufficiency    3. Essential hypertension   4. Hypercholesterolemia   5. Mild obesity   6. Anemia associated with acute blood loss   7. Medication management      PLAN:  In order of problems listed above:  1. S/P Asc Ao Dissection: Clinically Mr. Kamara has done well, but there is evidence of further enlargement of his aorta with development of a pseudoaneurysm.  In addition he appears to have severe aortic insufficiency.  Will require further surgical treatment and is being referred to [facility].  2. AI: Whether this is moderate or severe there is evidence of left ventricular enlargement and borderline left ventricular systolic function.  Clinically he does not have heart failure.  He has an extremely broad pulse pressure, consistent with severe AI.  He should have aortic valve replacement/repair at the time of his aortic root repair.  3. HTN: His blood pressure is unacceptably high.  He is already on maximum doses of amlodipine and losartan and is relatively bradycardic on the current dose of metoprolol.  We will switch to carvedilol and add a thiazide diuretic.  Reassess blood pressure in about 2 weeks. 4. HLP: No known coronary or peripheral vascular disease.  Target LDL less than 100, time to recheck.  He is on simvastatin. 5. Obesity: He did a great job with weight loss following his aortic dissection but unfortunately has gained back more than half of his weight loss.  Discussed the fact that his rehabilitation will be easier after his second surgery if he weighs less.  Reminded him not to engage in isotonic, intense physical exercise such as weight lifting.    Medication Adjustments/Labs and Tests Ordered: Current medicines are reviewed at length with the patient today.  Concerns regarding medicines are outlined above.  Medication changes, Labs and Tests ordered today are listed in the Patient Instructions below. Patient Instructions  Medication Instructions:  Dr Sallyanne Kuster has recommended  making the following medication changes: 1. STOP Metoprolol 2. START Carvediolol 25 mg - take 1 tablet twice daily 3. START Hydrochlorothiazide 25 mg - take 1 tablet daily 4.  DECREASE Aspirin to 81 mg daily  If you need a refill on your cardiac medications before your next appointment, please call your pharmacy.   Lab work: Your physician recommends that you return for lab work in 67 month - FASTING.  If you have labs (blood work) drawn today and your tests are completely normal, you will receive your results only by: Marland Kitchen MyChart Message (if you have MyChart) OR . A paper copy in the mail If you have any lab test that is abnormal or we need to change your treatment, we will call you to review the results.  Follow-Up: At [facility], you and your health needs are our priority.  As part of our continuing mission to provide you with exceptional heart care, we have created designated Provider Care Teams.  These Care Teams include your primary Cardiologist (physician) and Advanced Practice Providers (APPs -  Physician Assistants and Nurse Practitioners) who all work together to provide you with the care you need, when you need it. You will need a follow up appointment in 3 months. You may see Sanda Klein, MD or one of the following Advanced Practice Providers on your designated Care Team: [place], [place] . Fabian Sharp, PA-C    Signed, Sanda Klein, MD  07/27/2018 1:14 PM    [facility] [place], [place], [place]  [address] Phone: [phone]; Fax: [phone]

## 2018-07-25 NOTE — Patient Instructions (Signed)
Medication Instructions:  Dr Sallyanne Kuster has recommended making the following medication changes: 1. STOP Metoprolol 2. START Carvediolol 25 mg - take 1 tablet twice daily 3. START Hydrochlorothiazide 25 mg - take 1 tablet daily 4. DECREASE Aspirin to 81 mg daily  If you need a refill on your cardiac medications before your next appointment, please call your pharmacy.   Lab work: Your physician recommends that you return for lab work in 64 month - FASTING.  If you have labs (blood work) drawn today and your tests are completely normal, you will receive your results only by: Marland Kitchen MyChart Message (if you have MyChart) OR . A paper copy in the mail If you have any lab test that is abnormal or we need to change your treatment, we will call you to review the results.  Follow-Up: At [facility], you and your health needs are our priority.  As part of our continuing mission to provide you with exceptional heart care, we have created designated Provider Care Teams.  These Care Teams include your primary Cardiologist (physician) and Advanced Practice Providers (APPs -  Physician Assistants and Nurse Practitioners) who all work together to provide you with the care you need, when you need it. You will need a follow up appointment in 3 months. You may see Sanda Klein, MD or one of the following Advanced Practice Providers on your designated Care Team: [place], [place] . Fabian Sharp, PA-C

## 2018-07-28 ENCOUNTER — Other Ambulatory Visit: Payer: Self-pay

## 2018-07-28 ENCOUNTER — Ambulatory Visit (HOSPITAL_COMMUNITY): Payer: BLUE CROSS/BLUE SHIELD | Attending: Cardiovascular Disease

## 2018-07-28 ENCOUNTER — Telehealth: Payer: Self-pay

## 2018-07-28 DIAGNOSIS — I351 Nonrheumatic aortic (valve) insufficiency: Secondary | ICD-10-CM | POA: Diagnosis not present

## 2018-07-28 MED ORDER — HYDROCHLOROTHIAZIDE 25 MG PO TABS: 25.0000 mg | ORAL_TABLET | Freq: Every day | ORAL | 0 refills | Status: DC

## 2018-07-28 MED ORDER — CARVEDILOL 25 MG PO TABS: 25.0000 mg | ORAL_TABLET | Freq: Two times a day (BID) | ORAL | 0 refills | Status: DC

## 2018-07-28 NOTE — Telephone Encounter (Signed)
Rx(s) sent to pharmacy electronically.  

## 2018-08-13 ENCOUNTER — Encounter: Payer: Self-pay | Admitting: Cardiothoracic Surgery

## 2018-08-27 DIAGNOSIS — E7800 Pure hypercholesterolemia, unspecified: Secondary | ICD-10-CM | POA: Diagnosis not present

## 2018-08-27 DIAGNOSIS — Z79899 Other long term (current) drug therapy: Secondary | ICD-10-CM | POA: Diagnosis not present

## 2018-08-28 LAB — COMPREHENSIVE METABOLIC PANEL
ALBUMIN: 4.1 g/dL (ref 3.5–5.5)
ALK PHOS: 71 IU/L (ref 39–117)
ALT: 49 IU/L — ABNORMAL HIGH (ref 0–44)
AST: 26 IU/L (ref 0–40)
Albumin/Globulin Ratio: 1.9 (ref 1.2–2.2)
BUN / CREAT RATIO: 24 — AB (ref 9–20)
BUN: 25 mg/dL — AB (ref 6–24)
Bilirubin Total: 0.5 mg/dL (ref 0.0–1.2)
CALCIUM: 9.3 mg/dL (ref 8.7–10.2)
CO2: 24 mmol/L (ref 20–29)
CREATININE: 1.04 mg/dL (ref 0.76–1.27)
Chloride: 95 mmol/L — ABNORMAL LOW (ref 96–106)
GFR calc Af Amer: 92 mL/min/{1.73_m2} (ref >59)
GFR, EST NON AFRICAN AMERICAN: 79 mL/min/{1.73_m2} (ref >59)
GLOBULIN, TOTAL: 2.2 g/dL (ref 1.5–4.5)
Glucose: 100 mg/dL — ABNORMAL HIGH (ref 65–99)
Potassium: 4.4 mmol/L (ref 3.5–5.2)
SODIUM: 132 mmol/L — AB (ref 134–144)
Total Protein: 6.3 g/dL (ref 6.0–8.5)

## 2018-08-28 LAB — LIPID PANEL
CHOLESTEROL TOTAL: 201 mg/dL — AB (ref 100–199)
Chol/HDL Ratio: 3 ratio (ref 0.0–5.0)
HDL: 67 mg/dL (ref >39)
LDL Calculated: 121 mg/dL — ABNORMAL HIGH (ref 0–99)
Triglycerides: 66 mg/dL (ref 0–149)
VLDL Cholesterol Cal: 13 mg/dL (ref 5–40)

## 2018-10-13 ENCOUNTER — Encounter: Payer: Self-pay | Admitting: Cardiothoracic Surgery

## 2018-10-27 ENCOUNTER — Ambulatory Visit: Payer: BLUE CROSS/BLUE SHIELD | Admitting: Cardiovascular Disease

## 2018-10-27 ENCOUNTER — Encounter: Payer: Self-pay | Admitting: Cardiovascular Disease

## 2018-10-27 VITALS — BP 144/62 | HR 60 | Ht 73.0 in | Wt 252.0 lb

## 2018-10-27 DIAGNOSIS — I7103 Dissection of thoracoabdominal aorta: Secondary | ICD-10-CM

## 2018-10-27 DIAGNOSIS — I10 Essential (primary) hypertension: Secondary | ICD-10-CM

## 2018-10-27 DIAGNOSIS — I351 Nonrheumatic aortic (valve) insufficiency: Secondary | ICD-10-CM

## 2018-10-27 DIAGNOSIS — Z6833 Body mass index (BMI) 33.0-33.9, adult: Secondary | ICD-10-CM

## 2018-10-27 DIAGNOSIS — E6609 Other obesity due to excess calories: Secondary | ICD-10-CM | POA: Diagnosis not present

## 2018-10-27 DIAGNOSIS — E7800 Pure hypercholesterolemia, unspecified: Secondary | ICD-10-CM

## 2018-10-27 MED ORDER — CARVEDILOL 25 MG PO TABS: 37.5000 mg | ORAL_TABLET | Freq: Two times a day (BID) | ORAL | 2 refills | Status: DC

## 2018-10-27 NOTE — Patient Instructions (Addendum)
Medication Instructions:  INCREASE Coreg to 37.5mg  take 1 tablet twice a day If you need a refill on your cardiac medications before your next appointment, please call your pharmacy.   Lab work: None  If you have labs (blood work) drawn today and your tests are completely normal, you will receive your results only by: Marland Kitchen MyChart Message (if you have MyChart) OR . A paper copy in the mail If you have any lab test that is abnormal or we need to change your treatment, we will call you to review the results.  Testing/Procedures: None   Follow-Up: At [facility], you and your health needs are our priority.  As part of our continuing mission to provide you with exceptional heart care, we have created designated Provider Care Teams.  These Care Teams include your primary Cardiologist (physician) and Advanced Practice Providers (APPs -  Physician Assistants and Nurse Practitioners) who all work together to provide you with the care you need, when you need it. You will need a follow up appointment in 6 months.  Please call our office 2 months in advance to schedule this appointment.  You may see Sanda Klein, MD or one of the following Advanced Practice Providers on your designated Care Team: [place], [place] . Fabian Sharp, PA-C  Any Other Special Instructions Will Be Listed Below (If Applicable). Send your blood pressure readings via mychart in 10 days.

## 2018-10-27 NOTE — Progress Notes (Signed)
Cardiology Office Note    Date:  10/28/2018   ID:  Jon Rodriguez, DOB 1960/11/13, MRN [id number]  PCP:  Lawerance Cruel, MD  Cardiologist:   Sanda Klein, MD   Chief Complaint  Patient presents with  . Cardiac Valve Problem    Aortic insufficiency    History of Present Illness:  Jon Rodriguez is a 58 y.o. 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 Prescott Gum, July 2017). He required transient postoperative intra-aortic balloon pump support for postoperative RV dysfunction.    He has residual dilation of the aortic root (5.2 cm) and he has moderate aortic insufficiency.  He also has continued enlargement of the aorta distal to the repaired segment, now up to 6.9 cm.  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  Recently saw Dr. Prescott Gum with a plan for referral to Dr. Mali Hughes at [facility] for complex repair of the aorta and aortic valve.  The patient specifically denies any chest pain at rest exertion, dyspnea at rest or with exertion, orthopnea, paroxysmal nocturnal dyspnea, syncope, palpitations, focal neurological deficits, intermittent claudication, lower extremity edema, unexplained weight gain, cough, hemoptysis or wheezing.  He still has an occasional musculoskeletal sounding twinge in his right posterior chest that occurs independent of activity.  He continues to exercise, focusing on aerobic, non-resistance training.  At his last appointment we adjusted his blood pressure medications and his systolic blood pressure has improved, although it is still rather high typically in the 140s.  He has a wide pulse pressure and his diastolic blood pressure is often  in the low 50s.  The lowest recorded blood pressure was 120/54.  He has not had any symptoms of dizziness or angina.  He is trying to lose weight again, but is mildly obese with a BMI of 33.  His last echocardiogram performed about a year ago showed left ventricular ejection fraction of 50-55%.  The left ventricle is mildly dilated at 57 mm but with a normal end-systolic diameter of 33 mm.  The aortic insufficiency pressure half-time was very short at 184 ms, consistent with severe aortic insufficiency, although the report quoted  moderate aortic insufficiency.  Past Medical History:  Diagnosis Date  . High cholesterol   . Hypertension     Past Surgical History:  Procedure Laterality Date  . COLONOSCOPY  2012  . REPLACEMENT ASCENDING AORTA N/A 03/31/2016   Procedure: REPLACEMENT OF ASCENDING AORTA USING A 22mm HEMASHIELD PLATINUM VASCULAR GRAFT;  Surgeon: Ivin Poot, MD;  Location: [facility];  Service: Open Heart Surgery;  Laterality: N/A;    Current Medications: Outpatient Medications Prior to Visit  Medication Sig Dispense Refill  . amLODipine (NORVASC) 10 MG tablet Take 1 tablet (10 mg total) by mouth daily. 90 tablet 3  . aspirin 81 MG EC tablet Take 1 tablet (81 mg total) by mouth daily.    Marland Kitchen FERROUS SULFATE PO Take 45 mg by mouth 3 (three) times a week.     Marland Kitchen glucosamine-chondroitin 500-400 MG tablet Take 2 tablets by mouth every morning.    . hydrochlorothiazide (HYDRODIURIL) 25 MG tablet Take 1 tablet (25 mg total) by mouth daily. 30 tablet 0  .  losartan (COZAAR) 100 MG tablet Take 1 tablet (100 mg total) by mouth daily. 90 tablet 3  . Multiple Vitamins-Minerals (MULTIVITAMIN WITH MINERALS) tablet Take 1 tablet by mouth daily.    . simvastatin (ZOCOR) 20 MG tablet Take 1 tablet (20 mg total) by mouth daily. 90 tablet 3  . carvedilol (COREG) 25 MG tablet Take 1 tablet (25 mg total) by mouth 2 (two) times daily. 60 tablet 0   No facility-administered medications prior to visit.        Allergies:   Patient has no known allergies.   Social History   Socioeconomic History  . Marital status: Divorced    Spouse name: Not on file  . Number of children: Not on file  . Years of education: Not on file  . Highest education level: Not on file  Occupational History  . Not on file  Social Needs  . Financial resource strain: Not on file  . Food insecurity:    Worry: Not on file    Inability: Not on file  . Transportation needs:    Medical: Not on file    Non-medical: Not on file  Tobacco Use  . Smoking status: Former Research scientist (life sciences)  . Smokeless tobacco: Former Systems developer    Types: Chew  Substance and Sexual Activity  . Alcohol use: No    Alcohol/week: 0.0 standard drinks  . Drug use: Not on file  . Sexual activity: Not on file  Lifestyle  . Physical activity:    Days per week: Not on file    Minutes per session: Not on file  . Stress: Not on file  Relationships  . Social connections:    Talks on phone: Not on file    Gets together: Not on file    Attends religious service: Not on file    Active member of club or organization: Not on file    Attends meetings of clubs or organizations: Not on file    Relationship status: Not on file  Other Topics Concern  . Not on file  Social History Narrative  . Not on file     Family History:  The patient's family history includes CAD in his mother; Hyperlipidemia in his father and mother; Hypertension in his father and another family member; Prostate cancer in his father.   ROS:   Please see the history of present illness.    ROS all other systems are reviewed and are negative   PHYSICAL EXAM:   VS:  BP (!) 144/62   Pulse 60   Ht 6\' 1"  (1.854 m)   Wt 252 lb (114.3 kg)   BMI 33.25 kg/m     General: Alert, oriented x3, no distress, mildly obese Head: no evidence of trauma, PERRL, EOMI, no exophtalmos or lid lag, no myxedema, no xanthelasma; normal ears, nose and oropharynx Neck: normal jugular venous pulsations and no  hepatojugular reflux; brisk carotid pulses without delay and no carotid bruits Chest: clear to auscultation, no signs of consolidation by percussion or palpation, normal fremitus, symmetrical and full respiratory excursions Cardiovascular: normal position and quality of the apical impulse, regular rhythm, normal first and second heart sounds,2/6 aortic ejection murmur early peaking, 3/6 decrescendo aortic insufficiency murmur heard best at the left lower sternal border Abdomen: no tenderness or distention, no masses by palpation, no abnormal pulsatility or arterial bruits, normal bowel sounds, no hepatosplenomegaly Extremities: no clubbing, cyanosis or edema; 2+ radial, ulnar and brachial pulses bilaterally; 2+ right femoral, posterior tibial and dorsalis pedis  pulses; 2+ left femoral, posterior tibial and dorsalis pedis pulses; no subclavian or femoral bruits Neurological: grossly nonfocal Psych: Normal mood and affect    Wt Readings from Last 3 Encounters:  10/27/18 252 lb (114.3 kg)  07/25/18 249 lb (112.9 kg)  07/16/18 244 lb (110.7 kg)      Studies/Labs Reviewed:   EKG:  EKG is not ordered today.  Most recent tracing shows sinus bradycardia with prominent changes of left ventricular hypertrophy and ST depression and T wave inversion in the lateral leads.  The QRS is broad at 116 ms and the QTC is mildly prolonged at 477 ms Recent Labs: 08/27/2018: ALT 49; BUN 25; Creatinine, Ser 1.04; Potassium 4.4; Sodium 132  Lipid Panel     Component Value Date/Time   CHOL 201 (H) 08/27/2018 1003   TRIG 66 08/27/2018 1003   HDL 67 08/27/2018 1003   CHOLHDL 3.0 08/27/2018 1003   CHOLHDL 3.3 06/11/2016 0815   VLDL 16 06/11/2016 0815   LDLCALC 121 (H) 08/27/2018 1003     ASSESSMENT:    1. Dissection of thoracoabdominal aorta ([place])   2. Nonrheumatic aortic valve insufficiency   3. Benign essential HTN   4. Class 1 obesity due to excess calories with serious comorbidity and body mass index  (BMI) of 33.0 to 33.9 in adult   5. Hypercholesterolemia      PLAN:  In order of problems listed above:  1. S/P Asc Ao Dissection: Clinically Mr. Minihan has done well, but there is evidence of further enlargement of his aorta with development of a pseudoaneurysm.  In addition he appears to have severe aortic insufficiency.  He is eagerly awaiting to hear from Dr. Marchelle Gearing office about an upcoming appointment to discuss surgical therapy.  When he last inquired, the has not yet received a copy of his CT, but he knows that this has been sent, maybe a couple of weeks ago. 2. AI: Whether this is moderate or severe, there is evidence of left ventricular enlargement and borderline left ventricular systolic function, confirming that it is hemodynamically significant: he will require aortic valve resuspension/repair/replacement at the time of his aortic root repair.  He has an extremely broad pulse pressure, consistent with severe AI.   3. HTN: His blood pressure is better, but his systolic blood pressure is still little too high.  Will increase the carvedilol to 37.5 mg twice daily.  He is already on maximum doses of amlodipine and losartan.  We will continue carvedilol did help with bradycardia.  Reassess blood pressure in about 2 weeks. 4. HLP: No known coronary or peripheral vascular disease.  Most recent LDL cholesterol was 121, above target, which is < 100.  However, labs were drawn less than a month after starting treatment with statin.  Will give a few more months to see what happens as he loses weight and the medication fully kicks in. 5. Obesity: Remains mildly obese but is committed to losing weight.  He did a great job with weight loss following his aortic dissection but unfortunately has gained back more than half of his weight loss.  Discussed the fact that his rehabilitation will be easier after his second surgery if he weighs less.  Reminded him not to engage in isotonic, intense physical exercise  such as weight lifting.    Medication Adjustments/Labs and Tests Ordered: Current medicines are reviewed at length with the patient today.  Concerns regarding medicines are outlined above.  Medication changes, Labs and Tests ordered today  are listed in the Patient Instructions below. Patient Instructions  Medication Instructions:  INCREASE Coreg to 37.5mg  take 1 tablet twice a day If you need a refill on your cardiac medications before your next appointment, please call your pharmacy.   Lab work: None  If you have labs (blood work) drawn today and your tests are completely normal, you will receive your results only by: Marland Kitchen MyChart Message (if you have MyChart) OR . A paper copy in the mail If you have any lab test that is abnormal or we need to change your treatment, we will call you to review the results.  Testing/Procedures: None   Follow-Up: At [facility], you and your health needs are our priority.  As part of our continuing mission to provide you with exceptional heart care, we have created designated Provider Care Teams.  These Care Teams include your primary Cardiologist (physician) and Advanced Practice Providers (APPs -  Physician Assistants and Nurse Practitioners) who all work together to provide you with the care you need, when you need it. You will need a follow up appointment in 6 months.  Please call our office 2 months in advance to schedule this appointment.  You may see Sanda Klein, MD or one of the following Advanced Practice Providers on your designated Care Team: [place], [place] . Fabian Sharp, PA-C  Any Other Special Instructions Will Be Listed Below (If Applicable). Send your blood pressure readings via mychart in 10 days.     Signed, Sanda Klein, MD  10/28/2018 8:43 AM    [facility] [place], [place], [place]  [address] Phone: [phone]; Fax: [phone]

## 2018-10-28 ENCOUNTER — Encounter: Payer: Self-pay | Admitting: Cardiovascular Disease

## 2018-11-10 ENCOUNTER — Telehealth: Payer: Self-pay

## 2018-11-10 NOTE — Telephone Encounter (Signed)
Called patient left message on personal voice mail, received your email today with your B/P readings.Dr.Croitoru is out of office this week, he will review readings when he is back in office.Advised to call me back if you have any questions or concerns.

## 2018-11-17 DIAGNOSIS — Z9889 Other specified postprocedural states: Secondary | ICD-10-CM | POA: Diagnosis not present

## 2018-11-17 DIAGNOSIS — I10 Essential (primary) hypertension: Secondary | ICD-10-CM | POA: Diagnosis not present

## 2018-11-17 DIAGNOSIS — I712 Thoracic aortic aneurysm, without rupture: Secondary | ICD-10-CM | POA: Diagnosis not present

## 2018-11-17 DIAGNOSIS — I351 Nonrheumatic aortic (valve) insufficiency: Secondary | ICD-10-CM | POA: Diagnosis not present

## 2018-11-17 DIAGNOSIS — Z8679 Personal history of other diseases of the circulatory system: Secondary | ICD-10-CM | POA: Diagnosis not present

## 2018-11-17 DIAGNOSIS — I7103 Dissection of thoracoabdominal aorta: Secondary | ICD-10-CM | POA: Diagnosis not present

## 2018-11-17 DIAGNOSIS — R5383 Other fatigue: Secondary | ICD-10-CM | POA: Diagnosis not present

## 2018-11-17 DIAGNOSIS — Z87891 Personal history of nicotine dependence: Secondary | ICD-10-CM | POA: Diagnosis not present

## 2018-11-17 DIAGNOSIS — I716 Thoracoabdominal aortic aneurysm, without rupture: Secondary | ICD-10-CM | POA: Diagnosis not present

## 2018-11-17 DIAGNOSIS — Z79899 Other long term (current) drug therapy: Secondary | ICD-10-CM | POA: Diagnosis not present

## 2018-12-30 DIAGNOSIS — I509 Heart failure, unspecified: Secondary | ICD-10-CM | POA: Diagnosis not present

## 2018-12-30 DIAGNOSIS — Z1159 Encounter for screening for other viral diseases: Secondary | ICD-10-CM | POA: Diagnosis not present

## 2018-12-30 DIAGNOSIS — Z9889 Other specified postprocedural states: Secondary | ICD-10-CM | POA: Diagnosis not present

## 2018-12-30 DIAGNOSIS — R1312 Dysphagia, oropharyngeal phase: Secondary | ICD-10-CM | POA: Diagnosis not present

## 2018-12-30 DIAGNOSIS — D62 Acute posthemorrhagic anemia: Secondary | ICD-10-CM | POA: Diagnosis not present

## 2018-12-30 DIAGNOSIS — J90 Pleural effusion, not elsewhere classified: Secondary | ICD-10-CM | POA: Diagnosis not present

## 2018-12-30 DIAGNOSIS — I2510 Atherosclerotic heart disease of native coronary artery without angina pectoris: Secondary | ICD-10-CM | POA: Diagnosis not present

## 2018-12-30 DIAGNOSIS — K660 Peritoneal adhesions (postprocedural) (postinfection): Secondary | ICD-10-CM | POA: Diagnosis not present

## 2018-12-30 DIAGNOSIS — I700 Atherosclerosis of aorta: Secondary | ICD-10-CM | POA: Diagnosis not present

## 2018-12-30 DIAGNOSIS — J9811 Atelectasis: Secondary | ICD-10-CM | POA: Diagnosis not present

## 2018-12-30 DIAGNOSIS — E785 Hyperlipidemia, unspecified: Secondary | ICD-10-CM | POA: Diagnosis not present

## 2018-12-30 DIAGNOSIS — Z48812 Encounter for surgical aftercare following surgery on the circulatory system: Secondary | ICD-10-CM | POA: Diagnosis not present

## 2018-12-30 DIAGNOSIS — I712 Thoracic aortic aneurysm, without rupture: Secondary | ICD-10-CM | POA: Diagnosis not present

## 2018-12-30 DIAGNOSIS — R339 Retention of urine, unspecified: Secondary | ICD-10-CM | POA: Diagnosis not present

## 2018-12-30 DIAGNOSIS — I7100 Dissection of unspecified site of aorta: Secondary | ICD-10-CM | POA: Diagnosis not present

## 2018-12-30 DIAGNOSIS — I110 Hypertensive heart disease with heart failure: Secondary | ICD-10-CM | POA: Diagnosis not present

## 2018-12-30 DIAGNOSIS — Z01810 Encounter for preprocedural cardiovascular examination: Secondary | ICD-10-CM | POA: Diagnosis not present

## 2018-12-30 DIAGNOSIS — J811 Chronic pulmonary edema: Secondary | ICD-10-CM | POA: Diagnosis not present

## 2018-12-30 DIAGNOSIS — Z8679 Personal history of other diseases of the circulatory system: Secondary | ICD-10-CM | POA: Diagnosis not present

## 2018-12-30 DIAGNOSIS — Z9049 Acquired absence of other specified parts of digestive tract: Secondary | ICD-10-CM | POA: Diagnosis not present

## 2018-12-30 DIAGNOSIS — J948 Other specified pleural conditions: Secondary | ICD-10-CM | POA: Diagnosis not present

## 2018-12-30 DIAGNOSIS — I351 Nonrheumatic aortic (valve) insufficiency: Secondary | ICD-10-CM | POA: Diagnosis not present

## 2018-12-30 DIAGNOSIS — I719 Aortic aneurysm of unspecified site, without rupture: Secondary | ICD-10-CM | POA: Diagnosis not present

## 2018-12-30 DIAGNOSIS — Z7982 Long term (current) use of aspirin: Secondary | ICD-10-CM | POA: Diagnosis not present

## 2018-12-30 DIAGNOSIS — E871 Hypo-osmolality and hyponatremia: Secondary | ICD-10-CM | POA: Diagnosis not present

## 2018-12-30 DIAGNOSIS — R918 Other nonspecific abnormal finding of lung field: Secondary | ICD-10-CM | POA: Diagnosis not present

## 2018-12-30 DIAGNOSIS — I517 Cardiomegaly: Secondary | ICD-10-CM | POA: Diagnosis not present

## 2018-12-30 DIAGNOSIS — Q2543 Congenital aneurysm of aorta: Secondary | ICD-10-CM | POA: Diagnosis not present

## 2018-12-30 DIAGNOSIS — I5032 Chronic diastolic (congestive) heart failure: Secondary | ICD-10-CM | POA: Diagnosis not present

## 2018-12-30 DIAGNOSIS — G8918 Other acute postprocedural pain: Secondary | ICD-10-CM | POA: Diagnosis not present

## 2018-12-30 DIAGNOSIS — Z952 Presence of prosthetic heart valve: Secondary | ICD-10-CM | POA: Diagnosis not present

## 2018-12-30 DIAGNOSIS — I7101 Dissection of thoracic aorta: Secondary | ICD-10-CM | POA: Diagnosis not present

## 2018-12-31 DIAGNOSIS — I517 Cardiomegaly: Secondary | ICD-10-CM | POA: Diagnosis not present

## 2018-12-31 DIAGNOSIS — I2510 Atherosclerotic heart disease of native coronary artery without angina pectoris: Secondary | ICD-10-CM | POA: Diagnosis not present

## 2018-12-31 DIAGNOSIS — I351 Nonrheumatic aortic (valve) insufficiency: Secondary | ICD-10-CM | POA: Diagnosis not present

## 2018-12-31 DIAGNOSIS — Z01810 Encounter for preprocedural cardiovascular examination: Secondary | ICD-10-CM | POA: Diagnosis not present

## 2018-12-31 DIAGNOSIS — I712 Thoracic aortic aneurysm, without rupture: Secondary | ICD-10-CM | POA: Diagnosis not present

## 2018-12-31 DIAGNOSIS — I719 Aortic aneurysm of unspecified site, without rupture: Secondary | ICD-10-CM | POA: Diagnosis not present

## 2018-12-31 DIAGNOSIS — I7101 Dissection of thoracic aorta: Secondary | ICD-10-CM | POA: Diagnosis not present

## 2019-01-01 DIAGNOSIS — I719 Aortic aneurysm of unspecified site, without rupture: Secondary | ICD-10-CM | POA: Diagnosis not present

## 2019-01-01 DIAGNOSIS — I712 Thoracic aortic aneurysm, without rupture: Secondary | ICD-10-CM | POA: Diagnosis not present

## 2019-01-01 DIAGNOSIS — I7101 Dissection of thoracic aorta: Secondary | ICD-10-CM | POA: Diagnosis not present

## 2019-01-01 DIAGNOSIS — Z8679 Personal history of other diseases of the circulatory system: Secondary | ICD-10-CM | POA: Diagnosis not present

## 2019-01-01 DIAGNOSIS — J90 Pleural effusion, not elsewhere classified: Secondary | ICD-10-CM | POA: Diagnosis not present

## 2019-01-01 DIAGNOSIS — I700 Atherosclerosis of aorta: Secondary | ICD-10-CM | POA: Diagnosis not present

## 2019-01-01 DIAGNOSIS — Z9889 Other specified postprocedural states: Secondary | ICD-10-CM | POA: Diagnosis not present

## 2019-01-01 DIAGNOSIS — R918 Other nonspecific abnormal finding of lung field: Secondary | ICD-10-CM | POA: Diagnosis not present

## 2019-01-01 DIAGNOSIS — I351 Nonrheumatic aortic (valve) insufficiency: Secondary | ICD-10-CM | POA: Diagnosis not present

## 2019-01-01 DIAGNOSIS — I509 Heart failure, unspecified: Secondary | ICD-10-CM | POA: Diagnosis not present

## 2019-01-02 DIAGNOSIS — J811 Chronic pulmonary edema: Secondary | ICD-10-CM | POA: Diagnosis not present

## 2019-01-02 DIAGNOSIS — I719 Aortic aneurysm of unspecified site, without rupture: Secondary | ICD-10-CM | POA: Diagnosis not present

## 2019-01-02 DIAGNOSIS — I517 Cardiomegaly: Secondary | ICD-10-CM | POA: Diagnosis not present

## 2019-01-02 DIAGNOSIS — J90 Pleural effusion, not elsewhere classified: Secondary | ICD-10-CM | POA: Diagnosis not present

## 2019-01-02 DIAGNOSIS — Z952 Presence of prosthetic heart valve: Secondary | ICD-10-CM | POA: Diagnosis not present

## 2019-01-03 DIAGNOSIS — I517 Cardiomegaly: Secondary | ICD-10-CM | POA: Diagnosis not present

## 2019-01-03 DIAGNOSIS — I719 Aortic aneurysm of unspecified site, without rupture: Secondary | ICD-10-CM | POA: Diagnosis not present

## 2019-01-03 DIAGNOSIS — J9811 Atelectasis: Secondary | ICD-10-CM | POA: Diagnosis not present

## 2019-01-03 DIAGNOSIS — J90 Pleural effusion, not elsewhere classified: Secondary | ICD-10-CM | POA: Diagnosis not present

## 2019-01-04 DIAGNOSIS — I351 Nonrheumatic aortic (valve) insufficiency: Secondary | ICD-10-CM | POA: Diagnosis not present

## 2019-01-04 DIAGNOSIS — R1312 Dysphagia, oropharyngeal phase: Secondary | ICD-10-CM | POA: Diagnosis not present

## 2019-01-04 DIAGNOSIS — J948 Other specified pleural conditions: Secondary | ICD-10-CM | POA: Diagnosis not present

## 2019-01-04 DIAGNOSIS — R918 Other nonspecific abnormal finding of lung field: Secondary | ICD-10-CM | POA: Diagnosis not present

## 2019-01-04 DIAGNOSIS — J90 Pleural effusion, not elsewhere classified: Secondary | ICD-10-CM | POA: Diagnosis not present

## 2019-01-05 DIAGNOSIS — J90 Pleural effusion, not elsewhere classified: Secondary | ICD-10-CM | POA: Diagnosis not present

## 2019-01-05 DIAGNOSIS — I719 Aortic aneurysm of unspecified site, without rupture: Secondary | ICD-10-CM | POA: Diagnosis not present

## 2019-01-05 DIAGNOSIS — Z9889 Other specified postprocedural states: Secondary | ICD-10-CM | POA: Diagnosis not present

## 2019-01-05 DIAGNOSIS — R918 Other nonspecific abnormal finding of lung field: Secondary | ICD-10-CM | POA: Diagnosis not present

## 2019-01-06 ENCOUNTER — Other Ambulatory Visit: Payer: Self-pay | Admitting: Cardiovascular Disease

## 2019-01-06 DIAGNOSIS — I7100 Dissection of unspecified site of aorta: Secondary | ICD-10-CM | POA: Diagnosis not present

## 2019-01-06 DIAGNOSIS — I71019 Dissection of thoracic aorta, unspecified: Secondary | ICD-10-CM

## 2019-01-06 DIAGNOSIS — J9811 Atelectasis: Secondary | ICD-10-CM | POA: Diagnosis not present

## 2019-01-06 DIAGNOSIS — D62 Acute posthemorrhagic anemia: Secondary | ICD-10-CM

## 2019-01-06 DIAGNOSIS — J90 Pleural effusion, not elsewhere classified: Secondary | ICD-10-CM | POA: Diagnosis not present

## 2019-01-06 DIAGNOSIS — I712 Thoracic aortic aneurysm, without rupture: Secondary | ICD-10-CM | POA: Diagnosis not present

## 2019-01-06 DIAGNOSIS — I7101 Dissection of thoracic aorta: Secondary | ICD-10-CM

## 2019-01-06 NOTE — Telephone Encounter (Signed)
Amlodipine refilled.

## 2019-01-07 DIAGNOSIS — Z8679 Personal history of other diseases of the circulatory system: Secondary | ICD-10-CM | POA: Diagnosis not present

## 2019-01-07 DIAGNOSIS — R918 Other nonspecific abnormal finding of lung field: Secondary | ICD-10-CM | POA: Diagnosis not present

## 2019-01-07 DIAGNOSIS — Z9889 Other specified postprocedural states: Secondary | ICD-10-CM | POA: Diagnosis not present

## 2019-01-08 DIAGNOSIS — I719 Aortic aneurysm of unspecified site, without rupture: Secondary | ICD-10-CM | POA: Diagnosis not present

## 2019-01-08 DIAGNOSIS — I712 Thoracic aortic aneurysm, without rupture: Secondary | ICD-10-CM | POA: Diagnosis not present

## 2019-01-08 DIAGNOSIS — J9811 Atelectasis: Secondary | ICD-10-CM | POA: Diagnosis not present

## 2019-01-08 DIAGNOSIS — Z9889 Other specified postprocedural states: Secondary | ICD-10-CM | POA: Diagnosis not present

## 2019-01-08 DIAGNOSIS — I7101 Dissection of thoracic aorta: Secondary | ICD-10-CM | POA: Diagnosis not present

## 2019-01-08 DIAGNOSIS — R918 Other nonspecific abnormal finding of lung field: Secondary | ICD-10-CM | POA: Diagnosis not present

## 2019-01-08 DIAGNOSIS — J90 Pleural effusion, not elsewhere classified: Secondary | ICD-10-CM | POA: Diagnosis not present

## 2019-01-08 DIAGNOSIS — R1312 Dysphagia, oropharyngeal phase: Secondary | ICD-10-CM | POA: Diagnosis not present

## 2019-01-09 DIAGNOSIS — J90 Pleural effusion, not elsewhere classified: Secondary | ICD-10-CM | POA: Diagnosis not present

## 2019-01-09 DIAGNOSIS — J948 Other specified pleural conditions: Secondary | ICD-10-CM | POA: Diagnosis not present

## 2019-01-09 DIAGNOSIS — Z8679 Personal history of other diseases of the circulatory system: Secondary | ICD-10-CM | POA: Diagnosis not present

## 2019-01-09 DIAGNOSIS — J9811 Atelectasis: Secondary | ICD-10-CM | POA: Diagnosis not present

## 2019-01-09 DIAGNOSIS — Z9889 Other specified postprocedural states: Secondary | ICD-10-CM | POA: Diagnosis not present

## 2019-01-10 DIAGNOSIS — Z952 Presence of prosthetic heart valve: Secondary | ICD-10-CM | POA: Diagnosis not present

## 2019-01-10 DIAGNOSIS — Z48812 Encounter for surgical aftercare following surgery on the circulatory system: Secondary | ICD-10-CM | POA: Diagnosis not present

## 2019-01-13 DIAGNOSIS — I110 Hypertensive heart disease with heart failure: Secondary | ICD-10-CM | POA: Diagnosis not present

## 2019-01-13 DIAGNOSIS — I712 Thoracic aortic aneurysm, without rupture: Secondary | ICD-10-CM | POA: Diagnosis not present

## 2019-01-13 DIAGNOSIS — Z7982 Long term (current) use of aspirin: Secondary | ICD-10-CM | POA: Diagnosis not present

## 2019-01-13 DIAGNOSIS — Z95818 Presence of other cardiac implants and grafts: Secondary | ICD-10-CM | POA: Diagnosis not present

## 2019-01-13 DIAGNOSIS — D500 Iron deficiency anemia secondary to blood loss (chronic): Secondary | ICD-10-CM | POA: Diagnosis not present

## 2019-01-13 DIAGNOSIS — E785 Hyperlipidemia, unspecified: Secondary | ICD-10-CM | POA: Diagnosis not present

## 2019-01-13 DIAGNOSIS — I5032 Chronic diastolic (congestive) heart failure: Secondary | ICD-10-CM | POA: Diagnosis not present

## 2019-01-13 DIAGNOSIS — Z954 Presence of other heart-valve replacement: Secondary | ICD-10-CM | POA: Diagnosis not present

## 2019-01-13 DIAGNOSIS — Z9049 Acquired absence of other specified parts of digestive tract: Secondary | ICD-10-CM | POA: Diagnosis not present

## 2019-01-13 DIAGNOSIS — I2510 Atherosclerotic heart disease of native coronary artery without angina pectoris: Secondary | ICD-10-CM | POA: Diagnosis not present

## 2019-01-13 DIAGNOSIS — Z87891 Personal history of nicotine dependence: Secondary | ICD-10-CM | POA: Diagnosis not present

## 2019-01-13 DIAGNOSIS — Z48812 Encounter for surgical aftercare following surgery on the circulatory system: Secondary | ICD-10-CM | POA: Diagnosis not present

## 2019-01-13 DIAGNOSIS — Z6835 Body mass index (BMI) 35.0-35.9, adult: Secondary | ICD-10-CM | POA: Diagnosis not present

## 2019-01-16 DIAGNOSIS — Z87891 Personal history of nicotine dependence: Secondary | ICD-10-CM | POA: Diagnosis not present

## 2019-01-16 DIAGNOSIS — I712 Thoracic aortic aneurysm, without rupture: Secondary | ICD-10-CM | POA: Diagnosis not present

## 2019-01-16 DIAGNOSIS — Z48812 Encounter for surgical aftercare following surgery on the circulatory system: Secondary | ICD-10-CM | POA: Diagnosis not present

## 2019-01-16 DIAGNOSIS — I2510 Atherosclerotic heart disease of native coronary artery without angina pectoris: Secondary | ICD-10-CM | POA: Diagnosis not present

## 2019-01-16 DIAGNOSIS — Z95818 Presence of other cardiac implants and grafts: Secondary | ICD-10-CM | POA: Diagnosis not present

## 2019-01-16 DIAGNOSIS — I110 Hypertensive heart disease with heart failure: Secondary | ICD-10-CM | POA: Diagnosis not present

## 2019-01-16 DIAGNOSIS — D500 Iron deficiency anemia secondary to blood loss (chronic): Secondary | ICD-10-CM | POA: Diagnosis not present

## 2019-01-16 DIAGNOSIS — I5032 Chronic diastolic (congestive) heart failure: Secondary | ICD-10-CM | POA: Diagnosis not present

## 2019-01-16 DIAGNOSIS — Z7982 Long term (current) use of aspirin: Secondary | ICD-10-CM | POA: Diagnosis not present

## 2019-01-16 DIAGNOSIS — Z6835 Body mass index (BMI) 35.0-35.9, adult: Secondary | ICD-10-CM | POA: Diagnosis not present

## 2019-01-16 DIAGNOSIS — Z954 Presence of other heart-valve replacement: Secondary | ICD-10-CM | POA: Diagnosis not present

## 2019-01-16 DIAGNOSIS — Z9049 Acquired absence of other specified parts of digestive tract: Secondary | ICD-10-CM | POA: Diagnosis not present

## 2019-01-16 DIAGNOSIS — E785 Hyperlipidemia, unspecified: Secondary | ICD-10-CM | POA: Diagnosis not present

## 2019-01-21 DIAGNOSIS — I2510 Atherosclerotic heart disease of native coronary artery without angina pectoris: Secondary | ICD-10-CM | POA: Diagnosis not present

## 2019-01-21 DIAGNOSIS — Z954 Presence of other heart-valve replacement: Secondary | ICD-10-CM | POA: Diagnosis not present

## 2019-01-21 DIAGNOSIS — Z6835 Body mass index (BMI) 35.0-35.9, adult: Secondary | ICD-10-CM | POA: Diagnosis not present

## 2019-01-21 DIAGNOSIS — I712 Thoracic aortic aneurysm, without rupture: Secondary | ICD-10-CM | POA: Diagnosis not present

## 2019-01-21 DIAGNOSIS — I110 Hypertensive heart disease with heart failure: Secondary | ICD-10-CM | POA: Diagnosis not present

## 2019-01-21 DIAGNOSIS — Z7982 Long term (current) use of aspirin: Secondary | ICD-10-CM | POA: Diagnosis not present

## 2019-01-21 DIAGNOSIS — Z87891 Personal history of nicotine dependence: Secondary | ICD-10-CM | POA: Diagnosis not present

## 2019-01-21 DIAGNOSIS — Z9049 Acquired absence of other specified parts of digestive tract: Secondary | ICD-10-CM | POA: Diagnosis not present

## 2019-01-21 DIAGNOSIS — Z48812 Encounter for surgical aftercare following surgery on the circulatory system: Secondary | ICD-10-CM | POA: Diagnosis not present

## 2019-01-21 DIAGNOSIS — D500 Iron deficiency anemia secondary to blood loss (chronic): Secondary | ICD-10-CM | POA: Diagnosis not present

## 2019-01-21 DIAGNOSIS — I5032 Chronic diastolic (congestive) heart failure: Secondary | ICD-10-CM | POA: Diagnosis not present

## 2019-01-21 DIAGNOSIS — Z95818 Presence of other cardiac implants and grafts: Secondary | ICD-10-CM | POA: Diagnosis not present

## 2019-01-21 DIAGNOSIS — E785 Hyperlipidemia, unspecified: Secondary | ICD-10-CM | POA: Diagnosis not present

## 2019-01-26 DIAGNOSIS — Z952 Presence of prosthetic heart valve: Secondary | ICD-10-CM | POA: Diagnosis not present

## 2019-01-26 DIAGNOSIS — I712 Thoracic aortic aneurysm, without rupture: Secondary | ICD-10-CM | POA: Diagnosis not present

## 2019-01-26 DIAGNOSIS — Z8679 Personal history of other diseases of the circulatory system: Secondary | ICD-10-CM | POA: Diagnosis not present

## 2019-01-26 DIAGNOSIS — Z9889 Other specified postprocedural states: Secondary | ICD-10-CM | POA: Diagnosis not present

## 2019-01-27 DIAGNOSIS — Z954 Presence of other heart-valve replacement: Secondary | ICD-10-CM | POA: Diagnosis not present

## 2019-01-27 DIAGNOSIS — D500 Iron deficiency anemia secondary to blood loss (chronic): Secondary | ICD-10-CM | POA: Diagnosis not present

## 2019-01-27 DIAGNOSIS — Z48812 Encounter for surgical aftercare following surgery on the circulatory system: Secondary | ICD-10-CM | POA: Diagnosis not present

## 2019-01-27 DIAGNOSIS — I712 Thoracic aortic aneurysm, without rupture: Secondary | ICD-10-CM | POA: Diagnosis not present

## 2019-01-27 DIAGNOSIS — Z7982 Long term (current) use of aspirin: Secondary | ICD-10-CM | POA: Diagnosis not present

## 2019-01-27 DIAGNOSIS — I5032 Chronic diastolic (congestive) heart failure: Secondary | ICD-10-CM | POA: Diagnosis not present

## 2019-01-27 DIAGNOSIS — Z6835 Body mass index (BMI) 35.0-35.9, adult: Secondary | ICD-10-CM | POA: Diagnosis not present

## 2019-01-27 DIAGNOSIS — Z95818 Presence of other cardiac implants and grafts: Secondary | ICD-10-CM | POA: Diagnosis not present

## 2019-01-27 DIAGNOSIS — Z87891 Personal history of nicotine dependence: Secondary | ICD-10-CM | POA: Diagnosis not present

## 2019-01-27 DIAGNOSIS — E785 Hyperlipidemia, unspecified: Secondary | ICD-10-CM | POA: Diagnosis not present

## 2019-01-27 DIAGNOSIS — Z9049 Acquired absence of other specified parts of digestive tract: Secondary | ICD-10-CM | POA: Diagnosis not present

## 2019-01-27 DIAGNOSIS — I110 Hypertensive heart disease with heart failure: Secondary | ICD-10-CM | POA: Diagnosis not present

## 2019-01-27 DIAGNOSIS — I2510 Atherosclerotic heart disease of native coronary artery without angina pectoris: Secondary | ICD-10-CM | POA: Diagnosis not present

## 2019-01-30 DIAGNOSIS — D500 Iron deficiency anemia secondary to blood loss (chronic): Secondary | ICD-10-CM | POA: Diagnosis not present

## 2019-01-30 DIAGNOSIS — I110 Hypertensive heart disease with heart failure: Secondary | ICD-10-CM | POA: Diagnosis not present

## 2019-01-30 DIAGNOSIS — Z954 Presence of other heart-valve replacement: Secondary | ICD-10-CM | POA: Diagnosis not present

## 2019-01-30 DIAGNOSIS — Z95818 Presence of other cardiac implants and grafts: Secondary | ICD-10-CM | POA: Diagnosis not present

## 2019-01-30 DIAGNOSIS — Z6835 Body mass index (BMI) 35.0-35.9, adult: Secondary | ICD-10-CM | POA: Diagnosis not present

## 2019-01-30 DIAGNOSIS — I2510 Atherosclerotic heart disease of native coronary artery without angina pectoris: Secondary | ICD-10-CM | POA: Diagnosis not present

## 2019-01-30 DIAGNOSIS — Z9049 Acquired absence of other specified parts of digestive tract: Secondary | ICD-10-CM | POA: Diagnosis not present

## 2019-01-30 DIAGNOSIS — E785 Hyperlipidemia, unspecified: Secondary | ICD-10-CM | POA: Diagnosis not present

## 2019-01-30 DIAGNOSIS — Z7982 Long term (current) use of aspirin: Secondary | ICD-10-CM | POA: Diagnosis not present

## 2019-01-30 DIAGNOSIS — I712 Thoracic aortic aneurysm, without rupture: Secondary | ICD-10-CM | POA: Diagnosis not present

## 2019-01-30 DIAGNOSIS — Z48812 Encounter for surgical aftercare following surgery on the circulatory system: Secondary | ICD-10-CM | POA: Diagnosis not present

## 2019-01-30 DIAGNOSIS — Z87891 Personal history of nicotine dependence: Secondary | ICD-10-CM | POA: Diagnosis not present

## 2019-01-30 DIAGNOSIS — I5032 Chronic diastolic (congestive) heart failure: Secondary | ICD-10-CM | POA: Diagnosis not present

## 2019-02-02 DIAGNOSIS — Z4802 Encounter for removal of sutures: Secondary | ICD-10-CM | POA: Diagnosis not present

## 2019-02-03 DIAGNOSIS — Z48812 Encounter for surgical aftercare following surgery on the circulatory system: Secondary | ICD-10-CM | POA: Diagnosis not present

## 2019-02-03 DIAGNOSIS — I712 Thoracic aortic aneurysm, without rupture: Secondary | ICD-10-CM | POA: Diagnosis not present

## 2019-02-03 DIAGNOSIS — Z6835 Body mass index (BMI) 35.0-35.9, adult: Secondary | ICD-10-CM | POA: Diagnosis not present

## 2019-02-03 DIAGNOSIS — Z7982 Long term (current) use of aspirin: Secondary | ICD-10-CM | POA: Diagnosis not present

## 2019-02-03 DIAGNOSIS — Z87891 Personal history of nicotine dependence: Secondary | ICD-10-CM | POA: Diagnosis not present

## 2019-02-03 DIAGNOSIS — I5032 Chronic diastolic (congestive) heart failure: Secondary | ICD-10-CM | POA: Diagnosis not present

## 2019-02-03 DIAGNOSIS — Z95818 Presence of other cardiac implants and grafts: Secondary | ICD-10-CM | POA: Diagnosis not present

## 2019-02-03 DIAGNOSIS — Z954 Presence of other heart-valve replacement: Secondary | ICD-10-CM | POA: Diagnosis not present

## 2019-02-03 DIAGNOSIS — I110 Hypertensive heart disease with heart failure: Secondary | ICD-10-CM | POA: Diagnosis not present

## 2019-02-03 DIAGNOSIS — I2510 Atherosclerotic heart disease of native coronary artery without angina pectoris: Secondary | ICD-10-CM | POA: Diagnosis not present

## 2019-02-03 DIAGNOSIS — Z9049 Acquired absence of other specified parts of digestive tract: Secondary | ICD-10-CM | POA: Diagnosis not present

## 2019-02-03 DIAGNOSIS — D500 Iron deficiency anemia secondary to blood loss (chronic): Secondary | ICD-10-CM | POA: Diagnosis not present

## 2019-02-03 DIAGNOSIS — E785 Hyperlipidemia, unspecified: Secondary | ICD-10-CM | POA: Diagnosis not present

## 2019-02-10 DIAGNOSIS — Z95818 Presence of other cardiac implants and grafts: Secondary | ICD-10-CM | POA: Diagnosis not present

## 2019-02-10 DIAGNOSIS — E785 Hyperlipidemia, unspecified: Secondary | ICD-10-CM | POA: Diagnosis not present

## 2019-02-10 DIAGNOSIS — D500 Iron deficiency anemia secondary to blood loss (chronic): Secondary | ICD-10-CM | POA: Diagnosis not present

## 2019-02-10 DIAGNOSIS — Z954 Presence of other heart-valve replacement: Secondary | ICD-10-CM | POA: Diagnosis not present

## 2019-02-10 DIAGNOSIS — Z87891 Personal history of nicotine dependence: Secondary | ICD-10-CM | POA: Diagnosis not present

## 2019-02-10 DIAGNOSIS — Z6835 Body mass index (BMI) 35.0-35.9, adult: Secondary | ICD-10-CM | POA: Diagnosis not present

## 2019-02-10 DIAGNOSIS — I2510 Atherosclerotic heart disease of native coronary artery without angina pectoris: Secondary | ICD-10-CM | POA: Diagnosis not present

## 2019-02-10 DIAGNOSIS — I5032 Chronic diastolic (congestive) heart failure: Secondary | ICD-10-CM | POA: Diagnosis not present

## 2019-02-10 DIAGNOSIS — I110 Hypertensive heart disease with heart failure: Secondary | ICD-10-CM | POA: Diagnosis not present

## 2019-02-10 DIAGNOSIS — Z7982 Long term (current) use of aspirin: Secondary | ICD-10-CM | POA: Diagnosis not present

## 2019-02-10 DIAGNOSIS — Z48812 Encounter for surgical aftercare following surgery on the circulatory system: Secondary | ICD-10-CM | POA: Diagnosis not present

## 2019-02-10 DIAGNOSIS — I712 Thoracic aortic aneurysm, without rupture: Secondary | ICD-10-CM | POA: Diagnosis not present

## 2019-02-10 DIAGNOSIS — Z9049 Acquired absence of other specified parts of digestive tract: Secondary | ICD-10-CM | POA: Diagnosis not present

## 2019-03-11 ENCOUNTER — Encounter: Payer: Self-pay | Admitting: Thoracic Surgery (Cardiothoracic Vascular Surgery)

## 2019-03-12 ENCOUNTER — Encounter: Payer: Self-pay | Admitting: Thoracic Surgery (Cardiothoracic Vascular Surgery)

## 2019-03-13 DIAGNOSIS — Z6835 Body mass index (BMI) 35.0-35.9, adult: Secondary | ICD-10-CM | POA: Diagnosis not present

## 2019-03-13 DIAGNOSIS — Z48812 Encounter for surgical aftercare following surgery on the circulatory system: Secondary | ICD-10-CM | POA: Diagnosis not present

## 2019-03-13 DIAGNOSIS — Z9049 Acquired absence of other specified parts of digestive tract: Secondary | ICD-10-CM | POA: Diagnosis not present

## 2019-03-13 DIAGNOSIS — Z7982 Long term (current) use of aspirin: Secondary | ICD-10-CM | POA: Diagnosis not present

## 2019-03-13 DIAGNOSIS — E785 Hyperlipidemia, unspecified: Secondary | ICD-10-CM | POA: Diagnosis not present

## 2019-03-13 DIAGNOSIS — Z95818 Presence of other cardiac implants and grafts: Secondary | ICD-10-CM | POA: Diagnosis not present

## 2019-03-13 DIAGNOSIS — I5032 Chronic diastolic (congestive) heart failure: Secondary | ICD-10-CM | POA: Diagnosis not present

## 2019-03-13 DIAGNOSIS — Z954 Presence of other heart-valve replacement: Secondary | ICD-10-CM | POA: Diagnosis not present

## 2019-03-13 DIAGNOSIS — I110 Hypertensive heart disease with heart failure: Secondary | ICD-10-CM | POA: Diagnosis not present

## 2019-03-13 DIAGNOSIS — I712 Thoracic aortic aneurysm, without rupture: Secondary | ICD-10-CM | POA: Diagnosis not present

## 2019-03-13 DIAGNOSIS — I2510 Atherosclerotic heart disease of native coronary artery without angina pectoris: Secondary | ICD-10-CM | POA: Diagnosis not present

## 2019-03-13 DIAGNOSIS — D500 Iron deficiency anemia secondary to blood loss (chronic): Secondary | ICD-10-CM | POA: Diagnosis not present

## 2019-03-13 DIAGNOSIS — Z87891 Personal history of nicotine dependence: Secondary | ICD-10-CM | POA: Diagnosis not present

## 2019-05-01 DIAGNOSIS — Z952 Presence of prosthetic heart valve: Secondary | ICD-10-CM | POA: Diagnosis not present

## 2019-05-01 DIAGNOSIS — Z953 Presence of xenogenic heart valve: Secondary | ICD-10-CM | POA: Diagnosis not present

## 2019-05-01 DIAGNOSIS — Z8679 Personal history of other diseases of the circulatory system: Secondary | ICD-10-CM | POA: Diagnosis not present

## 2019-05-01 DIAGNOSIS — I7102 Dissection of abdominal aorta: Secondary | ICD-10-CM | POA: Diagnosis not present

## 2019-05-01 DIAGNOSIS — Z9889 Other specified postprocedural states: Secondary | ICD-10-CM | POA: Diagnosis not present

## 2019-05-01 DIAGNOSIS — I712 Thoracic aortic aneurysm, without rupture: Secondary | ICD-10-CM | POA: Diagnosis not present

## 2019-05-04 ENCOUNTER — Telehealth: Payer: Self-pay | Admitting: *Deleted

## 2019-05-04 NOTE — Telephone Encounter (Signed)
Virtual Visit Pre-Appointment Phone Call  "(Name), I am calling you today to discuss your upcoming appointment. We are currently trying to limit exposure to the virus that causes COVID-19 by seeing patients at home rather than in the office."  1. "What is the BEST phone number to call the day of the visit?" - include this in appointment notes  2. "Do you have or have access to (through a family member/friend) a smartphone with video capability that we can use for your visit?" a. If yes - list this number in appt notes as "cell" (if different from BEST phone #) and list the appointment type as a VIDEO visit in appointment notes b. If no - list the appointment type as a PHONE visit in appointment notes  3. Confirm consent - "In the setting of the current Covid19 crisis, you are scheduled for a (phone or video) visit with your provider on (date) at (time).  Just as we do with many in-office visits, in order for you to participate in this visit, we must obtain consent.  If you'd like, I can send this to your mychart (if signed up) or email for you to review.  Otherwise, I can obtain your verbal consent now.  All virtual visits are billed to your insurance company just like a normal visit would be.  By agreeing to a virtual visit, we'd like you to understand that the technology does not allow for your provider to perform an examination, and thus may limit your provider's ability to fully assess your condition. If your provider identifies any concerns that need to be evaluated in person, we will make arrangements to do so.  Finally, though the technology is pretty good, we cannot assure that it will always work on either your or our end, and in the setting of a video visit, we may have to convert it to a phone-only visit.  In either situation, we cannot ensure that we have a secure connection.  Are you willing to proceed?" Yes  4. Advise patient to be prepared - "Two hours prior to your appointment, go  ahead and check your blood pressure, pulse, oxygen saturation, and your weight (if you have the equipment to check those) and write them all down. When your visit starts, your provider will ask you for this information. If you have an Apple Watch or Kardia device, please plan to have heart rate information ready on the day of your appointment. Please have a pen and paper handy nearby the day of the visit as well."  5. Give patient instructions for MyChart download to smartphone OR [email or web address] as below if video visit (depending on what platform provider is using)  6. Inform patient they will receive a phone call 15 minutes prior to their appointment time (may be from unknown caller ID) so they should be prepared to answer    TELEPHONE CALL NOTE  Jon Rodriguez has been deemed a candidate for a follow-up tele-health visit to limit community exposure during the Covid-19 pandemic. I spoke with the patient via phone to ensure availability of phone/video source, confirm preferred email & phone number, and discuss instructions and expectations.  I reminded Jon Rodriguez to be prepared with any vital sign and/or heart rhythm information that could potentially be obtained via home monitoring, at the time of his visit. I reminded Jon Rodriguez to expect a phone call prior to his visit.  Jon Barker, RN 05/04/2019 3:16 PM   INSTRUCTIONS FOR DOWNLOADING THE  MYCHART APP TO SMARTPHONE  - The patient must first make sure to have activated MyChart and know their login information - If Apple, go to [employer] and type in MyChart in the search bar and download the app. If Android, ask patient to go to [employer] and type in [place] in the search bar and download the app. The app is free but as with any other app downloads, their phone may require them to verify saved payment information or Apple/Android password.  - The patient will need to then log into the app with their MyChart username and  password, and select [facility] as their healthcare provider to link the account. When it is time for your visit, go to the MyChart app, find appointments, and click Begin Video Visit. Be sure to Select Allow for your device to access the Microphone and Camera for your visit. You will then be connected, and your provider will be with you shortly.  **If they have any issues connecting, or need assistance please contact MyChart service desk [phone])**  **If using a computer, in order to ensure the best quality for their visit they will need to use either of the following Internet Browsers: [employer], or Google Chrome**  IF USING DOXIMITY or DOXY.ME - The patient will receive a link just prior to their visit by text.     FULL LENGTH CONSENT FOR TELE-HEALTH VISIT   I hereby voluntarily request, consent and authorize [facility] and its employed or contracted physicians, physician assistants, nurse practitioners or other licensed health care professionals (the Practitioner), to provide me with telemedicine health care services (the "Services") as deemed necessary by the treating Practitioner. I acknowledge and consent to receive the Services by the Practitioner via telemedicine. I understand that the telemedicine visit will involve communicating with the Practitioner through live audiovisual communication technology and the disclosure of certain medical information by electronic transmission. I acknowledge that I have been given the opportunity to request an in-person assessment or other available alternative prior to the telemedicine visit and am voluntarily participating in the telemedicine visit.  I understand that I have the right to withhold or withdraw my consent to the use of telemedicine in the course of my care at any time, without affecting my right to future care or treatment, and that the Practitioner or I may terminate the telemedicine visit at any time. I  understand that I have the right to inspect all information obtained and/or recorded in the course of the telemedicine visit and may receive copies of available information for a reasonable fee.  I understand that some of the potential risks of receiving the Services via telemedicine include:  Marland Kitchen Delay or interruption in medical evaluation due to technological equipment failure or disruption; . Information transmitted may not be sufficient (e.g. poor resolution of images) to allow for appropriate medical decision making by the Practitioner; and/or  . In rare instances, security protocols could fail, causing a breach of personal health information.  Furthermore, I acknowledge that it is my responsibility to provide information about my medical history, conditions and care that is complete and accurate to the best of my ability. I acknowledge that Practitioner's advice, recommendations, and/or decision may be based on factors not within their control, such as incomplete or inaccurate data provided by me or distortions of diagnostic images or specimens that may result from electronic transmissions. I understand that the practice of medicine is not an exact science and that Practitioner makes  no warranties or guarantees regarding treatment outcomes. I acknowledge that I will receive a copy of this consent concurrently upon execution via email to the email address I last provided but may also request a printed copy by calling the office of [facility].    I understand that my insurance will be billed for this visit.   I have read or had this consent read to me. . I understand the contents of this consent, which adequately explains the benefits and risks of the Services being provided via telemedicine.  . I have been provided ample opportunity to ask questions regarding this consent and the Services and have had my questions answered to my satisfaction. . I give my informed consent for the services to be  provided through the use of telemedicine in my medical care  By participating in this telemedicine visit I agree to the above.

## 2019-05-06 ENCOUNTER — Telehealth: Payer: Self-pay | Admitting: *Deleted

## 2019-05-06 ENCOUNTER — Encounter: Payer: Self-pay | Admitting: Cardiovascular Disease

## 2019-05-06 ENCOUNTER — Telehealth (INDEPENDENT_AMBULATORY_CARE_PROVIDER_SITE_OTHER): Payer: BC Managed Care – PPO | Admitting: Cardiovascular Disease

## 2019-05-06 VITALS — BP 125/71 | HR 72 | Ht 73.0 in | Wt 238.0 lb

## 2019-05-06 DIAGNOSIS — I10 Essential (primary) hypertension: Secondary | ICD-10-CM

## 2019-05-06 DIAGNOSIS — T82330A Leakage of aortic (bifurcation) graft (replacement), initial encounter: Secondary | ICD-10-CM | POA: Insufficient documentation

## 2019-05-06 DIAGNOSIS — Z6833 Body mass index (BMI) 33.0-33.9, adult: Secondary | ICD-10-CM

## 2019-05-06 DIAGNOSIS — E6609 Other obesity due to excess calories: Secondary | ICD-10-CM

## 2019-05-06 DIAGNOSIS — IMO0001 Reserved for inherently not codable concepts without codable children: Secondary | ICD-10-CM

## 2019-05-06 DIAGNOSIS — I7101 Dissection of thoracic aorta: Secondary | ICD-10-CM

## 2019-05-06 DIAGNOSIS — Z9889 Other specified postprocedural states: Secondary | ICD-10-CM

## 2019-05-06 DIAGNOSIS — I7103 Dissection of thoracoabdominal aorta: Secondary | ICD-10-CM

## 2019-05-06 DIAGNOSIS — E7800 Pure hypercholesterolemia, unspecified: Secondary | ICD-10-CM

## 2019-05-06 DIAGNOSIS — I71019 Dissection of thoracic aorta, unspecified: Secondary | ICD-10-CM

## 2019-05-06 DIAGNOSIS — Z8679 Personal history of other diseases of the circulatory system: Secondary | ICD-10-CM

## 2019-05-06 DIAGNOSIS — Z952 Presence of prosthetic heart valve: Secondary | ICD-10-CM | POA: Insufficient documentation

## 2019-05-06 DIAGNOSIS — IMO0002 Reserved for concepts with insufficient information to code with codable children: Secondary | ICD-10-CM | POA: Insufficient documentation

## 2019-05-06 DIAGNOSIS — T82330D Leakage of aortic (bifurcation) graft (replacement), subsequent encounter: Secondary | ICD-10-CM

## 2019-05-06 MED ORDER — CARVEDILOL 12.5 MG PO TABS: ORAL_TABLET | ORAL | 3 refills | Status: DC

## 2019-05-06 MED ORDER — LOSARTAN POTASSIUM 50 MG PO TABS: 50.0000 mg | ORAL_TABLET | Freq: Two times a day (BID) | ORAL | 3 refills | Status: DC

## 2019-05-06 MED ORDER — SIMVASTATIN 20 MG PO TABS: 20.0000 mg | ORAL_TABLET | Freq: Every day | ORAL | 3 refills | Status: DC

## 2019-05-06 NOTE — Progress Notes (Signed)
Virtual Visit via Video Note   This visit type was conducted due to national recommendations for restrictions regarding the COVID-19 Pandemic (e.g. social distancing) in an effort to limit this patient's exposure and mitigate transmission in our community.  Due to his co-morbid illnesses, this patient is at least at moderate risk for complications without adequate follow up.  This format is felt to be most appropriate for this patient at this time.  All issues noted in this document were discussed and addressed.  A limited physical exam was performed with this format.  Please refer to the patient's chart for his consent to telehealth for [facility].   Date:  05/06/2019   ID:  Jon Rodriguez, DOB [birth year], MRN [id number]  Patient Location: Home Provider Location: Home  PCP:  Lawerance Cruel, MD  Cardiologist:  Sanda Klein, MD  Electrophysiologist:  None   Evaluation Performed:  Follow-Up Visit  Chief Complaint:  F/U Aortic aneurysm repair, HTN  History of Present Illness:    Jon Rodriguez is a 58 y.o. male with type A aortic dissection in 2017 (surgical repair of the ascending aorta with valve resuspension, Dr. 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 Ysidro Evert during single hospitalization.  Despite the extensive surgical procedure he has recovered quite well.  Over the last couple of months he has increased his walking distance from 1/4 mile a day to 2 miles a day.  He feels that he is back to roughly 80% of his presurgical functional ability.  He does not have any chest pain or  shortness of breath and denies leg edema lower body neurological complaints, claudication.  He has not had any palpitations or syncope.  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  For the most part his blood pressure is under excellent control.  It is never high and his average blood pressure is calculated at 123/71.  On a few occasions he has had symptomatic hypotension with blood pressure around 85/45 mmHg.  He has not had syncope, but had to sit down because he was busy.  He is no longer taking hydrochlorothiazide and amlodipine and is on carvedilol and losartan only.  He has lost about 19 pounds of weight.   The patient does not have symptoms concerning for COVID-19 infection (fever, chills, cough, or new shortness of breath).    Past Medical History:  Diagnosis Date  . High cholesterol   . Hypertension    Past Surgical History:  Procedure Laterality Date  . COLONOSCOPY  2012  . REPLACEMENT ASCENDING AORTA N/A 03/31/2016   Procedure: REPLACEMENT OF ASCENDING AORTA USING A 74mm HEMASHIELD PLATINUM VASCULAR GRAFT;  Surgeon: Ivin Poot, MD;  Location: [facility];  Service: Open Heart Surgery;  Laterality: N/A;     Current Meds  Medication Sig  . aspirin 81 MG EC tablet Take 1 tablet (81 mg total) by mouth daily.  . carvedilol (COREG) 25 MG tablet Take 25 mg by mouth 2 (two) times daily with a meal.  . FERROUS SULFATE PO Take 45 mg by mouth 3 (three) times  a week.   Marland Kitchen glucosamine-chondroitin 500-400 MG tablet Take 1 tablet by mouth every morning.   Marland Kitchen losartan (COZAAR) 50 MG tablet Take 50 mg by mouth 2 (two) times daily.  . Multiple Vitamins-Minerals (MULTIVITAMIN WITH MINERALS) tablet Take 1 tablet by mouth daily.  . simvastatin (ZOCOR) 20 MG tablet Take 1 tablet (20 mg total) by mouth daily.  . tamsulosin (FLOMAX) 0.4 MG CAPS capsule TK 1 C PO QHS. TK 30 MINUTES AFTER SAME MEAL  EACH DAY     Allergies:   Patient has no known allergies.   Social History   Tobacco Use  . Smoking status: Former Research scientist (life sciences)  . Smokeless tobacco: Never Used  Substance Use Topics  . Alcohol use: No    Alcohol/week: 0.0 standard drinks  . Drug use: Not on file     Family Hx: The patient's family history includes CAD in his mother; Hyperlipidemia in his father and mother; Hypertension in his father and another family member; Prostate cancer in his father.  ROS:   Please see the history of present illness.    All other systems reviewed and are negative.  Prior CV studies:   The following studies were reviewed today: Notes and imaging studies from Dr. Mali Hughes/[facility].  Labs/Other Tests and Data Reviewed:    EKG:  No ECG reviewed.  Previous ECGs have shown sinus bradycardia, prominent LVH with QRS broadening (160 ms), prominent secondary ST-T changes in the lateral leads and QTc mildly prolonged at 477 ms  Recent Labs: 08/27/2018: ALT 49; BUN 25; Creatinine, Ser 1.04; Potassium 4.4; Sodium 132   Recent Lipid Panel Lab Results  Component Value Date/Time   CHOL 201 (H) 08/27/2018 10:03 AM   TRIG 66 08/27/2018 10:03 AM   HDL 67 08/27/2018 10:03 AM   CHOLHDL 3.0 08/27/2018 10:03 AM   CHOLHDL 3.3 06/11/2016 08:15 AM   LDLCALC 121 (H) 08/27/2018 10:03 AM    Wt Readings from Last 3 Encounters:  05/06/19 238 lb (108 kg)  10/27/18 252 lb (114.3 kg)  07/25/18 249 lb (112.9 kg)     Objective:    Vital Signs:  BP 125/71   Pulse 72   Ht 6\' 1"  (1.854 m)   Wt 238 lb (108 kg)   BMI 31.40 kg/m    VITAL SIGNS:  reviewed GEN:  no acute distress EYES:  sclerae anicteric, EOMI - Extraocular Movements Intact RESPIRATORY:  normal respiratory effort, symmetric expansion CARDIOVASCULAR:  no peripheral edema SKIN:  no rash, lesions or ulcers. MUSCULOSKELETAL:  no obvious deformities. NEURO:  alert and oriented x 3, no obvious focal deficit PSYCH:  normal affect mildly obese.   ASSESSMENT & PLAN:    1. 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  Followed by Dr. Ysidro Evert.  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  Plan for percutaneous Amplatzer occlusion device later this year.  2. AVR (bioprosthesis): Large diameter valve.  At some point will require a baseline echocardiogram to establish the valve signature gradients, but I think it is better to defer this until the coronavirus pandemic is better controlled.  He understands the need for lifelong endocarditis prophylaxis. 3. 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  Global longitudinal LV strain was only mildly decreased at -15.7%. (A few months earlier in November 2019 the left ventricular end-diastolic diameter was estimated at 63 mm). He has no manifestations of heart failure. 4. HTN: Excellent blood pressure control with occasional symptomatic hypotension.  We will decrease the morning dose of carvedilol.  Stay well-hydrated. 5.  Obesity: He is doing an excellent job with increasing physical activity and steadily losing weight.  BMI has decreased from about 33 to about 31.  Encouraged to keep up the good work. 6.  HLP: Plan to repeat a lipid profile later this year.  COVID-19 Education: The signs and symptoms of COVID-19 were discussed with the patient and how to seek care for testing (follow up with PCP or arrange E-visit).  The importance of social distancing was discussed today.  Time:   Today, I have spent 21 minutes with the patient with telehealth technology discussing the above problems.     Medication Adjustments/Labs and Tests Ordered: Current  medicines are reviewed at length with the patient today.  Concerns regarding medicines are outlined above.   Tests Ordered: No orders of the defined types were placed in this encounter.   Medication Changes: No orders of the defined types were placed in this encounter.   Follow Up:  Virtual Visit or In Person 3 months  Signed, Sanda Klein, MD  05/06/2019 8:21 AM    [place]

## 2019-05-06 NOTE — Telephone Encounter (Signed)
The patient has been called and notified of the medication changes from the virtual visit today. He has verbalized his understanding. The AVS will be mailed.

## 2019-05-06 NOTE — Patient Instructions (Addendum)
Medication Instructions:  CHANGE: how you take the Carvedilol to 12.5 mg (one tablet) in the morning and 25 mg (two tablets) in the evening.   If you need a refill on your cardiac medications before your next appointment, please call your pharmacy.   Lab work: None ordered If you have labs (blood work) drawn today and your tests are completely normal, you will receive your results only by: Marland Kitchen MyChart Message (if you have MyChart) OR . A paper copy in the mail If you have any lab test that is abnormal or we need to change your treatment, we will call you to review the results.  Testing/Procedures: None ordered  Follow-Up: At [facility], you and your health needs are our priority.  As part of our continuing mission to provide you with exceptional heart care, we have created designated Provider Care Teams.  These Care Teams include your primary Cardiologist (physician) and Advanced Practice Providers (APPs -  Physician Assistants and Nurse Practitioners) who all work together to provide you with the care you need, when you need it. You will need a follow up appointment in 3 months.  Please call our office 2 months in advance to schedule this appointment.  You may see Sanda Klein, MD or one of the following Advanced Practice Providers on your designated Care Team: [place], [place] . Fabian Sharp, PA-C

## 2019-05-08 ENCOUNTER — Other Ambulatory Visit: Payer: Self-pay

## 2019-05-08 MED ORDER — TAMSULOSIN HCL 0.4 MG PO CAPS: ORAL_CAPSULE | ORAL | 1 refills | Status: DC

## 2019-06-04 DIAGNOSIS — Z13220 Encounter for screening for lipoid disorders: Secondary | ICD-10-CM | POA: Diagnosis not present

## 2019-06-04 DIAGNOSIS — Z23 Encounter for immunization: Secondary | ICD-10-CM | POA: Diagnosis not present

## 2019-06-04 DIAGNOSIS — Z8249 Family history of ischemic heart disease and other diseases of the circulatory system: Secondary | ICD-10-CM | POA: Diagnosis not present

## 2019-06-04 DIAGNOSIS — Z125 Encounter for screening for malignant neoplasm of prostate: Secondary | ICD-10-CM | POA: Diagnosis not present

## 2019-06-04 DIAGNOSIS — E669 Obesity, unspecified: Secondary | ICD-10-CM | POA: Diagnosis not present

## 2019-06-04 DIAGNOSIS — Z0000 Encounter for general adult medical examination without abnormal findings: Secondary | ICD-10-CM | POA: Diagnosis not present

## 2019-06-05 ENCOUNTER — Other Ambulatory Visit: Payer: Self-pay | Admitting: Cardiovascular Disease

## 2019-06-08 DIAGNOSIS — I5032 Chronic diastolic (congestive) heart failure: Secondary | ICD-10-CM | POA: Diagnosis not present

## 2019-06-08 DIAGNOSIS — I119 Hypertensive heart disease without heart failure: Secondary | ICD-10-CM | POA: Diagnosis not present

## 2019-06-08 DIAGNOSIS — Z8619 Personal history of other infectious and parasitic diseases: Secondary | ICD-10-CM | POA: Diagnosis not present

## 2019-06-08 DIAGNOSIS — R9431 Abnormal electrocardiogram [ECG] [EKG]: Secondary | ICD-10-CM | POA: Diagnosis not present

## 2019-06-08 DIAGNOSIS — Z01818 Encounter for other preprocedural examination: Secondary | ICD-10-CM | POA: Diagnosis not present

## 2019-06-08 DIAGNOSIS — Z20828 Contact with and (suspected) exposure to other viral communicable diseases: Secondary | ICD-10-CM | POA: Diagnosis not present

## 2019-06-08 DIAGNOSIS — I440 Atrioventricular block, first degree: Secondary | ICD-10-CM | POA: Diagnosis not present

## 2019-06-09 DIAGNOSIS — D509 Iron deficiency anemia, unspecified: Secondary | ICD-10-CM | POA: Diagnosis not present

## 2019-06-09 DIAGNOSIS — T82330A Leakage of aortic (bifurcation) graft (replacement), initial encounter: Secondary | ICD-10-CM | POA: Diagnosis not present

## 2019-06-09 DIAGNOSIS — I110 Hypertensive heart disease with heart failure: Secondary | ICD-10-CM | POA: Diagnosis not present

## 2019-06-09 DIAGNOSIS — R0689 Other abnormalities of breathing: Secondary | ICD-10-CM | POA: Diagnosis not present

## 2019-06-09 DIAGNOSIS — Z87891 Personal history of nicotine dependence: Secondary | ICD-10-CM | POA: Diagnosis not present

## 2019-06-09 DIAGNOSIS — I440 Atrioventricular block, first degree: Secondary | ICD-10-CM | POA: Diagnosis not present

## 2019-06-09 DIAGNOSIS — Z7982 Long term (current) use of aspirin: Secondary | ICD-10-CM | POA: Diagnosis not present

## 2019-06-09 DIAGNOSIS — Z95828 Presence of other vascular implants and grafts: Secondary | ICD-10-CM | POA: Diagnosis not present

## 2019-06-09 DIAGNOSIS — I5032 Chronic diastolic (congestive) heart failure: Secondary | ICD-10-CM | POA: Diagnosis not present

## 2019-06-09 DIAGNOSIS — Z6721 Type B blood, Rh negative: Secondary | ICD-10-CM | POA: Diagnosis not present

## 2019-06-09 DIAGNOSIS — Q2543 Congenital aneurysm of aorta: Secondary | ICD-10-CM | POA: Diagnosis not present

## 2019-06-09 DIAGNOSIS — Z79899 Other long term (current) drug therapy: Secondary | ICD-10-CM | POA: Diagnosis not present

## 2019-06-09 DIAGNOSIS — Y832 Surgical operation with anastomosis, bypass or graft as the cause of abnormal reaction of the patient, or of later complication, without mention of misadventure at the time of the procedure: Secondary | ICD-10-CM | POA: Diagnosis not present

## 2019-06-09 DIAGNOSIS — I2510 Atherosclerotic heart disease of native coronary artery without angina pectoris: Secondary | ICD-10-CM | POA: Diagnosis not present

## 2019-06-09 DIAGNOSIS — I9789 Other postprocedural complications and disorders of the circulatory system, not elsewhere classified: Secondary | ICD-10-CM | POA: Diagnosis not present

## 2019-06-09 DIAGNOSIS — N400 Enlarged prostate without lower urinary tract symptoms: Secondary | ICD-10-CM | POA: Diagnosis not present

## 2019-06-09 DIAGNOSIS — I712 Thoracic aortic aneurysm, without rupture: Secondary | ICD-10-CM | POA: Diagnosis not present

## 2019-06-19 DIAGNOSIS — Z1283 Encounter for screening for malignant neoplasm of skin: Secondary | ICD-10-CM | POA: Diagnosis not present

## 2019-06-19 DIAGNOSIS — D485 Neoplasm of uncertain behavior of skin: Secondary | ICD-10-CM | POA: Diagnosis not present

## 2019-06-19 DIAGNOSIS — L821 Other seborrheic keratosis: Secondary | ICD-10-CM | POA: Diagnosis not present

## 2019-06-19 DIAGNOSIS — D2271 Melanocytic nevi of right lower limb, including hip: Secondary | ICD-10-CM | POA: Diagnosis not present

## 2019-06-19 DIAGNOSIS — D225 Melanocytic nevi of trunk: Secondary | ICD-10-CM | POA: Diagnosis not present

## 2019-07-01 ENCOUNTER — Other Ambulatory Visit: Payer: Self-pay | Admitting: Cardiovascular Disease

## 2019-07-01 NOTE — Telephone Encounter (Signed)
Pt requesting refill Tamsulosin 0.4 mg tablet qhs. Please advise if ok to refillmedication.

## 2019-07-06 DIAGNOSIS — I7102 Dissection of abdominal aorta: Secondary | ICD-10-CM | POA: Diagnosis not present

## 2019-07-06 DIAGNOSIS — Z951 Presence of aortocoronary bypass graft: Secondary | ICD-10-CM | POA: Diagnosis not present

## 2019-07-06 DIAGNOSIS — Y832 Surgical operation with anastomosis, bypass or graft as the cause of abnormal reaction of the patient, or of later complication, without mention of misadventure at the time of the procedure: Secondary | ICD-10-CM | POA: Diagnosis not present

## 2019-07-06 DIAGNOSIS — Z9889 Other specified postprocedural states: Secondary | ICD-10-CM | POA: Diagnosis not present

## 2019-07-06 DIAGNOSIS — I712 Thoracic aortic aneurysm, without rupture: Secondary | ICD-10-CM | POA: Diagnosis not present

## 2019-07-06 DIAGNOSIS — Z952 Presence of prosthetic heart valve: Secondary | ICD-10-CM | POA: Diagnosis not present

## 2019-07-06 DIAGNOSIS — R918 Other nonspecific abnormal finding of lung field: Secondary | ICD-10-CM | POA: Diagnosis not present

## 2019-07-06 DIAGNOSIS — Z8679 Personal history of other diseases of the circulatory system: Secondary | ICD-10-CM | POA: Diagnosis not present

## 2019-07-06 DIAGNOSIS — T82330A Leakage of aortic (bifurcation) graft (replacement), initial encounter: Secondary | ICD-10-CM | POA: Diagnosis not present

## 2019-07-07 DIAGNOSIS — H35033 Hypertensive retinopathy, bilateral: Secondary | ICD-10-CM | POA: Diagnosis not present

## 2019-08-26 ENCOUNTER — Ambulatory Visit (INDEPENDENT_AMBULATORY_CARE_PROVIDER_SITE_OTHER): Payer: BC Managed Care – PPO | Admitting: Cardiovascular Disease

## 2019-08-26 ENCOUNTER — Telehealth: Payer: Self-pay | Admitting: Radiology

## 2019-08-26 ENCOUNTER — Encounter: Payer: Self-pay | Admitting: Cardiovascular Disease

## 2019-08-26 ENCOUNTER — Other Ambulatory Visit: Payer: Self-pay

## 2019-08-26 VITALS — BP 151/88 | HR 65 | Temp 95.0°F | Ht 73.0 in | Wt 250.0 lb

## 2019-08-26 DIAGNOSIS — R000 Tachycardia, unspecified: Secondary | ICD-10-CM | POA: Diagnosis not present

## 2019-08-26 DIAGNOSIS — E7800 Pure hypercholesterolemia, unspecified: Secondary | ICD-10-CM | POA: Diagnosis not present

## 2019-08-26 DIAGNOSIS — I10 Essential (primary) hypertension: Secondary | ICD-10-CM | POA: Diagnosis not present

## 2019-08-26 DIAGNOSIS — Z9889 Other specified postprocedural states: Secondary | ICD-10-CM

## 2019-08-26 DIAGNOSIS — Z8679 Personal history of other diseases of the circulatory system: Secondary | ICD-10-CM

## 2019-08-26 DIAGNOSIS — E668 Other obesity: Secondary | ICD-10-CM

## 2019-08-26 MED ORDER — CARVEDILOL 25 MG PO TABS: 25.0000 mg | ORAL_TABLET | Freq: Two times a day (BID) | ORAL | 3 refills | Status: DC

## 2019-08-26 NOTE — Patient Instructions (Addendum)
Medication Instructions:  INCREASE the Carvedilol to 25 mg twice daily  *If you need a refill on your cardiac medications before your next appointment, please call your pharmacy*  Lab Work: None ordered If you have labs (blood work) drawn today and your tests are completely normal, you will receive your results only by: Marland Kitchen MyChart Message (if you have MyChart) OR . A paper copy in the mail If you have any lab test that is abnormal or we need to change your treatment, we will call you to review the results.  Testing/Procedures: [employer] Cardiac Event Monitor Instructions Your physician has requested you wear your cardiac event monitor for 14 days. [employer] may call or text to confirm a shipping address. The monitor will be sent to a land address via UPS. [employer] will not ship a monitor to a PO BOX. It typically takes 3-5 days to receive your monitor after it has been enrolled. [employer] will assist with USPS tracking if your package is delayed. The telephone number for [employer] is [phone]. Once you have received your monitor, please review the enclosed instructions. Instruction tutorials can also be viewed under help and settings on the enclosed cell phone. Your monitor has already been registered assigning a specific monitor serial # to you.  Applying the monitor Remove cell phone from case and turn it on. The cell phone works as Dealer and needs to be within [employer] of you at all times. The cell phone will need to be charged on a daily basis. We recommend you plug the cell phone into the enclosed charger at your bedside table every night.  Monitor batteries: You will receive two monitor batteries labelled #1 and #2. These are your recorders. Plug battery #2 onto the second connection on the enclosed charger. Keep one battery on the charger at all times. This will keep the monitor battery deactivated. It will also keep it fully charged for when you need to  switch your monitor batteries. A small light will be blinking on the battery emblem when it is charging. The light on the battery emblem will remain on when the battery is fully charged.  Open package of a Monitor strip. Insert battery #1 into black hood on strip and gently squeeze monitor battery onto connection as indicated in instruction booklet. Set aside while preparing skin.  Choose location for your strip, vertical or horizontal, as indicated in the instruction booklet. Shave to remove all hair from location. There cannot be any lotions, oils, powders, or colognes on skin where monitor is to be applied. Wipe skin clean with enclosed Saline wipe. Dry skin completely.  Peel paper labeled #1 off the back of the Monitor strip exposing the adhesive. Place the monitor on the chest in the vertical or horizontal position shown in the instruction booklet. One arrow on the monitor strip must be pointing upward. Carefully remove paper labeled #2, attaching remainder of strip to your skin. Try not to create any folds or wrinkles in the strip as you apply it.  Firmly press and release the circle in the center of the monitor battery. You will hear a small beep. This is turning the monitor battery on. The heart emblem on the monitor battery will light up every 5 seconds if the monitor battery in turned on and connected to the patient securely. Do not push and hold the circle down as this turns the monitor battery off. The cell phone will locate the monitor battery. A screen will appear on the  cell phone checking the connection of your monitor strip. This may read poor connection initially but change to good connection within the next minute. Once your monitor accepts the connection you will hear a series of 3 beeps followed by a climbing crescendo of beeps. A screen will appear on the cell phone showing the two monitor strip placement options. Touch the picture that demonstrates where you applied the  monitor strip.  Your monitor strip and battery are waterproof. You are able to shower, bathe, or swim with the monitor on. They just ask you do not submerge deeper than 3 feet underwater. We recommend removing the monitor if you are swimming in a lake, river, or ocean.  Your monitor battery will need to be switched to a fully charged monitor battery approximately once a week. The cell phone will alert you of an action which needs to be made.  On the cell phone, tap for details to reveal connection status, monitor battery status, and cell phone battery status. The green dots indicates your monitor is in good status. A red dot indicates there is something that needs your attention.  To record a symptom, click the circle on the monitor battery. In 30-60 seconds a list of symptoms will appear on the cell phone. Select your symptom and tap save. Your monitor will record a sustained or significant arrhythmia regardless of you clicking the button. Some patients do not feel the heart rhythm irregularities. [employer] will notify us of any serious or critical events.  Refer to instruction booklet for instructions on switching batteries, changing strips, the Do not disturb or Pause features, or any additional questions.  Call [employer] at [phone], to confirm your monitor is transmitting and record your baseline. They will answer any questions you may have regarding the monitor instructions at that time.  Returning the monitor to [place] all equipment back into blue box. Peel off strip of paper to expose adhesive and close box securely. There is a prepaid UPS shipping label on this box. Drop in a UPS drop box, or at a UPS facility like [employer]. You may also contact [employer] to arrange UPS to pick up monitor package at your home.    Follow-Up: At [facility], you and your health needs are our priority.  As part of our continuing mission to provide you with exceptional heart  care, we have created designated Provider Care Teams.  These Care Teams include your primary Cardiologist (physician) and Advanced Practice Providers (APPs -  Physician Assistants and Nurse Practitioners) who all work together to provide you with the care you need, when you need it.  Your next appointment:   6 month(s)  The format for your next appointment:   In Person  Provider:   Sanda Klein, MD

## 2019-08-26 NOTE — Progress Notes (Signed)
Cardiology Office Note    Date:  08/28/2019   ID:  Jon Rodriguez, DOB 24-Apr-1961, MRN [id number]  PCP:  Lawerance Cruel, MD  Cardiologist:   Sanda Klein, MD   Chief Complaint  Patient presents with   Follow-up    3 months.   Thoracic Aortic Aneurysm    History of Present Illness:  Jon Rodriguez is a 58 y.o. 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 Prescott Gum, July 2017) - Residual dilation of the aortic root to 5.2 cm with aortic insufficiency - Continued expansion of the aorta distal to the repair, diameter 6.9 cm, new breakdown along the medial aspect of the distal anastomosis with a small contained pseudoaneurysm up to 1.4 cm and surrounding intramural hematoma.  - subsequent two-stage surgery by Dr. Ysidro Evert at [facility] for aortic aneurysm (type II hybrid arch repair).   - 01/01/2019 redo ascending aortic graft  ([facility]) with aortic valve resuspension, coronary reconstruction, transverse aorta placement with graft replacement, aorta to left axillary bypass. 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  Follow-up CT shows resolution of the subclavian endoleak and shows very small type II endoleak via the intercostal arteries.  He has recovered remarkably well from the multiple procedures that he has undergone.  All his surgical wounds have healed well.  His amlodipine and thiazide were stopped since he developed lightheadedness.  At home his  typical blood pressure now is 125-135/63-70, with heart rate usually in the low 60s.  Has been walking on a regular basis and sometimes he sees heart rates as high as 151 bpm (no subjective palpitations).  He is noted that he develops a mild pain beneath his left breast that consistently occurs after 1-1/2 miles of his walking schedule.  It will persist until he finishes walking when he gets home after another 1-1/2 miles.  Over the last couple of months this has steadily improved and he has not had any chest discomfort in the last 3 weeks.  He complains of some fatigue but does not have true dyspnea and has not experienced syncope.  He does not have leg edema. He remains mildly obese with a BMI of 33.   Past Medical History:  Diagnosis Date   High cholesterol    Hypertension     Past Surgical History:  Procedure Laterality Date   COLONOSCOPY  2012   REPLACEMENT ASCENDING AORTA N/A 03/31/2016   Procedure: REPLACEMENT OF ASCENDING AORTA USING A 34mm HEMASHIELD PLATINUM VASCULAR GRAFT;  Surgeon: Ivin Poot, MD;  Location: [place];  Service: Open Heart Surgery;  Laterality: N/A;    Current Medications: Outpatient Medications Prior to Visit  Medication Sig Dispense Refill   aspirin 81 MG EC tablet Take 1 tablet (81 mg total) by mouth daily.     FERROUS SULFATE PO Take 45 mg by mouth 3 (three) times a week.      glucosamine-chondroitin 500-400 MG tablet Take 1 tablet by mouth every morning.  losartan (COZAAR) 50 MG tablet Take 1 tablet (50 mg total) by mouth 2 (two) times daily. 180 tablet 3   Multiple Vitamins-Minerals (MULTIVITAMIN WITH MINERALS) tablet Take 1 tablet by mouth daily.     simvastatin (ZOCOR) 20 MG tablet TAKE 1 TABLET BY MOUTH DAILY 90 tablet 2   tamsulosin (FLOMAX) 0.4 MG CAPS capsule TAKE ONE CAPSULE BY MOUTH EVERY NIGHT AT BEDTIME. TAKE 30 MINUTES AFTER SAME MEAL EACH DAY 60 capsule 5   carvedilol (COREG) 12.5 MG tablet Take 12.5 mg (one tablet) in the  morning and 25 mg (two tablets) in the evening. 270 tablet 3   amLODipine (NORVASC) 10 MG tablet TAKE 1 TABLET BY MOUTH DAILY (Patient not taking: Reported on 05/06/2019) 90 tablet 1   hydrochlorothiazide (HYDRODIURIL) 25 MG tablet Take 1 tablet (25 mg total) by mouth daily. (Patient not taking: Reported on 05/06/2019) 30 tablet 0   No facility-administered medications prior to visit.      Allergies:   Patient has no known allergies.   Social History   Socioeconomic History   Marital status: Divorced    Spouse name: Not on file   Number of children: Not on file   Years of education: Not on file   Highest education level: Not on file  Occupational History   Not on file  Social Needs   Financial resource strain: Not on file   Food insecurity    Worry: Not on file    Inability: Not on file   Transportation needs    Medical: Not on file    Non-medical: Not on file  Tobacco Use   Smoking status: Former Smoker   Smokeless tobacco: Never Used  Substance and Sexual Activity   Alcohol use: No    Alcohol/week: 0.0 standard drinks   Drug use: Not on file   Sexual activity: Not on file  Lifestyle   Physical activity    Days per week: Not on file    Minutes per session: Not on file   Stress: Not on file  Relationships   Social connections    Talks on phone: Not on file    Gets together: Not on file    Attends religious service: Not on file    Active member of club or organization: Not on file    Attends meetings of clubs or organizations: Not on file    Relationship status: Not on file  Other Topics Concern   Not on file  Social History Narrative   Not on file     Family History:  The patient's family history includes CAD in his mother; Hyperlipidemia in his father and mother; Hypertension in his father and another family member; Prostate cancer in his father.   ROS:   Please see the history of present illness.    ROS All other systems are reviewed and  are negative.   PHYSICAL EXAM:   VS:  BP (!) 151/88 (BP Location: Left Arm, Patient Position: Sitting, Cuff Size: Large)    Pulse 65    Temp (!) 95 F (35 C)    Ht 6\' 1"  (1.854 m)    Wt 250 lb (113.4 kg)    BMI 32.98 kg/m      General: Alert, oriented x3, no distress, mildly obese Head: no evidence of trauma, PERRL, EOMI, no exophtalmos or lid lag, no myxedema, no xanthelasma; normal ears, nose and oropharynx Neck: normal jugular venous pulsations and no hepatojugular reflux; brisk carotid pulses without delay and no  carotid bruits Chest: clear to auscultation, no signs of consolidation by percussion or palpation, normal fremitus, symmetrical and full respiratory excursions Cardiovascular: normal position and quality of the apical impulse, regular rhythm, normal first and second heart sounds, no murmurs, rubs or gallops Abdomen: no tenderness or distention, no masses by palpation, no abnormal pulsatility or arterial bruits, normal bowel sounds, no hepatosplenomegaly Extremities: no clubbing, cyanosis or edema; 2+ radial, ulnar and brachial pulses bilaterally; 2+ right femoral, posterior tibial and dorsalis pedis pulses; 2+ left femoral, posterior tibial and dorsalis pedis pulses; no subclavian or femoral bruits Neurological: grossly nonfocal Psych: Normal mood and affect   Wt Readings from Last 3 Encounters:  08/26/19 250 lb (113.4 kg)  05/06/19 238 lb (108 kg)  10/27/18 252 lb (114.3 kg)      Studies/Labs Reviewed:   EKG:  EKG is ordered today.  It shows normal sinus rhythm, incomplete left bundle branch block, possible left ventricular hypertrophy.  It is not much change from the preoperative tracing.  Chest CT angiography 07/06/2019   Impression Intact proximal aortic repair. Widely patent carotid-carotid and transthoracic aorta-left axillary bypass grafts. Well positioned thoracic endografts with no type I or III endoleak. 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 Persistent Type R entry flow with outflow via several intercostal arteries arising from the false lumen.   Recommendation: Re-image in 6 months.   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 Normal LVEDP Normal right heart pressures and PA pressures Normal cardiac output  Left Heart Catheterization Coronary arteries Dominance: right Left main: normal LAD: insignificant LCx: insignificant RCA: RPDA 50% - tubular Hemodynamics (mm Hg) State: Baseline Ao/BP (asc Ao): 125/35 Mean: 68 mmHg LV: 142/0 EDP: 15 mmHg  Right Heart Catheterization State: Baseline RA: 2 mmHg (mean) RV: 31/ 1 mmHg PA: 26/ 9 18 mmHg (mean) PCW: 2 mmHg (mean) AV O2: 4.4 vol% Cardiac output: 6.8 L/min Cardiac index: 2.9 L/min-m2 PVR: 2.3 Wood units   ECHO 11/17/2018  Echo 11/17/18: NORMAL LEFT VENTRICULAR SYSTOLIC FUNCTION (EF > [id number])  NORMAL RIGHT VENTRICULAR SYSTOLIC FUNCTION VALVULAR REGURGITATION: MODERATE AR, TRIVIAL PR NO VALVULAR STENOSIS NO PRIOR STUDY FOR COMPARISON    Recent Labs: No results found for requested labs within last 8760 hours.  Lipid Panel     Component Value Date/Time   CHOL 201 (H) 08/27/2018 1003   TRIG 66 08/27/2018 1003   HDL 67 08/27/2018 1003   CHOLHDL 3.0 08/27/2018 1003   CHOLHDL 3.3 06/11/2016 0815   VLDL 16 06/11/2016 0815   LDLCALC 121 (H) 08/27/2018 1003     ASSESSMENT:    1. Hx of repair of dissecting thoracic aortic aneurysm, Stanford type A   2. S/P aortic valve repair   3. Essential hypertension   4. Tachycardia   5. Hypercholesterolemia   6. Moderate obesity      PLAN:  In order of problems listed above:  1. S/P Asc Ao Dissection/aortic aneurysm repair with redo sternotomy: He has been through a lot this year with multiple surgical procedures but he has recovered remarkably well. 2. AVRepair: I  understand the surgical report correctly, his native aortic valve was resuspended.  I do not hear a murmur of aortic insufficiency anymore.  He no longer has the wide pulse pressure that he had preoperatively.  This suggests he had a very successful procedure.  He will need follow-up  echocardiography (I am not sure if this has been scheduled at [facility]). 3. HTN: His blood pressure is elevated today, but at home he has better blood pressure control.  Recommended increasing the carvedilol to 25 mg twice daily. 4. Tachycardia: His smart watch reports heart rates as fast as 151 bpm, at the limit of what would be expected for sinus tachycardia.  He may be having postoperative atrial arrhythmia.  We will schedule him for an arrhythmia monitor. 5. HLP: Target LDL less than 70. 6. Obesity: I am very impressed by how committed he is to his walking program.  Discussed dietary changes.  He should not do intense isotonic physical exercise.    Medication Adjustments/Labs and Tests Ordered: Current medicines are reviewed at length with the patient today.  Concerns regarding medicines are outlined above.  Medication changes, Labs and Tests ordered today are listed in the Patient Instructions below. Patient Instructions  Medication Instructions:  INCREASE the Carvedilol to 25 mg twice daily  *If you need a refill on your cardiac medications before your next appointment, please call your pharmacy*  Lab Work: None ordered If you have labs (blood work) drawn today and your tests are completely normal, you will receive your results only by:  [place] (if you have MyChart) OR  A paper copy in the mail If you have any lab test that is abnormal or we need to change your treatment, we will call you to review the results.  Testing/Procedures: [employer] Cardiac Event Monitor Instructions Your physician has requested you wear your cardiac event monitor for 14 days. [employer] may call or text to confirm a  shipping address. The monitor will be sent to a land address via UPS. [employer] will not ship a monitor to a PO BOX. It typically takes 3-5 days to receive your monitor after it has been enrolled. [employer] will assist with USPS tracking if your package is delayed. The telephone number for [employer] is [phone]. Once you have received your monitor, please review the enclosed instructions. Instruction tutorials can also be viewed under help and settings on the enclosed cell phone. Your monitor has already been registered assigning a specific monitor serial # to you.  Applying the monitor Remove cell phone from case and turn it on. The cell phone works as Dealer and needs to be within [employer] of you at all times. The cell phone will need to be charged on a daily basis. We recommend you plug the cell phone into the enclosed charger at your bedside table every night.  Monitor batteries: You will receive two monitor batteries labelled #1 and #2. These are your recorders. Plug battery #2 onto the second connection on the enclosed charger. Keep one battery on the charger at all times. This will keep the monitor battery deactivated. It will also keep it fully charged for when you need to switch your monitor batteries. A small light will be blinking on the battery emblem when it is charging. The light on the battery emblem will remain on when the battery is fully charged.  Open package of a Monitor strip. Insert battery #1 into black hood on strip and gently squeeze monitor battery onto connection as indicated in instruction booklet. Set aside while preparing skin.  Choose location for your strip, vertical or horizontal, as indicated in the instruction booklet. Shave to remove all hair from location. There cannot be any lotions, oils, powders, or colognes on skin where monitor is to be applied. Wipe skin clean  with enclosed Saline wipe. Dry skin completely.  Peel paper labeled #1  off the back of the Monitor strip exposing the adhesive. Place the monitor on the chest in the vertical or horizontal position shown in the instruction booklet. One arrow on the monitor strip must be pointing upward. Carefully remove paper labeled #2, attaching remainder of strip to your skin. Try not to create any folds or wrinkles in the strip as you apply it.  Firmly press and release the circle in the center of the monitor battery. You will hear a small beep. This is turning the monitor battery on. The heart emblem on the monitor battery will light up every 5 seconds if the monitor battery in turned on and connected to the patient securely. Do not push and hold the circle down as this turns the monitor battery off. The cell phone will locate the monitor battery. A screen will appear on the cell phone checking the connection of your monitor strip. This may read poor connection initially but change to good connection within the next minute. Once your monitor accepts the connection you will hear a series of 3 beeps followed by a climbing crescendo of beeps. A screen will appear on the cell phone showing the two monitor strip placement options. Touch the picture that demonstrates where you applied the monitor strip.  Your monitor strip and battery are waterproof. You are able to shower, bathe, or swim with the monitor on. They just ask you do not submerge deeper than 3 feet underwater. We recommend removing the monitor if you are swimming in a lake, river, or ocean.  Your monitor battery will need to be switched to a fully charged monitor battery approximately once a week. The cell phone will alert you of an action which needs to be made.  On the cell phone, tap for details to reveal connection status, monitor battery status, and cell phone battery status. The green dots indicates your monitor is in good status. A red dot indicates there is something that needs your attention.  To record a  symptom, click the circle on the monitor battery. In 30-60 seconds a list of symptoms will appear on the cell phone. Select your symptom and tap save. Your monitor will record a sustained or significant arrhythmia regardless of you clicking the button. Some patients do not feel the heart rhythm irregularities. [employer] will notify us of any serious or critical events.  Refer to instruction booklet for instructions on switching batteries, changing strips, the Do not disturb or Pause features, or any additional questions.  Call [employer] at [phone], to confirm your monitor is transmitting and record your baseline. They will answer any questions you may have regarding the monitor instructions at that time.  Returning the monitor to [place] all equipment back into blue box. Peel off strip of paper to expose adhesive and close box securely. There is a prepaid UPS shipping label on this box. Drop in a UPS drop box, or at a UPS facility like [employer]. You may also contact [employer] to arrange UPS to pick up monitor package at your home.    Follow-Up: At [facility], you and your health needs are our priority.  As part of our continuing mission to provide you with exceptional heart care, we have created designated Provider Care Teams.  These Care Teams include your primary Cardiologist (physician) and Advanced Practice Providers (APPs -  Physician Assistants and Nurse Practitioners) who all work together to provide you with the  care you need, when you need it.  Your next appointment:   6 month(s)  The format for your next appointment:   In Person  Provider:   Sanda Klein, MD      Signed, Sanda Klein, MD  08/28/2019 9:49 PM    [address], [place], [place]  [address] Phone: [phone]; Fax: [phone]

## 2019-08-26 NOTE — Telephone Encounter (Signed)
Enrolled patient for a 14 day Zio monitor to be mailed to patients home.  

## 2019-09-02 ENCOUNTER — Ambulatory Visit (INDEPENDENT_AMBULATORY_CARE_PROVIDER_SITE_OTHER): Payer: BC Managed Care – PPO

## 2019-09-02 DIAGNOSIS — R000 Tachycardia, unspecified: Secondary | ICD-10-CM

## 2019-09-23 DIAGNOSIS — R000 Tachycardia, unspecified: Secondary | ICD-10-CM | POA: Diagnosis not present

## 2020-01-01 DIAGNOSIS — T82330A Leakage of aortic (bifurcation) graft (replacement), initial encounter: Secondary | ICD-10-CM | POA: Diagnosis not present

## 2020-01-01 DIAGNOSIS — I708 Atherosclerosis of other arteries: Secondary | ICD-10-CM | POA: Diagnosis not present

## 2020-01-01 DIAGNOSIS — Z8679 Personal history of other diseases of the circulatory system: Secondary | ICD-10-CM | POA: Diagnosis not present

## 2020-01-01 DIAGNOSIS — I722 Aneurysm of renal artery: Secondary | ICD-10-CM | POA: Diagnosis not present

## 2020-01-01 DIAGNOSIS — Z953 Presence of xenogenic heart valve: Secondary | ICD-10-CM | POA: Diagnosis not present

## 2020-01-01 DIAGNOSIS — I712 Thoracic aortic aneurysm, without rupture: Secondary | ICD-10-CM | POA: Diagnosis not present

## 2020-01-01 DIAGNOSIS — Y712 Prosthetic and other implants, materials and accessory cardiovascular devices associated with adverse incidents: Secondary | ICD-10-CM | POA: Diagnosis not present

## 2020-01-01 DIAGNOSIS — I7102 Dissection of abdominal aorta: Secondary | ICD-10-CM | POA: Diagnosis not present

## 2020-01-01 DIAGNOSIS — Z87891 Personal history of nicotine dependence: Secondary | ICD-10-CM | POA: Diagnosis not present

## 2020-01-01 DIAGNOSIS — Z79899 Other long term (current) drug therapy: Secondary | ICD-10-CM | POA: Diagnosis not present

## 2020-01-01 DIAGNOSIS — Z951 Presence of aortocoronary bypass graft: Secondary | ICD-10-CM | POA: Diagnosis not present

## 2020-01-01 DIAGNOSIS — Z9889 Other specified postprocedural states: Secondary | ICD-10-CM | POA: Diagnosis not present

## 2020-01-01 DIAGNOSIS — I7103 Dissection of thoracoabdominal aorta: Secondary | ICD-10-CM | POA: Diagnosis not present

## 2020-01-01 DIAGNOSIS — I517 Cardiomegaly: Secondary | ICD-10-CM | POA: Diagnosis not present

## 2020-01-01 DIAGNOSIS — Z952 Presence of prosthetic heart valve: Secondary | ICD-10-CM | POA: Diagnosis not present

## 2020-02-03 ENCOUNTER — Telehealth: Payer: Self-pay | Admitting: *Deleted

## 2020-02-03 ENCOUNTER — Telehealth (INDEPENDENT_AMBULATORY_CARE_PROVIDER_SITE_OTHER): Payer: BC Managed Care – PPO | Admitting: Cardiovascular Disease

## 2020-02-03 ENCOUNTER — Encounter: Payer: Self-pay | Admitting: Cardiovascular Disease

## 2020-02-03 VITALS — BP 131/75 | HR 66 | Ht 73.0 in | Wt 246.4 lb

## 2020-02-03 DIAGNOSIS — I4729 Other ventricular tachycardia: Secondary | ICD-10-CM

## 2020-02-03 DIAGNOSIS — I441 Atrioventricular block, second degree: Secondary | ICD-10-CM

## 2020-02-03 DIAGNOSIS — E7800 Pure hypercholesterolemia, unspecified: Secondary | ICD-10-CM

## 2020-02-03 DIAGNOSIS — I7101 Dissection of thoracic aorta: Secondary | ICD-10-CM

## 2020-02-03 DIAGNOSIS — Z953 Presence of xenogenic heart valve: Secondary | ICD-10-CM

## 2020-02-03 DIAGNOSIS — I472 Ventricular tachycardia: Secondary | ICD-10-CM

## 2020-02-03 DIAGNOSIS — I471 Supraventricular tachycardia: Secondary | ICD-10-CM

## 2020-02-03 DIAGNOSIS — E669 Obesity, unspecified: Secondary | ICD-10-CM

## 2020-02-03 DIAGNOSIS — D473 Essential (hemorrhagic) thrombocythemia: Secondary | ICD-10-CM

## 2020-02-03 DIAGNOSIS — I71019 Dissection of thoracic aorta, unspecified: Secondary | ICD-10-CM

## 2020-02-03 NOTE — Telephone Encounter (Signed)
  Patient Consent for Virtual Visit         Jon Rodriguez has provided verbal consent on 02/03/2020 for a virtual visit (video or telephone).   CONSENT FOR VIRTUAL VISIT FOR:  Jon Rodriguez  By participating in this virtual visit I agree to the following:  I hereby voluntarily request, consent and authorize [facility] and its employed or contracted physicians, physician assistants, nurse practitioners or other licensed health care professionals (the Practitioner), to provide me with telemedicine health care services (the "Services") as deemed necessary by the treating Practitioner. I acknowledge and consent to receive the Services by the Practitioner via telemedicine. I understand that the telemedicine visit will involve communicating with the Practitioner through live audiovisual communication technology and the disclosure of certain medical information by electronic transmission. I acknowledge that I have been given the opportunity to request an in-person assessment or other available alternative prior to the telemedicine visit and am voluntarily participating in the telemedicine visit.  I understand that I have the right to withhold or withdraw my consent to the use of telemedicine in the course of my care at any time, without affecting my right to future care or treatment, and that the Practitioner or I may terminate the telemedicine visit at any time. I understand that I have the right to inspect all information obtained and/or recorded in the course of the telemedicine visit and may receive copies of available information for a reasonable fee.  I understand that some of the potential risks of receiving the Services via telemedicine include:  Marland Kitchen Delay or interruption in medical evaluation due to technological equipment failure or disruption; . Information transmitted may not be sufficient (e.g. poor resolution of images) to allow for appropriate medical decision making by the Practitioner;  and/or  . In rare instances, security protocols could fail, causing a breach of personal health information.  Furthermore, I acknowledge that it is my responsibility to provide information about my medical history, conditions and care that is complete and accurate to the best of my ability. I acknowledge that Practitioner's advice, recommendations, and/or decision may be based on factors not within their control, such as incomplete or inaccurate data provided by me or distortions of diagnostic images or specimens that may result from electronic transmissions. I understand that the practice of medicine is not an exact science and that Practitioner makes no warranties or guarantees regarding treatment outcomes. I acknowledge that a copy of this consent can be made available to me via my patient portal ([facility] [facility]), or I can request a printed copy by calling the office of [facility].    I understand that my insurance will be billed for this visit.   I have read or had this consent read to me. . I understand the contents of this consent, which adequately explains the benefits and risks of the Services being provided via telemedicine.  . I have been provided ample opportunity to ask questions regarding this consent and the Services and have had my questions answered to my satisfaction. . I give my informed consent for the services to be provided through the use of telemedicine in my medical care

## 2020-02-03 NOTE — Progress Notes (Signed)
Virtual Visit via Video Note   This visit type was conducted due to national recommendations for restrictions regarding the COVID-19 Pandemic (e.g. social distancing) in an effort to limit this patient's exposure and mitigate transmission in our community.  Due to his co-morbid illnesses, this patient is at least at moderate risk for complications without adequate follow up.  This format is felt to be most appropriate for this patient at this time.  All issues noted in this document were discussed and addressed.  A limited physical exam was performed with this format.  Please refer to the patient's chart for his consent to telehealth for [facility].   The patient was identified using 2 identifiers.  Date:  02/04/2020   ID:  Jon Rodriguez, DOB January 21, 1961, MRN [id number]  Patient Location: Home Provider Location: Office  PCP:  Lawerance Cruel, MD  Cardiologist:  Sanda Klein, MD  Electrophysiologist:  None   Evaluation Performed:  Follow-Up Visit  Chief Complaint:  Aortic aneurysm repair  History of Present Illness:    Jon Rodriguez is a 59 y.o. male with acute type a aortic dissection in 2017 with emergency surgical repair (Dr. Prescott Gum), subsequent progression of extensive aneurysm of the ascending aorta, aortic arch arch and descending aorta requiring two-stage surgical repair (Dr. Ysidro Evert at [facility] April 2020), returning for follow-up.  He had a postop follow-up in April 2021 with Dr. Ysidro Evert where he had an echocardiogram and CT angiogram with favorable findings.  The Amplatzer seal for his endoleak is well closed, the false lumen is diminishing in size, the descending aortic TEVAR stent has expanded, he has residual dissection involving the left renal and left common iliac arteries.  The prosthetic aortic valve function is normal (mean gradient 10 mmHg, dimensionless index 0.78).  He has been released to full activity and is back in the gym, avoiding intense and aerobic  straining, performing more aerobic exercise.  He is concerned that he has gained some weight.  His blood pressure has been very well controlled and he has not had problems with dizziness or syncope.  He denies chest pain or shortness of breath at rest or with activity.  He denies palpitations, claudication, focal neurological complaints.  - acute type A ascending aortic dissection with primary intimal tear close to the coronary ostia in 2017, repaired using a 30 mm Hemashield graft to the proximal arch and resuspension of the aortic valve for aortic insufficiency (Dr. Prescott Gum, July 2017) - Residual dilation of the aortic root to 5.2 cm with aortic insufficiency - Continued expansion of the aorta distal to the repair, diameter 6.9 cm, new breakdown along the medial aspect of the distal anastomosis with a small contained pseudoaneurysm up to 1.4 cm and surrounding intramural hematoma.  - subsequent two-stage surgery by Dr. Ysidro Evert at [facility] for aortic aneurysm (type II hybrid arch repair).   - 01/01/2019 redo  supra coronary ascending aortic graft  ([place]) 29 mm Carpentier-Edwards Perimount stented bovine pericardial valve, coronary reconstruction, transverse aorta placement with graft replacement, aorta to left axillary bypass. 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  Follow-up CT shows resolution of the subclavian endoleak and shows very small type  II endoleak via the intercostal arteries.  The patient does not have symptoms concerning for COVID-19 infection (fever, chills, cough, or new shortness of breath).    Past Medical History:  Diagnosis Date  . High  cholesterol   . Hypertension    Past Surgical History:  Procedure Laterality Date  . COLONOSCOPY  2012  . REPLACEMENT ASCENDING AORTA N/A 03/31/2016   Procedure: REPLACEMENT OF ASCENDING AORTA USING A 12mm HEMASHIELD PLATINUM VASCULAR GRAFT;  Surgeon: Ivin Poot, MD;  Location: [facility];  Service: Open Heart Surgery;  Laterality: N/A;     Current Meds  Medication Sig  . aspirin 81 MG EC tablet Take 1 tablet (81 mg total) by mouth daily.  . carvedilol (COREG) 25 MG tablet Take 1 tablet (25 mg total) by mouth 2 (two) times daily with a meal.  . Cholecalciferol (VITAMIN D3) 50 MCG (2000 UT) TABS Take 50 tablets by mouth.  Lyndle Herrlich SULFATE PO Take 45 mg by mouth 3 (three) times a week.   . losartan (COZAAR) 50 MG tablet Take 1 tablet (50 mg total) by mouth 2 (two) times daily.  . Multiple Vitamins-Minerals (MULTIVITAMIN WITH MINERALS) tablet Take 1 tablet by mouth daily.  . simvastatin (ZOCOR) 20 MG tablet TAKE 1 TABLET BY MOUTH DAILY  . tamsulosin (FLOMAX) 0.4 MG CAPS capsule TAKE ONE CAPSULE BY MOUTH EVERY NIGHT AT BEDTIME. TAKE 30 MINUTES AFTER SAME MEAL EACH DAY     Allergies:   Patient has no known allergies.   Social History   Tobacco Use  . Smoking status: Former Research scientist (life sciences)  . Smokeless tobacco: Never Used  Substance Use Topics  . Alcohol use: No    Alcohol/week: 0.0 standard drinks  . Drug use: Not on file     Family Hx: The patient's family history includes CAD in his mother; Hyperlipidemia in his father and mother; Hypertension in his father and another family member; Prostate cancer in his father.  ROS:   Please see the history of present illness.    All other systems reviewed and are negative.  Prior CV studies:   The following studies were reviewed today:  CT angiogram of the aorta January 01, 2020, [facility]: 1. Stable postsurgical change of aortic valve replacement, open ascending aorta repair, and stent graft repair of the aortic arch and descending thoracic aorta. No  pseudoaneurysm.  2. Stable left subclavian Amplatzer occlusion and patent carotid-carotid and brachiocephalic to left subclavian bypass grafts. No endoleak.  3. Increased expansion of the stent within the descending thoracic aorta and resolution of flow in the excluded lumen. Total aortic diameter is unchanged suggesting decreasing size of excluded lumen.  4. Redemonstrated abdominal aortic dissection with extension into the left renal artery and left common iliac.   5. Interval increase in suprarenal aneurysmal dilation measuring up to 4.0 cm from 3.5 cm on prior.   Echocardiogram 01/01/2020, [facility]:   NORMAL LEFT VENTRICULAR FUNCTION WITH MILD LVH   NORMAL RIGHT VENTRICULAR SYSTOLIC FUNCTION   VALVULAR REGURGITATION: TRIVIAL PR, TRIVIAL TR   PROSTHETIC VALVE(S): BIOPROSTHETIC AoV   S/P AVR AND ANEURYSM REPAIR 01/01/2019    Compared with prior Echo study on 11/17/2018: NO SIGNIFICANT CHANGE IN   FUNCTION   S/P AVR; NO AORTIC REGURGITATION TODAY AOV Note: 38mm EDWARDS AORTIC PERIMOUNT 1.9 m/s peak vel   14 mmHg peak grad  10 mmHg mean grad 2.5 cm2 by DOPPLER  LVOT Diam: 2.0 cm. Resting LVOT Vel: 1.4 m/s. Dimensionless Index: 0.78  Labs/Other Tests  and Data Reviewed:    EKG:  No ECG reviewed.  Recent Labs: No results found for requested labs within last 8760 hours.  07/06/2019 Hemoglobin 13.7, creatinine 0.8, potassium 4.9 Recent Lipid Panel Lab Results  Component Value Date/Time   CHOL 201 (H) 08/27/2018 10:03 AM   TRIG 66 08/27/2018 10:03 AM   HDL 67 08/27/2018 10:03 AM   CHOLHDL 3.0 08/27/2018 10:03 AM   CHOLHDL 3.3 06/11/2016 08:15 AM   LDLCALC 121 (H) 08/27/2018 10:03 AM    Wt Readings from Last 3 Encounters:  02/03/20 246 lb 6.4 oz (111.8 kg)  08/26/19 250 lb (113.4 kg)  05/06/19 238 lb (108 kg)     Objective:    Vital Signs:  BP 131/75   Pulse 66   Ht 6\' 1"  (1.854 m)   Wt 246 lb 6.4 oz (111.8 kg)   BMI 32.51 kg/m    VITAL SIGNS:  reviewed Unable to  examine Reports that his typical blood pressure is in the 110s-120s/60s-70s ASSESSMENT & PLAN:    1. Aortic dissection, thoracic ([place])   2. History of aortic valve replacement with porcine valve   3. Essential (hemorrhagic) thrombocythemia ([place])   4. Atrial tachycardia ([place])   5. NSVT (nonsustained ventricular tachycardia) ([facility])   6. Mobitz type 1 second degree atrioventricular block   7. Hypercholesterolemia   8. Mild obesity      1. S/P Asc Ao Dissection/aortic aneurysm repair with redo sternotomy:  Excellent recovery after multiple surgical procedures. 2. AVR (bioprosthetic): Normal prosthetic valve function by echo in April at [facility]. 3. HTN:  Well controlled on carvedilol and losartan. 4. Arrhythmia:   arrhythmia monitor December 2020 showed rare episodes of nonsustained VT and nonsustained atrial tachycardia, but also showed nocturnal second-degree AV block Mobitz type I with moderate bradycardia.  He does not have symptoms of bradycardia and does not have daytime hypersomnolence or other signs or symptoms of obstructive sleep apnea.  Continue the carvedilol. 5. HLP: Target LDL less than 70.  On statin.  Repeat labs with PCP. 6. Obesity:  He is committed to losing the weight that he has gained during the last several months.  Exercising regularly.  Avoiding isometric/anaerobic exercise.   COVID-19 Education: The signs and symptoms of COVID-19 were discussed with the patient and how to seek care for testing (follow up with PCP or arrange E-visit).  The importance of social distancing was discussed today.  Time:   Today, I have spent 23 minutes with the patient with telehealth technology discussing the above problems.     Medication Adjustments/Labs and Tests Ordered: Current medicines are reviewed at length with the patient today.  Concerns regarding medicines are outlined above.  Patient Instructions  Medication Instructions:  No changes *If you need a refill on your cardiac  medications before your next appointment, please call your pharmacy*   Lab Work: None ordered If you have labs (blood work) drawn today and your tests are completely normal, you will receive your results only by: Marland Kitchen MyChart Message (if you have MyChart) OR . A paper copy in the mail If you have any lab test that is abnormal or we need to change your treatment, we will call you to review the results.   Testing/Procedures: None ordered   Follow-Up: At [facility], you and your health needs are our priority.  As part of our continuing mission to provide you with exceptional heart care, we have created designated Provider Care Teams.  These Care Teams include  your primary Cardiologist (physician) and Advanced Practice Providers (APPs -  Physician Assistants and Nurse Practitioners) who all work together to provide you with the care you need, when you need it.  We recommend signing up for the patient portal called "MyChart".  Sign up information is provided on this After Visit Summary.  MyChart is used to connect with patients for Virtual Visits (Telemedicine).  Patients are able to view lab/test results, encounter notes, upcoming appointments, etc.  Non-urgent messages can be sent to your provider as well.   To learn more about what you can do with MyChart, go to [email or web address].    Your next appointment:   6 month(s)  The format for your next appointment:   In Person  Provider:   You may see Sanda Klein, MD or one of the following Advanced Practice Providers on your designated Care Team:    Almyra Deforest, PA-C  Fabian Sharp, PA-C or   Roby Lofts, [place]    Tests Ordered: No orders of the defined types were placed in this encounter.   Medication Changes: No orders of the defined types were placed in this encounter.   Follow Up:  6 months In office  Signed, Sanda Klein, MD  02/04/2020 10:31 AM    [place]

## 2020-02-03 NOTE — Patient Instructions (Signed)

## 2020-02-07 ENCOUNTER — Other Ambulatory Visit: Payer: Self-pay | Admitting: Cardiovascular Disease

## 2020-04-01 ENCOUNTER — Other Ambulatory Visit: Payer: Self-pay | Admitting: Cardiovascular Disease

## 2020-04-04 MED ORDER — LOSARTAN POTASSIUM 50 MG PO TABS: 50.0000 mg | ORAL_TABLET | Freq: Two times a day (BID) | ORAL | 1 refills | Status: DC

## 2020-06-07 DIAGNOSIS — Z13220 Encounter for screening for lipoid disorders: Secondary | ICD-10-CM | POA: Diagnosis not present

## 2020-06-07 DIAGNOSIS — Z125 Encounter for screening for malignant neoplasm of prostate: Secondary | ICD-10-CM | POA: Diagnosis not present

## 2020-06-07 DIAGNOSIS — Z0000 Encounter for general adult medical examination without abnormal findings: Secondary | ICD-10-CM | POA: Diagnosis not present

## 2020-06-07 DIAGNOSIS — Z23 Encounter for immunization: Secondary | ICD-10-CM | POA: Diagnosis not present

## 2020-07-05 DIAGNOSIS — H35033 Hypertensive retinopathy, bilateral: Secondary | ICD-10-CM | POA: Diagnosis not present

## 2020-08-10 ENCOUNTER — Ambulatory Visit (INDEPENDENT_AMBULATORY_CARE_PROVIDER_SITE_OTHER): Payer: BC Managed Care – PPO | Admitting: Cardiovascular Disease

## 2020-08-10 ENCOUNTER — Other Ambulatory Visit: Payer: Self-pay

## 2020-08-10 ENCOUNTER — Encounter: Payer: Self-pay | Admitting: Cardiovascular Disease

## 2020-08-10 VITALS — BP 142/78 | HR 60 | Ht 73.0 in | Wt 244.6 lb

## 2020-08-10 DIAGNOSIS — I7101 Dissection of thoracic aorta: Secondary | ICD-10-CM

## 2020-08-10 DIAGNOSIS — I10 Essential (primary) hypertension: Secondary | ICD-10-CM

## 2020-08-10 DIAGNOSIS — Z953 Presence of xenogenic heart valve: Secondary | ICD-10-CM | POA: Diagnosis not present

## 2020-08-10 DIAGNOSIS — I71019 Dissection of thoracic aorta, unspecified: Secondary | ICD-10-CM

## 2020-08-10 DIAGNOSIS — I441 Atrioventricular block, second degree: Secondary | ICD-10-CM

## 2020-08-10 DIAGNOSIS — E669 Obesity, unspecified: Secondary | ICD-10-CM

## 2020-08-10 DIAGNOSIS — I4729 Other ventricular tachycardia: Secondary | ICD-10-CM

## 2020-08-10 DIAGNOSIS — E7800 Pure hypercholesterolemia, unspecified: Secondary | ICD-10-CM

## 2020-08-10 DIAGNOSIS — I472 Ventricular tachycardia: Secondary | ICD-10-CM

## 2020-08-10 NOTE — Progress Notes (Signed)
Cardiology Office  Note    Date:  08/10/2020   ID:  Jon Rodriguez, DOB [birth year], MRN [id number]  PCP:  Lawerance Cruel, MD  Cardiologist:  Sanda Klein, MD  Electrophysiologist:  None   Evaluation Performed:  Follow-Up Visit  Chief Complaint:  Aortic aneurysm   History of Present Illness:    Jon Rodriguez is a 59 y.o. male with acute type a aortic dissection in 2017 with emergency surgical repair (Dr. 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 Ysidro Evert at [facility] April 2020), returning for follow-up.  He had a postop follow-up in April 2021 with Dr. Ysidro Evert where he had an echocardiogram and CT angiogram with favorable findings.  The Amplatzer seal for his left subclavian endoleak is well closed, the false lumen is diminishing in size, the descending aortic TEVAR stent has expanded, he has residual dissection involving the left renal and left common iliac arteries.  The prosthetic aortic valve function is normal (mean gradient 10 mmHg, dimensionless index 0.78).    He continues to diligently follow up a program of regular exercise.  He is in the gym performing moderate weight lifting 3 days a week, performs cardio exercises other 3 days a week and takes a break on Sundays.  She is carefully following a modified Mediterranean diet, low in animal fats and sugar.  He will indulge in a starchy or sweet snack on Sundays only.  He has been slowly losing weight, but is frustrated that the pace of weight loss is not faster.  He's actually lost 9 pounds in the last 2 months.  He remains mildly obese.  The patient specifically denies any chest pain at rest exertion, dyspnea at rest or with exertion, orthopnea, paroxysmal nocturnal dyspnea, syncope, palpitations, focal neurological deficits, intermittent claudication, lower extremity edema, unexplained weight gain, cough, hemoptysis or  wheezing.  Despite the suboptimal arterial flow to his left upper extremity, he does not have claudication, paresthesias or other ischemic symptoms.  He performs exactly the same type of physical exercises of the right and left upper extremity.  He does feel that strength recovery time is a little longer in that limb.  He monitors his blood pressure carefully on a daily basis.  His average blood pressure over the last month was 129/68 with occasional readings as low as 110/60.  He does feel that he has a little less energy on the current medicines and wonders whether metoprolol was better than carvedilol.  History of aortic aneurysm surgery - acute type A ascending aortic dissection with primary intimal tear close to the coronary ostia in 2017, repaired using a 30 mm Hemashield graft to the proximal arch and resuspension of the aortic valve for aortic insufficiency (Dr. Prescott Gum, July 2017) - Residual dilation of the aortic root to 5.2 cm with aortic insufficiency - Continued expansion of the aorta distal to the repair, diameter 6.9 cm, new breakdown along the medial aspect of the distal anastomosis with a small contained pseudoaneurysm up to 1.4 cm and surrounding intramural hematoma.  - subsequent two-stage surgery by Dr. Ysidro Evert at [facility] for aortic aneurysm (type II hybrid arch repair).   - 01/01/2019 redo  supra coronary ascending aortic graft  ([facility]) 29 mm Carpentier-Edwards Perimount stented bovine pericardial valve, coronary reconstruction, transverse aorta placement with graft replacement, aorta to left axillary bypass. 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  Follow-up CT shows resolution of the subclavian endoleak and shows very small type II endoleak via the intercostal arteries.   Past Medical History:  Diagnosis Date  . High cholesterol   . Hypertension    Past Surgical History:  Procedure Laterality Date  . COLONOSCOPY  2012  . REPLACEMENT ASCENDING AORTA N/A 03/31/2016   Procedure: REPLACEMENT OF ASCENDING AORTA USING A 31mm HEMASHIELD PLATINUM VASCULAR GRAFT;  Surgeon: Ivin Poot, MD;  Location: [place];  Service: Open Heart Surgery;  Laterality: N/A;     Current Meds  Medication Sig  . aspirin 81 MG EC tablet Take 1 tablet (81 mg total) by mouth daily.  . carvedilol (COREG) 25 MG tablet Take 1 tablet (25 mg total) by mouth 2 (two) times daily with a meal.  . Cholecalciferol (VITAMIN D3) 50 MCG (2000 UT) TABS Take 50 tablets by mouth.  . losartan (COZAAR) 50 MG tablet Take 1 tablet (50 mg total) by mouth 2 (two) times daily.  . Multiple Vitamins-Minerals (MULTIVITAMIN WITH MINERALS) tablet Take 1 tablet by mouth daily.  . simvastatin (ZOCOR) 20 MG tablet TAKE 1 TABLET BY MOUTH DAILY  . tamsulosin (FLOMAX) 0.4 MG CAPS capsule TAKE ONE CAPSULE BY MOUTH EVERY NIGHT AT BEDTIME. TAKE 30 MINUTES AFTER SAME MEAL EACH DAY     Allergies:   Patient has no known allergies.   Social History   Tobacco Use  . Smoking status: Former Research scientist (life sciences)  . Smokeless tobacco: Never Used  Substance Use Topics  . Alcohol use: No    Alcohol/week: 0.0 standard drinks  . Drug use: Not on file     Family Hx: The patient's family history includes CAD in his mother; Hyperlipidemia in his father and mother; Hypertension in his father and another family member; Prostate cancer in his father.  ROS:   Please see the history of present illness.    All other systems reviewed and are negative.  Prior CV studies:   The following studies were reviewed today:  CT angiogram of the aorta January 01, 2020, [facility]: 1. Stable postsurgical change of aortic valve  replacement, open ascending aorta repair, and stent graft repair of the aortic arch and descending thoracic aorta. No pseudoaneurysm.  2. Stable left subclavian Amplatzer occlusion and patent carotid-carotid and brachiocephalic to left subclavian bypass grafts. No endoleak.  3. Increased expansion of the stent within the descending thoracic aorta and resolution of flow in the excluded lumen. Total aortic diameter is unchanged suggesting decreasing size of excluded lumen.  4. Redemonstrated abdominal aortic dissection with extension into the left renal artery and left common iliac.   5. Interval increase in suprarenal aneurysmal dilation measuring up to 4.0 cm from 3.5 cm on prior.   Echocardiogram 01/01/2020, [facility]:   NORMAL LEFT VENTRICULAR FUNCTION WITH MILD LVH   NORMAL RIGHT VENTRICULAR SYSTOLIC FUNCTION   VALVULAR REGURGITATION: TRIVIAL PR, TRIVIAL TR   PROSTHETIC VALVE(S): BIOPROSTHETIC AoV   S/P AVR AND ANEURYSM REPAIR 01/01/2019    Compared with prior Echo study on 11/17/2018: NO SIGNIFICANT CHANGE IN   FUNCTION   S/P AVR; NO AORTIC REGURGITATION TODAY AOV Note: 23mm EDWARDS AORTIC PERIMOUNT 1.9 m/s peak vel   14 mmHg peak grad  10 mmHg mean grad 2.5 cm2 by DOPPLER  LVOT Diam: 2.0 cm. Resting LVOT Vel: 1.4 m/s. Dimensionless Index: 0.78  Labs/Other Tests and Data Reviewed:    EKG: Normal sinus rhythm with first-degree AV block (PR 232 ms), mild QRS prolongation (118 ms, incomplete left bundle branch block), ST segment depression in leads V5-V6 with Q waves in leads I and aVL, but with ST and T wave changes less prominent when compared with November 2019.  Prolonged QTC 498 ms.  Recent Labs: No results found for requested labs within last 8760 hours.  07/06/2019 Hemoglobin 13.7, creatinine 0.8, potassium 4.9 Recent Lipid Panel Lab Results  Component Value Date/Time   CHOL 201 (H) 08/27/2018 10:03 AM   TRIG 66 08/27/2018 10:03 AM   HDL 67 08/27/2018 10:03 AM   CHOLHDL  3.0 08/27/2018 10:03 AM   CHOLHDL 3.3 06/11/2016 08:15 AM   LDLCALC 121 (H) 08/27/2018 10:03 AM    Wt Readings from Last 3 Encounters:  08/10/20 244 lb 9.6 oz (110.9 kg)  02/03/20 246 lb 6.4 oz (111.8 kg)  08/26/19 250 lb (113.4 kg)     Objective:    Vital Signs:  BP (!) 142/78 (BP Location: Right Arm)   Pulse 60   Ht 6\' 1"  (1.854 m)   Wt 244 lb 9.6 oz (110.9 kg)   SpO2 97%   BMI 32.27 kg/m     General: Alert, oriented x3, no distress, mildly obese Head: no evidence of trauma, PERRL, EOMI, no exophtalmos or lid lag, no myxedema, no xanthelasma; normal ears, nose and oropharynx Neck: normal jugular venous pulsations and no hepatojugular reflux; brisk carotid pulses without delay and no carotid bruits Chest: clear to auscultation, no signs of consolidation by percussion or palpation, normal fremitus, symmetrical and full respiratory excursions Cardiovascular: normal position and quality of the apical impulse, regular rhythm, normal first and second heart sounds, aortic ejection murmur 2/6, early-peaking, no diastolic murmurs, rubs or gallops Abdomen: no tenderness or distention, no masses by palpation, no abnormal pulsatility or arterial bruits, normal bowel sounds, no hepatosplenomegaly Extremities: no clubbing, cyanosis or edema; 2+ radial, ulnar and brachial pulses on the right side, 1+ Very weak ulnar pulse on the left; 2+ right femoral, posterior tibial and dorsalis pedis pulses; 2+ left femoral, posterior tibial and dorsalis pedis pulses; lower subclavian stenosis bruit heard in the left upper chest radiating all the way into his right shoulder and even heard over the right upper arm Neurological: grossly nonfocal Psych: Normal mood and affect   ASSESSMENT & PLAN:    1. Aortic dissection, thoracic ([place])   2. History of aortic valve replacement with porcine valve   3. Benign essential HTN   4. Mobitz type 1 second degree atrioventricular block   5. NSVT (nonsustained  ventricular tachycardia) (HCC)   6. Hypercholesterolemia   7. Mild obesity      1. S/P Asc Ao Dissection/aortic aneurysm repair with redo sternotomy:  Excellent recovery after multiple surgical procedures. 2. AVR (bioprosthetic): Normal prosthetic valve function by echo in April at [facility]. 3. HTN:  Well controlled on carvedilol and losartan. I think the beta blocker side effects will be the same, maybe worse with a dose of metoprolol that would provide equivalent BP control. Beta blocker critical to his long term aortic health. No change made. 4. Arrhythmia:   arrhythmia monitor December 2020 showed rare episodes of nonsustained VT and nonsustained atrial tachycardia, but also showed nocturnal second-degree AV block Mobitz type I with moderate bradycardia.  He does not have symptoms of bradycardia and does not have daytime hypersomnolence or other signs or symptoms of obstructive sleep apnea.  Continue the carvedilol. 5. HLP: Target LDL ideally less than 70.  On statin.  Last LDL 109. Has some aortic atherosclerosis. 6. Obesity:  He is frustrated how slow he is losing the weight, but he is exercising regularly. Encouraged him to continue with the healthy diet and exercise.   COVID-19 Education: The signs and symptoms of COVID-19 were discussed with the patient and how to seek care for testing (follow up with PCP or arrange E-visit).  The importance of social distancing was discussed today.  Time:   Today, I have spent 23 minutes with the patient with telehealth technology discussing the above problems.     Medication Adjustments/Labs and Tests Ordered: Current medicines are reviewed at length with the patient today.  Concerns regarding medicines are outlined above.  Patient Instructions  Medication Instructions:  No changes *If you need a refill on your cardiac medications before your next appointment, please call your pharmacy*   Lab Work: None ordered If you have labs (blood work)  drawn today and your tests are completely normal, you will receive your results only by: Marland Kitchen MyChart Message (if you have MyChart) OR . A paper copy in the mail If you have any lab test that is abnormal or we need to change your treatment, we will call you to review the results.   Testing/Procedures: None ordered   Follow-Up: At [facility], you and your health needs are our priority.  As part of our continuing mission to provide you with exceptional heart care, we have created designated Provider Care Teams.  These Care Teams include your primary Cardiologist (physician) and Advanced Practice Providers (APPs -  Physician Assistants and Nurse Practitioners) who all work together to provide you with the care you need, when you need it.  We recommend signing up for the patient portal called "MyChart".  Sign up information is provided on this After Visit Summary.  MyChart is used to connect with patients for Virtual Visits (Telemedicine).  Patients are able to view lab/test results, encounter notes, upcoming appointments, etc.  Non-urgent messages can be sent to your provider as well.   To learn more about what you can do with MyChart, go to [email or web address].    Your next appointment:   7 month(s)  The format for your next appointment:   In Person  Provider:   You may see Sanda Klein, MD or one of the following Advanced Practice Providers on your designated Care Team:    Almyra Deforest, PA-C  Fabian Sharp, PA-C or   Roby Lofts, [place]    Tests Ordered: Orders Placed This Encounter  Procedures  . EKG 12-Lead   Patient Instructions  Medication Instructions:  No changes *If you need a refill on your cardiac medications before your next appointment, please call your pharmacy*   Lab Work: None ordered If you have labs (blood work) drawn today and your tests are completely normal, you will receive your results only by: Marland Kitchen MyChart Message (if you have MyChart) OR . A paper  copy in the mail If you have any lab test that is abnormal or we need to change your treatment, we will call you to review the results.   Testing/Procedures: None ordered   Follow-Up: At [facility], you and your health needs are our priority.  As part of our continuing mission to provide you with exceptional  heart care, we have created designated Provider Care Teams.  These Care Teams include your primary Cardiologist (physician) and Advanced Practice Providers (APPs -  Physician Assistants and Nurse Practitioners) who all work together to provide you with the care you need, when you need it.  We recommend signing up for the patient portal called "MyChart".  Sign up information is provided on this After Visit Summary.  MyChart is used to connect with patients for Virtual Visits (Telemedicine).  Patients are able to view lab/test results, encounter notes, upcoming appointments, etc.  Non-urgent messages can be sent to your provider as well.   To learn more about what you can do with MyChart, go to [email or web address].    Your next appointment:   7 month(s)  The format for your next appointment:   In Person  Provider:   You may see Sanda Klein, MD or one of the following Advanced Practice Providers on your designated Care Team:    Almyra Deforest, PA-C  Fabian Sharp, [place] or   Roby Lofts, PA-C     Signed, Sanda Klein, MD  08/10/2020 11:05 AM    [place]

## 2020-08-10 NOTE — Patient Instructions (Signed)
Medication Instructions:  No changes *If you need a refill on your cardiac medications before your next appointment, please call your pharmacy*   Lab Work: None ordered If you have labs (blood work) drawn today and your tests are completely normal, you will receive your results only by:  [place] (if you have MyChart) OR  A paper copy in the mail If you have any lab test that is abnormal or we need to change your treatment, we will call you to review the results.   Testing/Procedures: None ordered   Follow-Up: At [facility], you and your health needs are our priority.  As part of our continuing mission to provide you with exceptional heart care, we have created designated Provider Care Teams.  These Care Teams include your primary Cardiologist (physician) and Advanced Practice Providers (APPs -  Physician Assistants and Nurse Practitioners) who all work together to provide you with the care you need, when you need it.  We recommend signing up for the patient portal called "MyChart".  Sign up information is provided on this After Visit Summary.  MyChart is used to connect with patients for Virtual Visits (Telemedicine).  Patients are able to view lab/test results, encounter notes, upcoming appointments, etc.  Non-urgent messages can be sent to your provider as well.   To learn more about what you can do with MyChart, go to [email or web address].    Your next appointment:   7 month(s)  The format for your next appointment:   In Person  Provider:   You may see Sanda Klein, MD or one of the following Advanced Practice Providers on your designated Care Team:    Almyra Deforest, PA-C  Fabian Sharp, PA-C or   Roby Lofts, [place]

## 2020-09-22 ENCOUNTER — Other Ambulatory Visit: Payer: Self-pay | Admitting: Cardiovascular Disease

## 2020-09-26 ENCOUNTER — Other Ambulatory Visit: Payer: Self-pay

## 2020-09-29 ENCOUNTER — Other Ambulatory Visit: Payer: Self-pay

## 2020-09-29 MED ORDER — LOSARTAN POTASSIUM 50 MG PO TABS: 50.0000 mg | ORAL_TABLET | Freq: Two times a day (BID) | ORAL | 1 refills | Status: DC

## 2020-09-29 MED ORDER — SIMVASTATIN 20 MG PO TABS: 20.0000 mg | ORAL_TABLET | Freq: Every day | ORAL | 2 refills | Status: DC

## 2020-09-29 MED ORDER — CARVEDILOL 25 MG PO TABS: 25.0000 mg | ORAL_TABLET | Freq: Two times a day (BID) | ORAL | 3 refills | Status: DC

## 2020-12-28 ENCOUNTER — Other Ambulatory Visit: Payer: Self-pay | Admitting: *Deleted

## 2020-12-28 MED ORDER — HYDROCHLOROTHIAZIDE 12.5 MG PO CAPS: 12.5000 mg | ORAL_CAPSULE | Freq: Every day | ORAL | 0 refills | Status: DC

## 2020-12-30 DIAGNOSIS — I7102 Dissection of abdominal aorta: Secondary | ICD-10-CM | POA: Diagnosis not present

## 2020-12-30 DIAGNOSIS — Z952 Presence of prosthetic heart valve: Secondary | ICD-10-CM | POA: Diagnosis not present

## 2020-12-30 DIAGNOSIS — Z95828 Presence of other vascular implants and grafts: Secondary | ICD-10-CM | POA: Diagnosis not present

## 2021-01-05 DIAGNOSIS — E669 Obesity, unspecified: Secondary | ICD-10-CM | POA: Diagnosis not present

## 2021-01-17 MED ORDER — HYDROCHLOROTHIAZIDE 12.5 MG PO CAPS: 12.5000 mg | ORAL_CAPSULE | Freq: Every day | ORAL | 3 refills | Status: DC

## 2021-03-13 ENCOUNTER — Other Ambulatory Visit: Payer: Self-pay | Admitting: Cardiovascular Disease

## 2021-03-22 ENCOUNTER — Other Ambulatory Visit: Payer: Self-pay

## 2021-03-22 ENCOUNTER — Encounter: Payer: Self-pay | Admitting: Cardiovascular Disease

## 2021-03-22 ENCOUNTER — Ambulatory Visit (INDEPENDENT_AMBULATORY_CARE_PROVIDER_SITE_OTHER): Payer: 59 | Admitting: Cardiovascular Disease

## 2021-03-22 VITALS — BP 130/81 | HR 70 | Ht 73.0 in | Wt 252.2 lb

## 2021-03-22 DIAGNOSIS — E6609 Other obesity due to excess calories: Secondary | ICD-10-CM | POA: Diagnosis not present

## 2021-03-22 DIAGNOSIS — E7800 Pure hypercholesterolemia, unspecified: Secondary | ICD-10-CM | POA: Diagnosis not present

## 2021-03-22 DIAGNOSIS — Z8679 Personal history of other diseases of the circulatory system: Secondary | ICD-10-CM

## 2021-03-22 DIAGNOSIS — I10 Essential (primary) hypertension: Secondary | ICD-10-CM

## 2021-03-22 DIAGNOSIS — I441 Atrioventricular block, second degree: Secondary | ICD-10-CM | POA: Diagnosis not present

## 2021-03-22 DIAGNOSIS — Z9889 Other specified postprocedural states: Secondary | ICD-10-CM | POA: Diagnosis not present

## 2021-03-22 DIAGNOSIS — Z953 Presence of xenogenic heart valve: Secondary | ICD-10-CM | POA: Diagnosis not present

## 2021-03-22 DIAGNOSIS — Z6833 Body mass index (BMI) 33.0-33.9, adult: Secondary | ICD-10-CM | POA: Diagnosis not present

## 2021-03-22 DIAGNOSIS — E66811 Obesity, class 1: Secondary | ICD-10-CM

## 2021-03-22 NOTE — Progress Notes (Signed)
Cardiology Office  Note    Date:  03/22/2021   ID:  Jon Rodriguez, DOB 1961-06-22, MRN [id number]  PCP:  Lawerance Cruel, MD  Cardiologist:  Sanda Klein, MD  Electrophysiologist:  None   Evaluation Performed:  Follow-Up Visit  Chief Complaint:  Aortic aneurysm   History of Present Illness:    Jon Rodriguez is a 60 y.o. male with acute type a aortic dissection in 2017 with emergency surgical repair (supra coronary ascending aorta and proximal arch replacement, 2017 Dr. 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 Ysidro Evert at [facility] April 2020), returning for follow-up.  He just had a follow-up visit with Dr. 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  He has well-positioned thoracic stent grafts and widely patent carotid-carotid and aorta-left axillary bypass grafts.  The thoracic false lumen has completely thrombosed.  He has known residual dissection involving the left renal artery and the left common iliac artery.  He had a postop follow-up in April 2021 with Dr. Ysidro Evert where he had an echocardiogram and CT angiogram with favorable findings.  The Amplatzer seal for his left subclavian endoleak is well closed, the false lumen is diminishing in size, the descending aortic TEVAR stent has expanded, he has residual dissection involving the left renal and left common iliac arteries.  The prosthetic aortic valve function is normal (mean gradient 10 mmHg, dimensionless index 0.78).    He has had a frustrating time with his attempts to lose weight.  He carefully counts his calories and exercises on a regular basis  (both cardio and weights) and should be in a negative energy balance, but his weight is stuck.  He has been researching nutritional science papers and understands the role of leptin, insulin, etc.  He has tried various diets including Mediterranean diet, keto, Atkins, etc.  He continues to diligently follow up a program of regular exercise.    The patient specifically denies any chest pain at rest exertion, dyspnea at rest or with exertion, orthopnea, paroxysmal nocturnal dyspnea, syncope, palpitations, focal neurological deficits, intermittent claudication, lower extremity edema, unexplained weight gain, cough, hemoptysis or wheezing.  He does not have any problems with claudication in his left upper extremity and is able to perform the same type of l weight lifting with both upper extremities.  His blood pressure is always high at the first reading.  He checks it 3 times in a row and after 10-15 minutes his blood pressure is typically at 130/80 or lower.  His blood pressure is always checked in his right arm.  History of aortic aneurysm surgery - acute type A ascending aortic dissection with primary intimal tear close to the coronary ostia in 2017, repaired using a 30  mm Hemashield graft to the proximal arch and resuspension of the aortic valve for aortic insufficiency (Dr. Prescott Gum, July 2017) - Residual dilation of the aortic root to 5.2 cm with aortic insufficiency - Continued expansion of the aorta distal to the repair, diameter 6.9 cm, new breakdown along the medial aspect of the distal anastomosis with a small contained pseudoaneurysm up to 1.4 cm and surrounding intramural hematoma.  - subsequent two-stage surgery by Dr. Ysidro Evert at  [facility] for aortic aneurysm (type II hybrid arch repair).   - 01/01/2019 redo  supra coronary ascending aortic graft  (Wheat) 29 mm Carpentier-Edwards Perimount stented bovine pericardial valve, coronary reconstruction, transverse aorta placement with graft  replacement, aorta to left axillary bypass. 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  Follow-up CT shows resolution of the subclavian endoleak and shows very small type II endoleak via the intercostal arteries.   Past Medical History:  Diagnosis Date   High cholesterol    Hypertension    Past Surgical History:  Procedure Laterality Date   COLONOSCOPY  2012   REPLACEMENT ASCENDING AORTA N/A 03/31/2016   Procedure: REPLACEMENT OF ASCENDING AORTA USING A 46mm HEMASHIELD PLATINUM VASCULAR GRAFT;  Surgeon: Ivin Poot, MD;  Location: [place];  Service: Open Heart Surgery;  Laterality: N/A;     Current Meds  Medication Sig   aspirin 81 MG EC tablet Take 1 tablet (81 mg total) by mouth daily.   carvedilol (COREG) 25 MG tablet Take 1 tablet (25 mg total) by mouth 2 (two) times daily with a meal.   Cholecalciferol (VITAMIN D3) 50 MCG (2000 UT) TABS Take 50 tablets by mouth.   hydrochlorothiazide (MICROZIDE) 12.5 MG capsule Take 1 capsule (12.5 mg total) by mouth daily.   losartan (COZAAR) 50 MG tablet TAKE 1 TABLET TWICE A DAY   Multiple Vitamins-Minerals (MULTIVITAMIN WITH MINERALS) tablet Take 1 tablet by mouth daily.   simvastatin (ZOCOR) 20 MG tablet Take 1 tablet (20 mg total) by mouth daily.   tamsulosin (FLOMAX) 0.4 MG CAPS capsule TAKE ONE CAPSULE BY MOUTH EVERY NIGHT AT BEDTIME. TAKE 30 MINUTES AFTER SAME MEAL EACH DAY     Allergies:   Patient has no known allergies.   Social History   Tobacco Use   Smoking status: Former    Pack years: 0.00   Smokeless tobacco: Never  Substance Use Topics   Alcohol use: No    Alcohol/week: 0.0 standard drinks      Family Hx: The patient's family history includes CAD in his mother; Hyperlipidemia in his father and mother; Hypertension in his father and another family member; Prostate cancer in his father.  ROS:   Please see the history of present illness.    All other systems reviewed and are negative.  Prior CV studies:   The following studies were reviewed today:  CT angiogram of the aorta January 01, 2020, [facility]: 1. Stable postsurgical change of aortic valve replacement, open ascending aorta repair, and stent graft repair of the aortic arch and descending thoracic aorta. No pseudoaneurysm.  2. Stable left subclavian Amplatzer occlusion and patent carotid-carotid and brachiocephalic to left subclavian bypass grafts. No endoleak.  3. Increased expansion of the stent within the descending thoracic aorta and resolution of flow in the excluded lumen. Total aortic diameter is unchanged suggesting decreasing size of excluded lumen.  4. Redemonstrated abdominal aortic dissection with extension into the left renal artery and left common iliac.   5. Interval increase in suprarenal aneurysmal dilation measuring up to 4.0 cm from 3.5 cm on prior.   Echocardiogram 01/01/2020, [facility]:   NORMAL LEFT VENTRICULAR FUNCTION WITH MILD LVH   NORMAL  RIGHT VENTRICULAR SYSTOLIC FUNCTION   VALVULAR REGURGITATION: TRIVIAL PR, TRIVIAL TR   PROSTHETIC VALVE(S): BIOPROSTHETIC AoV   S/P AVR AND ANEURYSM REPAIR 01/01/2019    Compared with prior Echo study on 11/17/2018: NO SIGNIFICANT CHANGE IN   FUNCTION   S/P AVR; NO AORTIC REGURGITATION TODAY AOV Note: 39mm EDWARDS AORTIC PERIMOUNT 1.9 m/s peak vel   14 mmHg peak grad  10 mmHg mean grad 2.5 cm2 by DOPPLER  LVOT Diam: 2.0 cm. Resting LVOT Vel: 1.4 m/s. Dimensionless Index: 0.78  Labs/Other Tests and Data Reviewed:    EKG:  Ordered today, shows normal sinus rhythm with first-degree AV block at about 260 ms, nonspecific intraventricular conduction delay at about  122 ms (incomplete left bundle branch block), subtle ST segment depression in leads V5 V6, with Q waves in leads I and aVL, mildly prolonged QTC at 503 ms, unchanged from previous tracings.  Recent Labs: No results found for requested labs within last 8760 hours.  07/06/2019 Hemoglobin 13.7, creatinine 0.8, potassium 4.9 Recent Lipid Panel Lab Results  Component Value Date/Time   CHOL 201 (H) 08/27/2018 10:03 AM   TRIG 66 08/27/2018 10:03 AM   HDL 67 08/27/2018 10:03 AM   CHOLHDL 3.0 08/27/2018 10:03 AM   CHOLHDL 3.3 06/11/2016 08:15 AM   LDLCALC 121 (H) 08/27/2018 10:03 AM    Wt Readings from Last 3 Encounters:  03/22/21 252 lb 3.2 oz (114.4 kg)  08/10/20 244 lb 9.6 oz (110.9 kg)  02/03/20 246 lb 6.4 oz (111.8 kg)     Objective:    Vital Signs:  BP 130/81   Pulse 70   Ht 6\' 1"  (1.854 m)   Wt 252 lb 3.2 oz (114.4 kg)   SpO2 96%   BMI 33.27 kg/m      General: Alert, oriented x3, no distress, mildly obese Head: no evidence of trauma, PERRL, EOMI, no exophtalmos or lid lag, no myxedema, no xanthelasma; normal ears, nose and oropharynx Neck: normal jugular venous pulsations and no hepatojugular reflux; brisk carotid pulses without delay and no carotid bruits Chest: clear to auscultation, no signs of consolidation by percussion or palpation, normal fremitus, symmetrical and full respiratory excursions Cardiovascular: normal position and quality of the apical impulse, regular rhythm, normal first and second heart sounds, 2-3/6 early peaking systolic ejection murmur heard best at the left upper sternal border, but also the right upper sternal border radiating towards the carotids, no diastolic murmurs, rubs or gallops Abdomen: no tenderness or distention, no masses by palpation, no abnormal pulsatility or arterial bruits, normal bowel sounds, no hepatosplenomegaly Extremities: no clubbing, cyanosis or edema; 2+ radial, ulnar and brachial pulses bilaterally; 2+ right femoral,  posterior tibial and dorsalis pedis pulses; 2+ left femoral, posterior tibial and dorsalis pedis pulses; no subclavian or femoral bruits Neurological: grossly nonfocal Psych: Normal mood and affect    ASSESSMENT & PLAN:    1. Hx of repair of dissecting thoracic aortic aneurysm, Stanford type A   2. History of aortic valve replacement with porcine valve   3. Essential hypertension   4. Mobitz type 1 second degree atrioventricular block   5. Hypercholesterolemia   6. Class 1 obesity due to excess calories with serious comorbidity and body mass index (BMI) of 33.0 to 33.9 in adult       S/P Asc Ao Dissection/aortic aneurysm repair with redo sternotomy: Excellent recovery.  Good results on his recent work-up with Dr. Ysidro Evert.  Scheduled for next CT angiogram and surgical follow-up in October  2023. AVR (bioprosthetic): Plan to recheck his echocardiogram every 3-5 years.  Reminded him of the need for endocarditis prophylaxis. HTN: Generally well controlled.  On maximum usual dose carvedilol as well as losartan maximum dose and hydrochlorothiazide.  No complaints of fatigue, but would avoid further increases in his beta-blocker dose due to his conduction abnormalities. Arrhythmia:   arrhythmia monitor December 2020 showed rare episodes of nonsustained VT and nonsustained atrial tachycardia, but also showed nocturnal second-degree AV block Mobitz type I with moderate bradycardia.  He does not have sleep apnea. HLP: He does have some evidence of aortic atherosclerosis on imaging studies and ideally his LDL cholesterol should be less than 70.  He is taking a statin and is due for repeat lipid profile with in September with Dr. Harrington Challenger. Obesity:  He is frustrated how slow he is losing the weight, but he is exercising regularly.  Told him that his experience is common and that he will receive the benefits of a healthy lifestyle whether or not his weight actually changes.  Encouraged him to keep on exercising  regularly and to eat a heart healthy diet.  Patient Instructions  Medication Instructions:  No changes *If you need a refill on your cardiac medications before your next appointment, please call your pharmacy*   Lab Work: None ordered If you have labs (blood work) drawn today and your tests are completely normal, you will receive your results only by: [place] (if you have MyChart) OR A paper copy in the mail If you have any lab test that is abnormal or we need to change your treatment, we will call you to review the results.   Testing/Procedures: None ordered   Follow-Up: At [facility], you and your health needs are our priority.  As part of our continuing mission to provide you with exceptional heart care, we have created designated Provider Care Teams.  These Care Teams include your primary Cardiologist (physician) and Advanced Practice Providers (APPs -  Physician Assistants and Nurse Practitioners) who all work together to provide you with the care you need, when you need it.  We recommend signing up for the patient portal called "MyChart".  Sign up information is provided on this After Visit Summary.  MyChart is used to connect with patients for Virtual Visits (Telemedicine).  Patients are able to view lab/test results, encounter notes, upcoming appointments, etc.  Non-urgent messages can be sent to your provider as well.   To learn more about what you can do with MyChart, go to [email or web address].    Your next appointment:   12 month(s)  The format for your next appointment:   In Person  Provider:   Sanda Klein, MD     Signed, Sanda Klein, MD  03/22/2021 8:38 AM    [place]

## 2021-03-22 NOTE — Patient Instructions (Signed)

## 2021-03-23 NOTE — Progress Notes (Signed)
[name] to notify him that I may have unfortunately exposed him to COVID-19 yesterday.  Although I had a negative test yesterday morning and was asymptomatic at the time of our encounter, I did test positive this morning.  I advised him to look out for symptoms of active viral infection such as fever, cough, dyspnea, loss of taste/smell sensation, sore throat, etc. and to test for COVID-19 if the symptoms do occur.  I advised him to call me or Dr. Harrington Challenger if he does test positive so that we can advise him on available treatments.

## 2021-06-09 MED ORDER — HYDROCHLOROTHIAZIDE 25 MG PO TABS: 25.0000 mg | ORAL_TABLET | Freq: Every day | ORAL | 3 refills | Status: DC

## 2021-06-09 MED ORDER — HYDROCHLOROTHIAZIDE 25 MG PO TABS: 25.0000 mg | ORAL_TABLET | Freq: Every day | ORAL | 1 refills | Status: DC

## 2021-06-09 NOTE — Addendum Note (Signed)
Addended by: Ricci Barker on: 06/09/2021 03:40 PM   Modules accepted: Orders

## 2021-06-11 ENCOUNTER — Other Ambulatory Visit: Payer: Self-pay | Admitting: Cardiovascular Disease

## 2021-06-16 DIAGNOSIS — Z0000 Encounter for general adult medical examination without abnormal findings: Secondary | ICD-10-CM | POA: Diagnosis not present

## 2021-06-16 DIAGNOSIS — I10 Essential (primary) hypertension: Secondary | ICD-10-CM | POA: Diagnosis not present

## 2021-06-16 DIAGNOSIS — Z23 Encounter for immunization: Secondary | ICD-10-CM | POA: Diagnosis not present

## 2021-06-16 DIAGNOSIS — E7800 Pure hypercholesterolemia, unspecified: Secondary | ICD-10-CM | POA: Diagnosis not present

## 2021-06-16 DIAGNOSIS — E669 Obesity, unspecified: Secondary | ICD-10-CM | POA: Diagnosis not present

## 2021-06-16 DIAGNOSIS — N400 Enlarged prostate without lower urinary tract symptoms: Secondary | ICD-10-CM | POA: Diagnosis not present

## 2021-06-23 MED ORDER — HYDROCHLOROTHIAZIDE 25 MG PO TABS: 25.0000 mg | ORAL_TABLET | Freq: Every day | ORAL | 3 refills | Status: DC

## 2021-06-25 DIAGNOSIS — M25562 Pain in left knee: Secondary | ICD-10-CM | POA: Diagnosis not present

## 2021-07-11 DIAGNOSIS — H35033 Hypertensive retinopathy, bilateral: Secondary | ICD-10-CM | POA: Diagnosis not present

## 2021-07-13 DIAGNOSIS — M79662 Pain in left lower leg: Secondary | ICD-10-CM | POA: Diagnosis not present

## 2021-07-13 DIAGNOSIS — I10 Essential (primary) hypertension: Secondary | ICD-10-CM | POA: Diagnosis not present

## 2021-07-14 ENCOUNTER — Other Ambulatory Visit (HOSPITAL_COMMUNITY): Payer: Self-pay | Admitting: Family Medicine

## 2021-07-14 ENCOUNTER — Other Ambulatory Visit: Payer: Self-pay

## 2021-07-14 ENCOUNTER — Ambulatory Visit (HOSPITAL_COMMUNITY)
Admission: RE | Admit: 2021-07-14 | Discharge: 2021-07-14 | Disposition: A | Discharge status: Home / Self Care | Payer: 59 | Source: Ambulatory Visit | Attending: Family Medicine | Admitting: Family Medicine

## 2021-07-14 DIAGNOSIS — M79662 Pain in left lower leg: Secondary | ICD-10-CM | POA: Diagnosis not present

## 2021-08-16 DIAGNOSIS — Z23 Encounter for immunization: Secondary | ICD-10-CM | POA: Diagnosis not present

## 2021-08-31 DIAGNOSIS — M25562 Pain in left knee: Secondary | ICD-10-CM | POA: Diagnosis not present

## 2021-08-31 DIAGNOSIS — M1712 Unilateral primary osteoarthritis, left knee: Secondary | ICD-10-CM | POA: Diagnosis not present

## 2021-09-11 ENCOUNTER — Other Ambulatory Visit: Payer: Self-pay | Admitting: Cardiovascular Disease

## 2021-11-29 ENCOUNTER — Encounter: Payer: Self-pay | Admitting: Cardiovascular Disease

## 2022-01-02 ENCOUNTER — Encounter: Payer: Self-pay | Admitting: Cardiovascular Disease

## 2022-01-02 DIAGNOSIS — E7800 Pure hypercholesterolemia, unspecified: Secondary | ICD-10-CM

## 2022-01-02 MED ORDER — PRAVASTATIN SODIUM 40 MG PO TABS: 40.0000 mg | ORAL_TABLET | Freq: Every evening | ORAL | 5 refills | Status: DC

## 2022-01-02 NOTE — Telephone Encounter (Signed)
Please send in Rx for pravastatin 40 mg, every evening, #90, RF3  and a repeat lipid profile in 3 months. ?

## 2022-03-01 ENCOUNTER — Other Ambulatory Visit: Payer: Self-pay | Admitting: Cardiovascular Disease

## 2022-04-03 ENCOUNTER — Encounter: Payer: Self-pay | Admitting: Cardiovascular Disease

## 2022-04-03 ENCOUNTER — Other Ambulatory Visit: Payer: Self-pay | Admitting: Cardiovascular Disease

## 2022-04-03 DIAGNOSIS — I10 Essential (primary) hypertension: Secondary | ICD-10-CM

## 2022-04-03 MED ORDER — OLMESARTAN MEDOXOMIL 40 MG PO TABS: 40.0000 mg | ORAL_TABLET | Freq: Every day | ORAL | 3 refills | Status: DC

## 2022-04-03 NOTE — Telephone Encounter (Signed)
Please increase the losartan to 100 mg daily and continue the same doses of carvedilol and hydrochlorothiazide.

## 2022-04-03 NOTE — Telephone Encounter (Signed)
Sorry about that - my mistake. Please replace losartan with olmesartan 40 mg daily.

## 2022-04-05 DIAGNOSIS — E785 Hyperlipidemia, unspecified: Secondary | ICD-10-CM | POA: Diagnosis not present

## 2022-04-05 DIAGNOSIS — R635 Abnormal weight gain: Secondary | ICD-10-CM | POA: Diagnosis not present

## 2022-04-06 DIAGNOSIS — R635 Abnormal weight gain: Secondary | ICD-10-CM | POA: Diagnosis not present

## 2022-04-06 DIAGNOSIS — E7800 Pure hypercholesterolemia, unspecified: Secondary | ICD-10-CM | POA: Diagnosis not present

## 2022-04-06 LAB — LIPID PANEL
Chol/HDL Ratio: 2.9 ratio (ref 0.0–5.0)
Cholesterol, Total: 205 mg/dL — ABNORMAL HIGH (ref 100–199)
HDL: 71 mg/dL (ref >39)
LDL Chol Calc (NIH): 121 mg/dL — ABNORMAL HIGH (ref 0–99)
Triglycerides: 71 mg/dL (ref 0–149)
VLDL Cholesterol Cal: 13 mg/dL (ref 5–40)

## 2022-04-09 DIAGNOSIS — R635 Abnormal weight gain: Secondary | ICD-10-CM | POA: Diagnosis not present

## 2022-04-13 ENCOUNTER — Encounter: Payer: Self-pay | Admitting: Cardiovascular Disease

## 2022-04-13 MED ORDER — ROSUVASTATIN CALCIUM 20 MG PO TABS: 20.0000 mg | ORAL_TABLET | Freq: Every day | ORAL | 3 refills | Status: DC

## 2022-05-08 DIAGNOSIS — M25562 Pain in left knee: Secondary | ICD-10-CM | POA: Diagnosis not present

## 2022-05-15 DIAGNOSIS — M25562 Pain in left knee: Secondary | ICD-10-CM | POA: Diagnosis not present

## 2022-05-24 DIAGNOSIS — S83232D Complex tear of medial meniscus, current injury, left knee, subsequent encounter: Secondary | ICD-10-CM | POA: Diagnosis not present

## 2022-06-07 DIAGNOSIS — S83232D Complex tear of medial meniscus, current injury, left knee, subsequent encounter: Secondary | ICD-10-CM | POA: Diagnosis not present

## 2022-06-15 ENCOUNTER — Other Ambulatory Visit: Payer: Self-pay | Admitting: Cardiovascular Disease

## 2022-06-21 DIAGNOSIS — Z13220 Encounter for screening for lipoid disorders: Secondary | ICD-10-CM | POA: Diagnosis not present

## 2022-06-21 DIAGNOSIS — Z0000 Encounter for general adult medical examination without abnormal findings: Secondary | ICD-10-CM | POA: Diagnosis not present

## 2022-07-05 NOTE — Progress Notes (Addendum)
Anesthesia Review:  PCP: Lawerance Cruel  Cardiologist : DR Croituri- LOV 03/22/21  Next appt 08/28/22  Has appt on 07/17/22 for preop clearance with Sande Rives.   DR Ysidro Evert- Vascular surgeon at [facility] - has followup with MD day before srugery per pt  And Dr Rosalio Loud and requested clearances.  LVMM for Kerri at [place]  Chest x-ray : Monitor- 2020  EKG : 07/11/22  Echo : 2019  Stress test: Cardiac Cath :  Activity level:  can do a flight of stairs without difficutly  Sleep Study/ CPAP : none  Fasting Blood Sugar :      / Checks Blood Sugar -- times a day:   Blood Thinner/ Instructions /Last Dose: ASA / Instructions/ Last Dose :   Proep appt on 07/11/22 blood pressure was 180/98 in right arm and 179/105 in left arm.  PT denies any chest pain shortness of breath, dizziness, , blurred vision or headache at preop.  PT states he took am meds.  PT reports white coat syndrome.  With blood pressure being elevated at MD appts.  Pt monitors blood pressure at home.   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

## 2022-07-09 NOTE — Patient Instructions (Addendum)
SURGICAL WAITING ROOM VISITATION Patients having surgery or a procedure may have no more than 2 support people in the waiting area - these visitors may rotate.   Children under the age of 33 must have an adult with them who is not the patient. If the patient needs to stay at the hospital during part of their recovery, the visitor guidelines for inpatient rooms apply. Pre-op nurse will coordinate an appropriate time for 1 support person to accompany patient in pre-op.  This support person may not rotate.    Please refer to the [facility] website for the visitor guidelines for Inpatients (after your surgery is over and you are in a regular room).       Your procedure is scheduled on:  07/20/22    Report to [facility] Main Entrance    Report to admitting at   0700AM   Call this number if you have problems the morning of surgery [phone]   Do not eat food :After Midnight.   After Midnight you may have the following liquids until _____ 0700_ AM  DAY OF SURGERY  Water Non-Citrus Juices (without pulp, NO RED) Carbonated Beverages Black Coffee (NO MILK/CREAM OR CREAMERS, sugar ok)  Clear Tea (NO MILK/CREAM OR CREAMERS, sugar ok) regular and decaf                             Plain Jell-O (NO RED)                                           Fruit ices (not with fruit pulp, NO RED)                                     Popsicles (NO RED)                                                               Sports drinks like Gatorade (NO RED)                     The day of surgery:    Drink ONE (1) Pre-Surgery Clear Ensure or G2 at  0600 AM  ( have completed by ) the morning of surgery. Drink in one sitting. Do not sip.  This drink was given to you during your hospital  pre-op appointment visit. Nothing else to drink after completing the  Pre-Surgery Clear Ensure or G2.          If you have questions, please contact your surgeon's office.    Oral Hygiene is also important to  reduce your risk of infection.                                    Remember - BRUSH YOUR TEETH THE MORNING OF SURGERY WITH YOUR REGULAR TOOTHPASTE   Do NOT smoke after Midnight   Take these medicines the morning of surgery with A SIP OF WATER:   coreg, flomax  DO NOT TAKE ANY ORAL DIABETIC MEDICATIONS DAY OF YOUR SURGERY  Bring CPAP mask and tubing day of surgery.                              You may not have any metal on your body including hair pins, jewelry, and body piercing             Do not wear make-up, lotions, powders, perfumes/cologne, or deodorant  Do not wear nail polish including gel and S&S, artificial/acrylic nails, or any other type of covering on natural nails including finger and toenails. If you have artificial nails, gel coating, etc. that needs to be removed by a nail salon please have this removed prior to surgery or surgery may need to be canceled/ delayed if the surgeon/ anesthesia feels like they are unable to be safely monitored.   Do not shave  48 hours prior to surgery.               Men may shave face and neck.   Do not bring valuables to the hospital. [place].   Contacts, dentures or bridgework may not be worn into surgery.   Bring small overnight bag day of surgery.   DO NOT [place]. PHARMACY WILL DISPENSE MEDICATIONS LISTED ON YOUR MEDICATION LIST TO YOU DURING YOUR ADMISSION [place]!    Patients discharged on the day of surgery will not be allowed to drive home.  Someone NEEDS to stay with you for the first 24 hours after anesthesia.   Special Instructions: Bring a copy of your healthcare power of attorney and living will documents the day of surgery if you haven't scanned them before.              Please read over the following fact sheets you were given: IF [place] [phone]   If you received a  COVID test during your pre-op visit  it is requested that you wear a mask when out in public, stay away from anyone that may not be feeling well and notify your surgeon if you develop symptoms. If you test positive for Covid or have been in contact with anyone that has tested positive in the last 10 days please notify you surgeon.     [facility] - Preparing for Surgery Before surgery, you can play an important role.  Because skin is not sterile, your skin needs to be as free of germs as possible.  You can reduce the number of germs on your skin by washing with CHG (chlorahexidine gluconate) soap before surgery.  CHG is an antiseptic cleaner which kills germs and bonds with the skin to continue killing germs even after washing. Please DO NOT use if you have an allergy to CHG or antibacterial soaps.  If your skin becomes reddened/irritated stop using the CHG and inform your nurse when you arrive at Short Stay. Do not shave (including legs and underarms) for at least 48 hours prior to the first CHG shower.  You may shave your face/neck. Please follow these instructions carefully:  1.  Shower with CHG Soap the night before surgery and the  morning of Surgery.  2.  If you choose to wash your hair, wash your hair first as usual  with your  normal  shampoo.  3.  After you shampoo, rinse your hair and body thoroughly to remove the  shampoo.                           4.  Use CHG as you would any other liquid soap.  You can apply chg directly  to the skin and wash                       Gently with a scrungie or clean washcloth.  5.  Apply the CHG Soap to your body ONLY FROM THE NECK DOWN.   Do not use on face/ open                           Wound or open sores. Avoid contact with eyes, ears mouth and genitals (private parts).                       Wash face,  Genitals (private parts) with your normal soap.             6.  Wash thoroughly, paying special attention to the area where your surgery  will be  performed.  7.  Thoroughly rinse your body with warm water from the neck down.  8.  DO NOT shower/wash with your normal soap after using and rinsing off  the CHG Soap.                9.  Pat yourself dry with a clean towel.            10.  Wear clean pajamas.            11.  Place clean sheets on your bed the night of your first shower and do not  sleep with pets. Day of Surgery : Do not apply any lotions/deodorants the morning of surgery.  Please wear clean clothes to the hospital/surgery center.  FAILURE TO FOLLOW THESE INSTRUCTIONS MAY RESULT IN THE CANCELLATION OF YOUR SURGERY PATIENT SIGNATURE_________________________________  NURSE SIGNATURE__________________________________  ________________________________________________________________________

## 2022-07-11 ENCOUNTER — Encounter (HOSPITAL_COMMUNITY): Payer: Self-pay

## 2022-07-11 ENCOUNTER — Telehealth: Payer: Self-pay | Admitting: Cardiovascular Disease

## 2022-07-11 ENCOUNTER — Other Ambulatory Visit: Payer: Self-pay

## 2022-07-11 ENCOUNTER — Encounter (HOSPITAL_COMMUNITY)
Admission: RE | Admit: 2022-07-11 | Discharge: 2022-07-11 | Disposition: A | Discharge status: Home / Self Care | Payer: 59 | Source: Ambulatory Visit | Attending: Orthopedic Surgery | Admitting: Orthopedic Surgery

## 2022-07-11 ENCOUNTER — Telehealth: Payer: Self-pay | Admitting: *Deleted

## 2022-07-11 VITALS — BP 180/98 | HR 58 | Temp 98.5°F | Resp 16 | Ht 73.0 in | Wt 272.0 lb

## 2022-07-11 DIAGNOSIS — N400 Enlarged prostate without lower urinary tract symptoms: Secondary | ICD-10-CM | POA: Diagnosis not present

## 2022-07-11 DIAGNOSIS — Z01818 Encounter for other preprocedural examination: Secondary | ICD-10-CM

## 2022-07-11 DIAGNOSIS — N529 Male erectile dysfunction, unspecified: Secondary | ICD-10-CM | POA: Diagnosis not present

## 2022-07-11 DIAGNOSIS — E7800 Pure hypercholesterolemia, unspecified: Secondary | ICD-10-CM | POA: Diagnosis not present

## 2022-07-11 DIAGNOSIS — Z0000 Encounter for general adult medical examination without abnormal findings: Secondary | ICD-10-CM | POA: Diagnosis not present

## 2022-07-11 DIAGNOSIS — R69 Illness, unspecified: Secondary | ICD-10-CM | POA: Diagnosis not present

## 2022-07-11 DIAGNOSIS — Z87891 Personal history of nicotine dependence: Secondary | ICD-10-CM | POA: Diagnosis not present

## 2022-07-11 DIAGNOSIS — Z6836 Body mass index (BMI) 36.0-36.9, adult: Secondary | ICD-10-CM | POA: Diagnosis not present

## 2022-07-11 LAB — BASIC METABOLIC PANEL
Anion gap: 6 (ref 5–15)
BUN: 16 mg/dL (ref 8–23)
CO2: 26 mmol/L (ref 22–32)
Calcium: 9.5 mg/dL (ref 8.9–10.3)
Chloride: 103 mmol/L (ref 98–111)
Creatinine, Ser: 0.89 mg/dL (ref 0.61–1.24)
GFR, Estimated: >60 mL/min (ref >60)
Glucose, Bld: 115 mg/dL — ABNORMAL HIGH (ref 70–99)
Potassium: 4.7 mmol/L (ref 3.5–5.1)
Sodium: 135 mmol/L (ref 135–145)

## 2022-07-11 LAB — CBC
HCT: 38.9 % — ABNORMAL LOW (ref 39.0–52.0)
Hemoglobin: 13.8 g/dL (ref 13.0–17.0)
MCH: 32.5 pg (ref 26.0–34.0)
MCHC: 35.5 g/dL (ref 30.0–36.0)
MCV: 91.5 fL (ref 80.0–100.0)
Platelets: 205 10*3/uL (ref 150–400)
RBC: 4.25 MIL/uL (ref 4.22–5.81)
RDW: 11.9 % (ref 11.5–15.5)
WBC: 5.7 10*3/uL (ref 4.0–10.5)
nRBC: 0 % (ref 0.0–0.2)

## 2022-07-11 NOTE — Telephone Encounter (Signed)
Follow Up:      Patient is calling to see if you received his clearance? If so, what is the status of it please?

## 2022-07-11 NOTE — Telephone Encounter (Signed)
   Name: Noha Karasik  DOB: 21-Aug-1961  MRN: [id number]  Primary Cardiologist: Sanda Klein, MD  Chart reviewed as part of pre-operative protocol coverage. Because of Quatavious Nicolson's past medical history and time since last visit, he will require a follow-up in-office visit in order to better assess preoperative cardiovascular risk.  Pre-op covering staff: - Please schedule appointment and call patient to inform them. If patient already had an upcoming appointment within acceptable timeframe, please add "pre-op clearance" to the appointment notes so provider is aware. - Please contact requesting surgeon's office via preferred method (i.e, phone, fax) to inform them of need for appointment prior to surgery.  Lenna Sciara, NP  07/11/2022, 4:38 PM

## 2022-07-11 NOTE — Telephone Encounter (Signed)
   Pre-operative Risk Assessment    Patient Name: Jon Rodriguez  DOB: 1961/08/29 MRN: [id number]      Request for Surgical Clearance    Procedure:   LEFT KNEE SCOPE WITH PMM AND MEDIAL TIBIA SUB CHONDROPLASTY   Date of Surgery:  Clearance 07/20/22                                 Surgeon:  DR. Victorino December Surgeon's Group or Practice Name:  [facility] Phone number:  [phone] Fax number:  [phone] ATTN: KER[facility] MAZE   Type of Clearance Requested:   - Medical ; ASA    Type of Anesthesia:   CHOICE   Additional requests/questions:    [name]   07/11/2022, 1:24 PM

## 2022-07-11 NOTE — Telephone Encounter (Signed)
Pt has been scheduled to see Fabian Sharp, [facility] for pre op clearance 07/17/22 @ 1:55. Pt will keep his appt with Dr. Barrie Dunker 08/24/22 as planned. Pt thanked me for the call and the help.

## 2022-07-11 NOTE — Telephone Encounter (Signed)
Returned pt's call and he has been made aware that we have received his surgical clearance and that there is a process that takes place with it.  Once we know what pt may or may not need, someone will call him back and will go from there.  Pt was thankful for the return call.

## 2022-07-12 NOTE — Progress Notes (Deleted)
Cardiology Office Note:    Date:  07/12/2022   ID:  Jon Rodriguez, DOB [birth year], MRN [id number]  PCP:  Lawerance Cruel, MD  Cardiologist:  Sanda Klein, MD  Electrophysiologist:  None   Referring MD: Lawerance Cruel, MD   Chief Complaint: pre-op evaluation  History of Present Illness:    Jon Rodriguez is a 61 y.o. 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 He is followed by Dr. Sallyanne Kuster and presents today for pre-op evaluation for upcoming left knee scope with PPM and medial tibia sub chondroplasty.   Patient presented to the ED in 03/2016 with sudden tearing chest pain and was found to have an acute type A ascending aortic dissection with a primary intimal tear close to the coronary ostia. The tear involved the left subclavian artery and extended down the descending thoracic aorta to the aortic abdominal bifurcation.   He underwent emergent surgical repair using a 107m Hemashield graft to the proximal arch and resuspension of the aortic valve for aortic insufficiency. He had residual dilation of the aortic root to 5.2 cm with aortic insufficiency and continued expansion of the aorta distal to the repair with largest diameter of 6.9 cm. He developed new breakdown along the medial aspect of the distal anastomosis with a small contained pseudoaneursym up to 1.4 cm and surrounding intramural hematoma. Therefore, he [facility]  He underwent redo sternotomy for a AVR with a 29 mm Carpntier Edwards Perimount stented bovine pericardial valve, redo supracoronary ascending aortic replacement, total arch replacement, and aorta to left axillary bypass on 12/31/2018 and then then a left carotid-carotid bypass and TEVAR stent graft repair of the descending thoracic aorta covering the left subclavian artery to the the level of the celiac artery on 01/08/2019. 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 Follow-up CT showed resolution of the subclavian endoleak and only very small type II endoleak via the intercostal arteries. [facility] valve with no evidence of AI. Most recent CTA in 12/2020 showed stable findings.   Patient was last seen by Dr. CSallyanne Kusterin 02/2021 at which time he was doing well from a cardiac standpoint but was having a hard time losing weight.  Patient presents today for pre-op evaluation. He is scheduled to have a left knee scope with PMM and medial tibia sub chondroplasty on 07/20/2022.  ***  Pre-Op Evaluation Patient has upcoming knee surgery planned. He is doing well from a cardiac standpoint with no chest pain, shortness of breath, acute CHF symptoms, palpitations, dizziness, syncope. He is able to complete >4.0 METS without any problems. Therefore, based on ACC/AHA guidelines, patient would be at acceptable risk for the planned procedure without further cardiovascular testing. OK to  hold Aspirin if necessary for 5-7 days prior to procedure but please restart as soon as safely possible afterwards. I will route this recommendation to the requesting party via Epic fax function. ****  History of Type A Aortic Dissection s/p Emergent  Repair in 2017 with Subsequent Expansion of Dissection Requiring Two Staged Surgical Repair in 12/2018 s/p emergent surgical repair (supracoronary ascending aorta and proximal arch replacement and resuspension of the aortic valve for aortic insufficiency) in 2017 with subsequent extensive aneurysm of the ascending aorta, aortic arch, and descending aorta requiring a two stage surgical repair with AVR in 12/2018 at [facility]. 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 Most recent CTA in 12/2020 showed stable findings.  - Doing well. - Continue aspirin,beta-blocker, and statin. - Followed by Dr. Ysidro Evert at [facility]  AVR Underwent AVR with bovine pericardial valve during first stage of surgical repair in 12/2018. Echo in 12/2019 normal LV function with stable prosthetic aortic valve with no residual AI. - Plan is to repeat Echo in about 2-4 years.  Non-Sustained VT Wenckebach Monitor in 08/2019 showed rare episodes of brief NSVT as well as nocturnal episodes of 2nd degree AV block type 1 (Wenckebach). - Asymptomatic.  - Continue Coreg '25mg'$  twice daily.  Hypertension BP *** - Continue current medications: Coreg '25mg'$  twice daily, Olmesartan '40mg'$  daily, and HCTZ '25mg'$  daily.  Hyperlipidemia Lipid panel in 03/2022: Total Cholesterol 205, Triglycerides 71, DL 71, LDL 121.  - Patient was switched from Pravastatin to Crestor '20mg'$  daily at time of last check. Continue. - Will repeat lipid panel and LFTs. ***   Past Medical History:  Diagnosis Date   High cholesterol    Hypertension     Past Surgical History:  Procedure Laterality Date   AORTIC VALVE REPLACEMENT     carotid endartarectomy      COLONOSCOPY  2012   REPLACEMENT ASCENDING AORTA N/A 03/31/2016   Procedure: REPLACEMENT OF ASCENDING AORTA USING A 35m HEMASHIELD PLATINUM VASCULAR GRAFT;  Surgeon: PIvin Poot MD;  [place]  Service: Open [place]  Surgery;  Laterality: N/A;   stent placed in abdomen      subclavian stenosis       Current Medications: No outpatient medications have been marked as taking for the 07/17/22 encounter (Appointment) with GDarreld Mclean PA-C.     Allergies:   Simvastatin   Social History   Socioeconomic History   Marital status: Divorced    Spouse name: Not on file   Number of children: Not on file   Years of education: Not on file   Highest education level: Not on file  Occupational History   Not on file  Tobacco Use   Smoking status: Never   Smokeless tobacco: Former  VScientific laboratory technicianUse: Never used  Substance and Sexual Activity   Alcohol use: Yes    Comment: rare   Drug use: Never   Sexual activity: Not on file  Other Topics Concern   Not on file  Social History Narrative   Not on file   Social Determinants of Health   Financial Resource Strain: Not on file  Food Insecurity: Not on file  Transportation Needs: Not on file  Physical Activity: Not on file  Stress: Not on file  Social Connections: Not on file     Family History: The patient's family history includes CAD in his mother; Hyperlipidemia in his  father and mother; Hypertension in his father and another family member; Prostate cancer in his father.  ROS:   Please see the history of present illness.     EKGs/Labs/Other Studies Reviewed:    The following studies were reviewed:  Monitor 08/2019: Dominant rhythm is normal sinus rhythm with normal circadian variation. There are rare episodes of nonsustained ventricular tachycardia, maximum 8 beats. Rare episodes of nonsustained atrial tachycardia. There are episodes of nocturnal second-degree AV block Mobitz type I, associated with moderate bradycardia.   Abnormal event monitor due to rare episodes of brief nonsustained ventricular tachycardia nonsustained atrial tachycardia, as well as nocturnal bradycardia due to second-degree AV block Mobitz type  I. _______________  Echocardiogram 01/01/2020 ([facility]): Impressions:  NORMAL LEFT VENTRICULAR FUNCTION WITH MILD LVH   NORMAL RIGHT VENTRICULAR SYSTOLIC FUNCTION   VALVULAR REGURGITATION: TRIVIAL PR, TRIVIAL TR   PROSTHETIC VALVE(S): BIOPROSTHETIC AoV   S/P AVR AND ANEURYSM REPAIR 01/01/2019  _______________  CTA 12/30/2020 ([facility]): Impressions: 1.  Status post aortic valve replacement with supracoronary ascending  aortic graft placement and endovascular stent placement within the thoracic  aorta. The proximal descending aorta including the exclude lumen is smaller  in size and measures 5.9 cm, previously 6.3 cm in April 2021.  2.  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  Bypass grafts remain patent.  3.  Unchanged appearance of abdominal aortic dissection when compared to  prior exam. 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   EKG:  EKG ordered today. EKG personally reviewed and demonstrates ***.  Recent Labs: 07/11/2022: BUN 16; Creatinine, Ser 0.89; Hemoglobin 13.8; Platelets 205; Potassium 4.7; Sodium 135  Recent Lipid Panel    Component Value Date/Time   CHOL 205 (H) 04/06/2022 1051   TRIG 71 04/06/2022 1051   HDL 71 04/06/2022 1051   CHOLHDL 2.9 04/06/2022 1051   CHOLHDL 3.3 06/11/2016 0815   VLDL 16 06/11/2016 0815   LDLCALC 121 (H) 04/06/2022 1051    Physical Exam:    Vital Signs: There were no vitals taken for this visit.    Wt Readings from Last 3 Encounters:  07/11/22 272 lb (123.4 kg)  03/22/21 252 lb 3.2 oz (114.4 kg)  08/10/20 244 lb 9.6 oz (110.9 kg)     General: 61 y.o. male in no acute distress. HEENT: Normocephalic and atraumatic. Sclera clear. EOMs intact. Neck: Supple. No carotid bruits. No JVD. Heart: ***  RRR. Distinct S1 and S2. No murmurs, gallops, or rubs. Radial and distal pedal pulses 2+ and equal bilaterally. Lungs: No increased work of breathing. Clear to ausculation bilaterally. No wheezes, rhonchi, or rales.  Abdomen: Soft, non-distended, and non-tender to palpation. Bowel sounds present in all 4 quadrants.  MSK: Normal strength and tone for age. *** Extremities: No lower extremity edema.    Skin: Warm and dry. Neuro: Alert and oriented x3. No focal deficits. Psych: Normal affect. Responds appropriately.   Assessment:    No diagnosis found.  Plan:     Disposition: Follow up in ***   Medication Adjustments/Labs and Tests Ordered: Current medicines are reviewed at length with the patient today.  Concerns regarding medicines are outlined above.  No orders of the defined types were placed in this encounter.  No orders of the defined types were  placed in this encounter.   There are no Patient Instructions on file for this visit.   Signed, Darreld Mclean, PA-C  07/12/2022 11:34 AM    [facility]

## 2022-07-13 DIAGNOSIS — Z953 Presence of xenogenic heart valve: Secondary | ICD-10-CM | POA: Diagnosis not present

## 2022-07-13 DIAGNOSIS — Z48812 Encounter for surgical aftercare following surgery on the circulatory system: Secondary | ICD-10-CM | POA: Diagnosis not present

## 2022-07-13 DIAGNOSIS — Z8679 Personal history of other diseases of the circulatory system: Secondary | ICD-10-CM | POA: Diagnosis not present

## 2022-07-13 DIAGNOSIS — I517 Cardiomegaly: Secondary | ICD-10-CM | POA: Diagnosis not present

## 2022-07-13 DIAGNOSIS — Z01810 Encounter for preprocedural cardiovascular examination: Secondary | ICD-10-CM | POA: Diagnosis not present

## 2022-07-13 DIAGNOSIS — I7103 Dissection of thoracoabdominal aorta: Secondary | ICD-10-CM | POA: Diagnosis not present

## 2022-07-16 ENCOUNTER — Encounter: Payer: Self-pay | Admitting: Student

## 2022-07-16 NOTE — Progress Notes (Signed)
Cardiology Office Note:    Date:  07/17/2022   ID:  Jon Rodriguez, DOB 08/05/1961, MRN [id number]  PCP:  Lawerance Cruel, [facility] Cardiologist:  Sanda Klein, MD     Referring MD: Lawerance Cruel, MD   CC: Here for pre-operative cardiovascular risk assessment   History of Present Illness:    Jon Rodriguez is a 61 y.o. 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  Follow-up aortic angiography in 2022 for type II hybrid arch repair (first stage redo sternotomy for Wheat procedure and total arch replacement followed by second stage TEVAR in April 2020, subsequent Amplatzer endovascular occlusion of type II endoleak from the left subclavian artery in 05/2019).  Imaging revealed he had well-positioned thoracic stent grafts and widely patent carotid-carotid and aorta -left axillary bypass grafts.  Complete thrombosis of thoracic false lumen.  Patient has known residual dissection involving left renal artery and left common iliac artery.    Previous postop follow-up in 2021 with echocardiogram and CT angio with favorable findings, the Amplatzer seal for left subclavian endoleak was well closed, false lumen was diminishing in size, descending aortic TEVAR stent expanded, residual dissection involving left renal and left common iliac arteries.  Prosthetic aortic valve function was normal.  Last seen by Dr. Sallyanne Kuster in June 2022, had reported frustration with  trying to lose weight.  Overall he was doing well from a cardiac perspective and denied any acute cardiac complaints or issues.  Blood pressure is always checked in right arm.  Typically gets optimal readings on the third time, overall well controlled at home.  He recently contacted our office for request for surgical clearance.  Procedure will be left knee scope with PMM and medial tibia sub chondroplasty.  Surgery will be done by Dr. Victorino December of [facility].  Date of surgery will be July 20, 2022.  Requesting for aspirin to be held. Today he presents for preoperative cardiovascular risk assessment.  He states he has been doing well. He stopped taking his Aspirin last Saturday, 07/14/22. Had an appointment with [facility] who performed CT and Echo. No concerning findings and no significant changes. He denies any CP, SHOB, palpitations, syncope, presyncope, dizziness, orthopnea, PND, bleeding, or claudication. BP well controlled at home. Does admit to [facility]. Says he is struggling with his weight. Sees an endocrinologist and he has prediabetes. Very active and works out on treadmill and bike and lifts weights 3 times per week. Denies any other questions or concerns.    Past Medical History:  Diagnosis Date   High cholesterol    Hypertension    S/P AVR    Type A aortic disection    S/p emergent surgical repair in 2017 subsequent extensive aneurysm of the ascending aorta, aortic arch, and descending aorta requiring a two stage surgical repair with AVR in 12/2018 at [facility]    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    Past Surgical History:  Procedure Laterality Date   AORTIC VALVE REPLACEMENT  carotid endartarectomy      COLONOSCOPY  2012   REPLACEMENT ASCENDING AORTA N/A 03/31/2016   Procedure: REPLACEMENT OF ASCENDING AORTA USING A 61m HEMASHIELD PLATINUM VASCULAR GRAFT;  Surgeon: PIvin Poot MD;  [place] [facility] [place]  Service: Open Heart Surgery;  Laterality: N/A;    stent placed in abdomen      subclavian stenosis       Current Medications: Current Meds  Medication Sig   aspirin 81 MG EC tablet Take 1 tablet (81 mg total) by mouth daily.   carvedilol (COREG) 25 MG tablet TAKE 1 TABLET TWICE DAILY  WITH MEALS   Cholecalciferol (VITAMIN D3) 50 MCG (2000 UT) TABS Take 2,000 Units by mouth daily.   hydrochlorothiazide (HYDRODIURIL) 25 MG tablet TAKE 1 TABLET DAILY   Multiple Vitamins-Minerals (MULTIVITAMIN WITH MINERALS) tablet Take 1 tablet by mouth daily.   olmesartan (BENICAR) 40 MG tablet Take 1 tablet (40 mg total) by mouth daily.   Omega-3 1000 MG CAPS Take 1,000 mg by mouth daily.   rosuvastatin (CRESTOR) 20 MG tablet Take 1 tablet (20 mg total) by mouth daily.   tamsulosin (FLOMAX) 0.4 MG CAPS capsule Take 0.4 mg by mouth daily.     Allergies:   Simvastatin   Social History   Socioeconomic History   Marital status: Divorced    Spouse name: Not on file   Number of children: Not on file   Years of education: Not on file   Highest education level: Not on file  Occupational History   Not on file  Tobacco Use   Smoking status: Never   Smokeless tobacco: Former  VScientific laboratory technicianUse: Never used  Substance and Sexual Activity   Alcohol use: Yes    Comment: rare   Drug use: Never   Sexual activity: Not on file  Other Topics Concern   Not on file  Social History Narrative   Not on file   Social Determinants of Health   Financial Resource Strain: Not on file  Food Insecurity: Not on file  Transportation Needs: Not on file  Physical Activity: Not on file  Stress: Not on file  Social Connections: Not on file     Family History: The patient's family history includes CAD in his mother; Hyperlipidemia in his father and mother; Hypertension in his father and another family member; Prostate cancer in his father.  ROS:   Review of Systems  Constitutional: Negative.   HENT: Negative.    Eyes: Negative.   Respiratory: Negative.     Cardiovascular: Negative.   Gastrointestinal: Negative.   Genitourinary: Negative.   Musculoskeletal:  Positive for joint pain. Negative for back pain, falls, myalgias and neck pain.  Skin: Negative.   Neurological:  Positive for tremors. Negative for dizziness, tingling, sensory change, speech change, focal weakness, seizures, loss of consciousness, weakness and headaches.       Chronic left sided face twitching.   Psychiatric/Behavioral: Negative.       Please see the history of present illness.    All other systems reviewed and are negative.  EKGs/Labs/Other Studies Reviewed:    The following studies were reviewed today:   EKG:  EKG is ordered today.  The ekg ordered today demonstrates sinus rhythm with first-degree block, 64 bpm, with blocked PACs, nonspecific intraventricular block, T wave abnormality, otherwise no acute ischemic change since last EKG.  Recent Labs: 07/11/2022: BUN 16; Creatinine, Ser 0.89; Hemoglobin 13.8; Platelets 205; Potassium 4.7; Sodium 135  Recent  Lipid Panel    Component Value Date/Time   CHOL 205 (H) 04/06/2022 1051   TRIG 71 04/06/2022 1051   HDL 71 04/06/2022 1051   CHOLHDL 2.9 04/06/2022 1051   CHOLHDL 3.3 06/11/2016 0815   VLDL 16 06/11/2016 0815   LDLCALC 121 (H) 04/06/2022 1051     Risk Assessment/Calculations:     HYPERTENSION CONTROL Vitals:   07/17/22 1359 07/17/22 1400  BP: (!) 142/98 (!) 140/92    The patient's blood pressure is elevated above target today. In order to address the patient's elevated BP: The blood pressure is usually elevated in clinic.  Blood pressures monitored at home have been optimal.; Blood pressure will be monitored at home to determine if medication changes need to be made.      Physical Exam:    VS:  BP (!) 140/92 (BP Location: Right Arm, Patient Position: Sitting, Cuff Size: Large)   Pulse 69   Ht 6' (1.829 m)   Wt 276 lb 6.4 oz (125.4 kg)   SpO2 98%   BMI 37.49 kg/m     Wt Readings  from Last 3 Encounters:  07/17/22 276 lb 6.4 oz (125.4 kg)  07/11/22 272 lb (123.4 kg)  03/22/21 252 lb 3.2 oz (114.4 kg)     GEN: Obese, 61 y.o. Caucasian male in NAD  HEENT: Left sided twitching of eye and face, otherwise normal  NECK: No JVD; No carotid bruits CARDIAC: S1/S2, RRR, no murmurs, rubs, gallops; 2+ peripheral pulses throughout, strong and equal bilaterally RESPIRATORY:  Clear and diminised to auscultation without rales, wheezing or rhonchi  ABDOMEN: Soft, non-tender, non-distended, bowel sounds x 4 MUSCULOSKELETAL:  No edema; No deformity  SKIN: Warm and dry NEUROLOGIC:  Alert and oriented x 3 PSYCHIATRIC:  Normal affect   ASSESSMENT:    1. Preop cardiovascular exam   2. Hx of repair of dissecting thoracic aortic aneurysm, Stanford type A   3. History of aortic valve replacement with porcine valve   4. Hypertension, unspecified type   5. Cardiac arrhythmia, unspecified cardiac arrhythmia type   6. Hyperlipidemia, unspecified hyperlipidemia type   7. Class 2 obesity with body mass index (BMI) of 37.0 to 37.9 in adult, unspecified obesity type, unspecified whether serious comorbidity present    PLAN:    In order of problems listed above:  Pre-operative cardiovascular risk assessment    Mr. Housey perioperative risk of a major cardiac event is 0.4% according to the Revised Cardiac Risk Index (RCRI).  Therefore, he is at low risk for perioperative complications.   His functional capacity is excellent at 9.89 METs according to the [facility] Activity Status Index (DASI). Recommendations: According to ACC/AHA guidelines, no further cardiovascular testing needed.  The patient may proceed to surgery at acceptable risk.   Antiplatelet and/or Anticoagulation Recommendations: The patient should remain on Aspirin without interruption unless surgeon feels bleeding risk is too high.  Patient is not on any anticoagulation medications. Patient does require SBE prophylaxis for history  of AVR prior to any future dental procedure. Discussed this with patient and he verbalizes understanding.   2. History of Ascending aortic dissection/aortic aneurysm repair with redo sternotomy No new or worsening symptoms. Recent f/u appt with [facility] last week. No changes on most recent Echo. Continue to follow up with [facility]. Continue current medication regimen. Heart healthy diet and regular cardiovascular exercise as tolerated encouraged.   3. S/P AVR No changes on most recent Echo last week at [facility]. Continue follow up with [facility].  4. HTN BP on arrival, 142/98. Repeat BP by me, 140/92. Does admit to [facility]. BP overall well controlled at home. BP log given today and discussed to monitor and log BP at home at least 2 hours after medications and sitting for 5-10 minutes. No medication changes at this time. Heart healthy diet and regular cardiovascular exercise as tolerated encouraged.   5. Arrhythmia Monitor in 2020 revealed rare, brief NSVT, NSAT, and second degree AV Mobitz type 1. EKG today shows SR, with first degree AV block with blocked PAC's, nonspecific intraventricular block, with nonspecific ST segment changes and T wave abnormality, as seen on previous EKG's. Denies any tachycardia or palpitations. Continue Coreg.   6. Hyperlipidemia  Last lipid profile 03/2022 revealed LDL was 121, and total cholesterol 205. Was started on Crestor 20 mg daily by Dr. Sallyanne Kuster. Tolerating well. Just had labs drawn with his PCP and he will fax [place] over results.   7. Class 2 obesity BMI today 37.49. Continue to follow up with PCP and endocrinology. Weight loss via diet and exercise encouraged. Discussed the impact being overweight would have on cardiovascular risk.  8. Disposition: Follow up with Dr. Sallyanne Kuster in December 2023 or sooner if anything changes.    Medication Adjustments/Labs and Tests Ordered: Current medicines are reviewed at length with the patient today.  Concerns regarding medicines are  outlined above.  Orders Placed This Encounter  Procedures   EKG 12-Lead   No orders of the defined types were placed in this encounter.   Patient Instructions  Medication Instructions:  Cleared for surgery- may restart aspirin by surgeon recommendations.  *If you need a refill on your cardiac medications before your next appointment, please call your pharmacy*  Follow-Up: At [facility], you and your health needs are our priority.  As part of our continuing mission to provide you with exceptional heart care, we have created designated Provider Care Teams.  These Care Teams include your primary Cardiologist (physician) and Advanced Practice Providers (APPs -  Physician Assistants and Nurse Practitioners) who all work together to provide you with the care you need, when you need it.  We recommend signing up for the patient portal called "MyChart".  Sign up information is provided on this After Visit Summary.  MyChart is used to connect with patients for Virtual Visits (Telemedicine).  Patients are able to view lab/test results, encounter notes, upcoming appointments, etc.  Non-urgent messages can be sent to your provider as well.   To learn more about what you can do with MyChart, go to [email or web address].    Your next appointment:   Keep scheduled with Dr.Croitoru         Signed, Finis Bud, NP  07/17/2022 4:55 PM    [place]

## 2022-07-17 ENCOUNTER — Ambulatory Visit: Payer: 59 | Attending: Student | Admitting: Nurse Practitioner

## 2022-07-17 ENCOUNTER — Encounter: Payer: Self-pay | Admitting: Nurse Practitioner

## 2022-07-17 VITALS — BP 140/92 | HR 69 | Ht 72.0 in | Wt 276.4 lb

## 2022-07-17 DIAGNOSIS — Z9889 Other specified postprocedural states: Secondary | ICD-10-CM

## 2022-07-17 DIAGNOSIS — H35033 Hypertensive retinopathy, bilateral: Secondary | ICD-10-CM | POA: Diagnosis not present

## 2022-07-17 DIAGNOSIS — Z6837 Body mass index (BMI) 37.0-37.9, adult: Secondary | ICD-10-CM

## 2022-07-17 DIAGNOSIS — E669 Obesity, unspecified: Secondary | ICD-10-CM

## 2022-07-17 DIAGNOSIS — I499 Cardiac arrhythmia, unspecified: Secondary | ICD-10-CM

## 2022-07-17 DIAGNOSIS — Z953 Presence of xenogenic heart valve: Secondary | ICD-10-CM

## 2022-07-17 DIAGNOSIS — I10 Essential (primary) hypertension: Secondary | ICD-10-CM

## 2022-07-17 DIAGNOSIS — E785 Hyperlipidemia, unspecified: Secondary | ICD-10-CM

## 2022-07-17 DIAGNOSIS — Z01810 Encounter for preprocedural cardiovascular examination: Secondary | ICD-10-CM | POA: Diagnosis not present

## 2022-07-17 DIAGNOSIS — Z8679 Personal history of other diseases of the circulatory system: Secondary | ICD-10-CM

## 2022-07-17 NOTE — Patient Instructions (Signed)
Medication Instructions:  Cleared for surgery- may restart aspirin by surgeon recommendations.  *If you need a refill on your cardiac medications before your next appointment, please call your pharmacy*  Follow-Up: At [facility], you and your health needs are our priority.  As part of our continuing mission to provide you with exceptional heart care, we have created designated Provider Care Teams.  These Care Teams include your primary Cardiologist (physician) and Advanced Practice Providers (APPs -  Physician Assistants and Nurse Practitioners) who all work together to provide you with the care you need, when you need it.  We recommend signing up for the patient portal called "MyChart".  Sign up information is provided on this After Visit Summary.  MyChart is used to connect with patients for Virtual Visits (Telemedicine).  Patients are able to view lab/test results, encounter notes, upcoming appointments, etc.  Non-urgent messages can be sent to your provider as well.   To learn more about what you can do with MyChart, go to [email or web address].    Your next appointment:   Keep scheduled with Dr.Croitoru

## 2022-07-18 ENCOUNTER — Other Ambulatory Visit: Payer: Self-pay

## 2022-07-18 ENCOUNTER — Telehealth: Payer: Self-pay

## 2022-07-18 MED ORDER — AMOXICILLIN 500 MG PO TABS: 2000.0000 mg | ORAL_TABLET | ORAL | 0 refills | Status: AC | PRN

## 2022-07-18 NOTE — Telephone Encounter (Signed)
Called and left a detailed message for the patient about the SBE prophylaxis protocol and send to his local pharmacy on file 2 grams of Amoxicillin. Stated that if he has any questions to please give our office a call.    Please arrange SBE prophylaxis for this patient.   I don't see an allergy to Amoxicillin. Let's start Amoxicillin 2 grams PO (x 1 dose) and take 30 -60 minutes before any future dental procedure.   Thanks!   Kind Regards,  Finis Bud, NP

## 2022-07-19 DIAGNOSIS — Z8679 Personal history of other diseases of the circulatory system: Secondary | ICD-10-CM | POA: Diagnosis not present

## 2022-07-19 DIAGNOSIS — Z9889 Other specified postprocedural states: Secondary | ICD-10-CM | POA: Diagnosis not present

## 2022-07-19 NOTE — Anesthesia Preprocedure Evaluation (Addendum)
Anesthesia Evaluation  Patient identified by MRN, date of birth, ID band Patient awake    Reviewed: Allergy & Precautions, NPO status , Patient's Chart, lab work & pertinent test results  Airway Mallampati: II  TM Distance: >3 FB Neck ROM: Full    Dental no notable dental hx.    Pulmonary neg pulmonary ROS,    Pulmonary exam normal breath sounds clear to auscultation       Cardiovascular hypertension, Pt. on medications  Rhythm:Regular Rate:Normal + Systolic murmurs Type A aortic disection  S/p emergent surgical repair in 2017 subsequent extensive aneurysm of the ascending aorta, aortic arch, and descending aorta requiring a two stage surgical repair with AVR in 12/2018 at [facility]   Echo 07/13/2022 with normal EF, trivial MR, trivial PR, trivial TR, prosthetic valve stable   Neuro/Psych negative neurological ROS  negative psych ROS   GI/Hepatic negative GI ROS, Neg liver ROS,   Endo/Other  negative endocrine ROS  Renal/GU negative Renal ROS  negative genitourinary   Musculoskeletal negative musculoskeletal ROS (+)   Abdominal   Peds negative pediatric ROS (+)  Hematology negative hematology ROS (+)   Anesthesia Other Findings   Reproductive/Obstetrics negative OB ROS                            Anesthesia Physical Anesthesia Plan  ASA: 3  Anesthesia Plan: General   Post-op Pain Management: Regional block*   Induction: Intravenous  PONV Risk Score and Plan: 2 and Ondansetron, Dexamethasone and Treatment may vary due to age or medical condition  Airway Management Planned: LMA  Additional Equipment:   Intra-op Plan:   Post-operative Plan: Extubation in OR  Informed Consent: I have reviewed the patients History and Physical, chart, labs and discussed the procedure including the risks, benefits and alternatives for the proposed anesthesia with the patient or authorized representative  who has indicated his/her understanding and acceptance.     Dental advisory given  Plan Discussed with: CRNA and Surgeon  Anesthesia Plan Comments: (See PAT note 07/11/2022)       Anesthesia Quick Evaluation

## 2022-07-19 NOTE — Progress Notes (Signed)
Anesthesia Chart Review   Case: [id number] Date/Time: 07/20/22 0845   Procedures:      KNEE ARTHROSCOPY WITH MEDIAL TIBIA SUBCHONDROPLASTY (Left: Knee) - 75     KNEE ARTHROSCOPY WITH PARTIAL MEDIAL MENISECTOMY (Left: Knee) - 75   Anesthesia type: Choice   Pre-op diagnosis: Left knee medial meniscus tear, medial tibia stress fracture   Location: [facility] ROOM 08 / [facility]   Surgeons: Nicholes Stairs, MD       DISCUSSION:61 y.o. never smoker with h/o HTN, s/p AVR, s/p aortic dissection repair, left knee medial meniscus tear, medial tibia stress fracture scheduled for above procedure 07/20/2022 with Dr. Nicholes Stairs.   Pt last seen by cardiology 07/17/2022. Per OV note, "Mr. Wisdom's perioperative risk of a major cardiac event is 0.4% according to the Revised Cardiac Risk Index (RCRI).  Therefore, he is at low risk for perioperative complications.   His functional capacity is excellent at 9.89 METs according to the [facility] Activity Status Index (DASI). Recommendations: According to ACC/AHA guidelines, no further cardiovascular testing needed.  The patient may proceed to surgery at acceptable risk.   Antiplatelet and/or Anticoagulation Recommendations: Prefer Aspirin to be continued perioperatively unless surgeon feels bleeding risk is too high.  Patient is not on any anticoagulation medications or other blood thinners. Patient does require SBE prophylaxis for history of AVR prior to any future dental procedure. Discussed this with patient and he verbalizes understanding. Will route note to requesting party. "  CT Angio 07/13/2022 with stable thoracic aorta.   Echo 07/13/2022 with normal EF, trivial MR, trivial PR, trivial TR, prosthetic valve stable.   Anticipate pt can proceed with planned procedure barring acute status change.   VS: BP (!) 180/98   Pulse (!) 58   Temp 36.9 C (Oral)   Resp 16   Ht '6\' 1"'$  (1.854 m)   Wt 123.4 kg   SpO2 98%   BMI 35.89 kg/m   PROVIDERS: Lawerance Cruel, MD is PCP    LABS: Labs reviewed: Acceptable for surgery. (all labs ordered are listed, but only abnormal results are displayed)  Labs Reviewed  BASIC METABOLIC PANEL - Abnormal; Notable for the following components:      Result Value   Glucose, Bld 115 (*)    All other components within normal limits  CBC - Abnormal; Notable for the following components:   HCT 38.9 (*)    All other components within normal limits     IMAGES:   EKG:   CV:  CT Angio 07/13/2022 Impression:  1.  Stable thoracic aorta post surgical changes with unchanged diameter of  dilated sinuses of Valsalva (measuring up to 5.0 cm) and the thoracic aorta  measuring up to 5.8 cm at the distal aortic arch. No evidence of  postoperative complications or endoleak.  2.  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  3.  Similar, mild hypoattenuating leaflet thickening (HALT) of the  bioprosthetic aortic valve.   Echo 07/13/2022 INTERPRETATION ---------------------------------------------------------------    NORMAL LEFT VENTRICULAR SYSTOLIC FUNCTION WITH MODERATE LVH    NORMAL RIGHT VENTRICULAR SYSTOLIC FUNCTION    VALVULAR REGURGITATION: TRIVIAL MR, TRIVIAL PR, TRIVIAL TR    PROSTHETIC VALVE(S): BIOPROSTHETIC AoV    3D acquisition and reconstructions were performed as part of this    examination to more accurately quantify the effects of identified    structural abnormalities as part of the exam. (post-processing on an  Independent workstation).   Echo 07/28/2018 - Left ventricle: The cavity size was moderately dilated. Left    ventricular geometry showed evidence of eccentric hypertrophy,    with moderately increased relative wall thickness. Systolic    function was normal. The estimated ejection fraction was in the    range of 55% to 60%. Features are consistent with a pseudonormal    left ventricular filling pattern,  with concomitant abnormal    relaxation and increased filling pressure (grade 2 diastolic    dysfunction).  - Aortic valve: There was moderate to severe regurgitation directed    eccentrically in the LVOT and towards the mitral anterior    leaflet. Severe regurgitation is suggested by holodiastolic flow    reversal in the descending aorta.  - Mitral valve: There was mild regurgitation.  - Left atrium: The atrium was mildly dilated.  - Right ventricle: Systolic function was mildly reduced.  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    Past Surgical History:  Procedure Laterality Date   AORTIC VALVE REPLACEMENT     carotid endartarectomy      COLONOSCOPY  2012   REPLACEMENT ASCENDING AORTA N/A 03/31/2016   Procedure: REPLACEMENT OF ASCENDING AORTA USING A 44m HEMASHIELD PLATINUM VASCULAR GRAFT;  Surgeon: PIvin Poot MD;  [place] [facility]  Service: Open Heart Surgery;  Laterality: N/A;   stent placed in abdomen      subclavian stenosis       MEDICATIONS:  amoxicillin (AMOXIL) 500 MG tablet   aspirin 81 MG EC tablet   carvedilol (COREG) 25 MG tablet   Cholecalciferol (VITAMIN D3) 50 MCG (2000 UT) TABS   hydrochlorothiazide (HYDRODIURIL) 25 MG tablet   Multiple Vitamins-Minerals (MULTIVITAMIN WITH MINERALS) tablet   olmesartan (BENICAR) 40 MG tablet   Omega-3 1000 MG CAPS   rosuvastatin (CRESTOR) 20 MG tablet   tamsulosin (FLOMAX) 0.4 MG CAPS capsule   No current facility-administered medications for this encounter.     JKonrad FelixWard, PA-C [facility] Pre-Surgical Testing [phone]

## 2022-07-20 ENCOUNTER — Other Ambulatory Visit: Payer: Self-pay

## 2022-07-20 ENCOUNTER — Ambulatory Visit (HOSPITAL_COMMUNITY): Payer: 59 | Admitting: Physician Assistant

## 2022-07-20 ENCOUNTER — Ambulatory Visit (HOSPITAL_BASED_OUTPATIENT_CLINIC_OR_DEPARTMENT_OTHER): Payer: 59

## 2022-07-20 ENCOUNTER — Ambulatory Visit (HOSPITAL_COMMUNITY)
Admission: RE | Admit: 2022-07-20 | Discharge: 2022-07-20 | Disposition: A | Discharge status: Home / Self Care | Payer: 59 | Source: Ambulatory Visit | Attending: Orthopedic Surgery | Admitting: Orthopedic Surgery

## 2022-07-20 ENCOUNTER — Encounter (HOSPITAL_COMMUNITY)
Admission: RE | Disposition: A | Discharge status: Home / Self Care | Payer: Self-pay | Source: Ambulatory Visit | Attending: Orthopedic Surgery

## 2022-07-20 ENCOUNTER — Ambulatory Visit (HOSPITAL_COMMUNITY): Payer: 59

## 2022-07-20 ENCOUNTER — Encounter (HOSPITAL_COMMUNITY): Payer: Self-pay | Admitting: Orthopedic Surgery

## 2022-07-20 DIAGNOSIS — I10 Essential (primary) hypertension: Secondary | ICD-10-CM

## 2022-07-20 DIAGNOSIS — M84362A Stress fracture, left tibia, initial encounter for fracture: Secondary | ICD-10-CM | POA: Diagnosis not present

## 2022-07-20 DIAGNOSIS — X58XXXA Exposure to other specified factors, initial encounter: Secondary | ICD-10-CM | POA: Insufficient documentation

## 2022-07-20 DIAGNOSIS — M94262 Chondromalacia, left knee: Secondary | ICD-10-CM

## 2022-07-20 DIAGNOSIS — M25562 Pain in left knee: Secondary | ICD-10-CM | POA: Diagnosis not present

## 2022-07-20 DIAGNOSIS — S83242A Other tear of medial meniscus, current injury, left knee, initial encounter: Secondary | ICD-10-CM | POA: Diagnosis not present

## 2022-07-20 DIAGNOSIS — Z87891 Personal history of nicotine dependence: Secondary | ICD-10-CM

## 2022-07-20 DIAGNOSIS — M2242 Chondromalacia patellae, left knee: Secondary | ICD-10-CM | POA: Diagnosis not present

## 2022-07-20 DIAGNOSIS — M84369A Stress fracture, unspecified tibia and fibula, initial encounter for fracture: Secondary | ICD-10-CM | POA: Insufficient documentation

## 2022-07-20 DIAGNOSIS — Z96652 Presence of left artificial knee joint: Secondary | ICD-10-CM | POA: Diagnosis not present

## 2022-07-20 DIAGNOSIS — Z79899 Other long term (current) drug therapy: Secondary | ICD-10-CM | POA: Insufficient documentation

## 2022-07-20 DIAGNOSIS — Z471 Aftercare following joint replacement surgery: Secondary | ICD-10-CM | POA: Diagnosis not present

## 2022-07-20 DIAGNOSIS — G8918 Other acute postprocedural pain: Secondary | ICD-10-CM | POA: Diagnosis not present

## 2022-07-20 DIAGNOSIS — Z01818 Encounter for other preprocedural examination: Secondary | ICD-10-CM

## 2022-07-20 DIAGNOSIS — S83232A Complex tear of medial meniscus, current injury, left knee, initial encounter: Secondary | ICD-10-CM | POA: Diagnosis not present

## 2022-07-20 HISTORY — PX: KNEE ARTHROSCOPY WITH MEDIAL MENISECTOMY: SHX5651

## 2022-07-20 HISTORY — PX: KNEE ARTHROSCOPY WITH SUBCHONDROPLASTY: SHX6732

## 2022-07-20 SURGERY — ARTHROSCOPY, KNEE, WITH SUBCHONDROPLASTY
Anesthesia: General | Site: Knee | Laterality: Left

## 2022-07-20 MED ORDER — PHENYLEPHRINE 80 MCG/ML (10ML) SYRINGE FOR IV PUSH (FOR BLOOD PRESSURE SUPPORT)
PREFILLED_SYRINGE | INTRAVENOUS | Status: AC
  Filled 2022-07-20: qty 10

## 2022-07-20 MED ORDER — EPHEDRINE SULFATE-NACL 50-0.9 MG/10ML-% IV SOSY
PREFILLED_SYRINGE | INTRAVENOUS | Status: DC | PRN
  Administered 2022-07-20 (×2): 10 mg via INTRAVENOUS
  Administered 2022-07-20: 5 mg via INTRAVENOUS

## 2022-07-20 MED ORDER — ACETAMINOPHEN 10 MG/ML IV SOLN
INTRAVENOUS | Status: DC | PRN
  Administered 2022-07-20: 1000 mg via INTRAVENOUS

## 2022-07-20 MED ORDER — ONDANSETRON HCL 4 MG/2ML IJ SOLN
INTRAMUSCULAR | Status: AC
  Filled 2022-07-20: qty 2

## 2022-07-20 MED ORDER — GLYCOPYRROLATE 0.2 MG/ML IJ SOLN
INTRAMUSCULAR | Status: DC | PRN
  Administered 2022-07-20: .2 mg via INTRAVENOUS

## 2022-07-20 MED ORDER — KETOROLAC TROMETHAMINE 30 MG/ML IJ SOLN: 30.0000 mg | Freq: Once | INTRAMUSCULAR | Status: AC | PRN

## 2022-07-20 MED ORDER — ACETAMINOPHEN 10 MG/ML IV SOLN
INTRAVENOUS | Status: AC
  Filled 2022-07-20: qty 100

## 2022-07-20 MED ORDER — ATROPINE SULFATE 0.4 MG/ML IV SOLN
INTRAVENOUS | Status: DC | PRN
  Administered 2022-07-20: .4 mg via INTRAVENOUS

## 2022-07-20 MED ORDER — HYDROMORPHONE HCL 1 MG/ML IJ SOLN
INTRAMUSCULAR | Status: AC
  Administered 2022-07-20: 0.5 mg via INTRAVENOUS
  Filled 2022-07-20: qty 1

## 2022-07-20 MED ORDER — EPINEPHRINE 1 MG/10ML IJ SOSY
PREFILLED_SYRINGE | INTRAMUSCULAR | Status: AC
  Filled 2022-07-20: qty 10

## 2022-07-20 MED ORDER — FENTANYL CITRATE PF 50 MCG/ML IJ SOSY
PREFILLED_SYRINGE | INTRAMUSCULAR | Status: AC
  Administered 2022-07-20: 50 ug via INTRAVENOUS
  Filled 2022-07-20: qty 2

## 2022-07-20 MED ORDER — EPHEDRINE 5 MG/ML INJ
INTRAVENOUS | Status: AC
  Filled 2022-07-20: qty 5

## 2022-07-20 MED ORDER — BUPIVACAINE-EPINEPHRINE (PF) 0.25% -1:200000 IJ SOLN
INTRAMUSCULAR | Status: AC
  Filled 2022-07-20: qty 30

## 2022-07-20 MED ORDER — KETOROLAC TROMETHAMINE 30 MG/ML IJ SOLN
INTRAMUSCULAR | Status: AC
  Filled 2022-07-20: qty 1

## 2022-07-20 MED ORDER — LACTATED RINGERS IV SOLN: INTRAVENOUS | Status: DC

## 2022-07-20 MED ORDER — CEFAZOLIN IN SODIUM CHLORIDE 3-0.9 GM/100ML-% IV SOLN
3.0000 g | INTRAVENOUS | Status: AC
  Administered 2022-07-20: 3 g via INTRAVENOUS
  Filled 2022-07-20: qty 100

## 2022-07-20 MED ORDER — OXYCODONE-ACETAMINOPHEN 10-325 MG PO TABS: 1.0000 | ORAL_TABLET | Freq: Four times a day (QID) | ORAL | 0 refills | Status: DC | PRN

## 2022-07-20 MED ORDER — LIDOCAINE 2% (20 MG/ML) 5 ML SYRINGE
INTRAMUSCULAR | Status: DC | PRN
  Administered 2022-07-20: 100 mg via INTRAVENOUS

## 2022-07-20 MED ORDER — MIDAZOLAM HCL 2 MG/2ML IJ SOLN
INTRAMUSCULAR | Status: AC
  Administered 2022-07-20: 1 mg via INTRAVENOUS
  Filled 2022-07-20: qty 2

## 2022-07-20 MED ORDER — CLONIDINE HCL (ANALGESIA) 100 MCG/ML EP SOLN
EPIDURAL | Status: DC | PRN
  Administered 2022-07-20: 100 ug

## 2022-07-20 MED ORDER — BUPIVACAINE HCL (PF) 0.25 % IJ SOLN
INTRAMUSCULAR | Status: AC
  Filled 2022-07-20: qty 30

## 2022-07-20 MED ORDER — DEXAMETHASONE SODIUM PHOSPHATE 10 MG/ML IJ SOLN
INTRAMUSCULAR | Status: DC | PRN
  Administered 2022-07-20: 10 mg via INTRAVENOUS

## 2022-07-20 MED ORDER — PHENYLEPHRINE 80 MCG/ML (10ML) SYRINGE FOR IV PUSH (FOR BLOOD PRESSURE SUPPORT)
PREFILLED_SYRINGE | INTRAVENOUS | Status: DC | PRN
  Administered 2022-07-20 (×2): 80 ug via INTRAVENOUS
  Administered 2022-07-20: 160 ug via INTRAVENOUS
  Administered 2022-07-20 (×3): 80 ug via INTRAVENOUS

## 2022-07-20 MED ORDER — DEXAMETHASONE SODIUM PHOSPHATE 10 MG/ML IJ SOLN
INTRAMUSCULAR | Status: AC
  Filled 2022-07-20: qty 1

## 2022-07-20 MED ORDER — MIDAZOLAM HCL 2 MG/2ML IJ SOLN: 1.0000 mg | Freq: Once | INTRAMUSCULAR | Status: AC

## 2022-07-20 MED ORDER — FENTANYL CITRATE PF 50 MCG/ML IJ SOSY: 50.0000 ug | PREFILLED_SYRINGE | Freq: Once | INTRAMUSCULAR | Status: AC

## 2022-07-20 MED ORDER — LIDOCAINE HCL (PF) 2 % IJ SOLN
INTRAMUSCULAR | Status: AC
  Filled 2022-07-20: qty 5

## 2022-07-20 MED ORDER — ATROPINE SULFATE 0.4 MG/ML IV SOLN
INTRAVENOUS | Status: AC
  Filled 2022-07-20: qty 1

## 2022-07-20 MED ORDER — OXYCODONE HCL 5 MG PO TABS: 5.0000 mg | ORAL_TABLET | Freq: Once | ORAL | Status: AC | PRN

## 2022-07-20 MED ORDER — ORAL CARE MOUTH RINSE: 15.0000 mL | Freq: Once | OROMUCOSAL | Status: AC

## 2022-07-20 MED ORDER — HYDROMORPHONE HCL 1 MG/ML IJ SOLN
0.2500 mg | INTRAMUSCULAR | Status: DC | PRN
  Administered 2022-07-20: 0.5 mg via INTRAVENOUS

## 2022-07-20 MED ORDER — FENTANYL CITRATE (PF) 100 MCG/2ML IJ SOLN
INTRAMUSCULAR | Status: AC
  Filled 2022-07-20: qty 2

## 2022-07-20 MED ORDER — FENTANYL CITRATE (PF) 100 MCG/2ML IJ SOLN
INTRAMUSCULAR | Status: DC | PRN
  Administered 2022-07-20: 50 ug via INTRAVENOUS
  Administered 2022-07-20 (×2): 25 ug via INTRAVENOUS

## 2022-07-20 MED ORDER — METHOCARBAMOL 500 MG IVPB - SIMPLE MED
INTRAVENOUS | Status: AC
  Administered 2022-07-20: 500 mg
  Filled 2022-07-20: qty 55

## 2022-07-20 MED ORDER — PROPOFOL 10 MG/ML IV BOLUS
INTRAVENOUS | Status: AC
  Filled 2022-07-20: qty 20

## 2022-07-20 MED ORDER — ONDANSETRON HCL 4 MG/2ML IJ SOLN
INTRAMUSCULAR | Status: DC | PRN
  Administered 2022-07-20: 4 mg via INTRAVENOUS

## 2022-07-20 MED ORDER — SODIUM CHLORIDE 0.9 % IR SOLN
Status: DC | PRN
  Administered 2022-07-20: 6000 mL

## 2022-07-20 MED ORDER — GLYCOPYRROLATE 0.2 MG/ML IJ SOLN
INTRAMUSCULAR | Status: AC
  Filled 2022-07-20: qty 1

## 2022-07-20 MED ORDER — KETOROLAC TROMETHAMINE 30 MG/ML IJ SOLN
INTRAMUSCULAR | Status: AC
  Administered 2022-07-20: 30 mg via INTRAVENOUS
  Filled 2022-07-20: qty 1

## 2022-07-20 MED ORDER — ONDANSETRON HCL 4 MG/2ML IJ SOLN: 4.0000 mg | Freq: Once | INTRAMUSCULAR | Status: DC | PRN

## 2022-07-20 MED ORDER — ONDANSETRON HCL 4 MG PO TABS: 4.0000 mg | ORAL_TABLET | Freq: Three times a day (TID) | ORAL | 0 refills | Status: DC | PRN

## 2022-07-20 MED ORDER — PROPOFOL 10 MG/ML IV BOLUS
INTRAVENOUS | Status: DC | PRN
  Administered 2022-07-20: 270 mg via INTRAVENOUS

## 2022-07-20 MED ORDER — OXYCODONE HCL 5 MG PO TABS
ORAL_TABLET | ORAL | Status: AC
  Administered 2022-07-20: 5 mg via ORAL
  Filled 2022-07-20: qty 1

## 2022-07-20 MED ORDER — ROPIVACAINE HCL 5 MG/ML IJ SOLN
INTRAMUSCULAR | Status: DC | PRN
  Administered 2022-07-20: 30 mL via PERINEURAL

## 2022-07-20 MED ORDER — OXYCODONE HCL 5 MG/5ML PO SOLN: 5.0000 mg | Freq: Once | ORAL | Status: AC | PRN

## 2022-07-20 MED ORDER — CHLORHEXIDINE GLUCONATE 0.12 % MT SOLN
15.0000 mL | Freq: Once | OROMUCOSAL | Status: AC
  Administered 2022-07-20: 15 mL via OROMUCOSAL

## 2022-07-20 SURGICAL SUPPLY — 49 items
BAG COUNTER SPONGE SURGICOUNT (BAG) ×1 IMPLANT
BANDAGE ESMARK 6X9 LF (GAUZE/BANDAGES/DRESSINGS) IMPLANT
BLADE SHAVER TORPEDO 4X13 (MISCELLANEOUS) ×1 IMPLANT
BNDG ELASTIC 6X5.8 VLCR STR LF (GAUZE/BANDAGES/DRESSINGS) ×1 IMPLANT
BNDG ESMARK 6X9 LF (GAUZE/BANDAGES/DRESSINGS)
BURR CLEARCUT OVAL 5.5X13 (MISCELLANEOUS) IMPLANT
BURR OVAL 12 FL 5.5X13 (MISCELLANEOUS)
CLSR STERI-STRIP ANTIMIC 1/2X4 (GAUZE/BANDAGES/DRESSINGS) IMPLANT
COVER MAYO STAND STRL (DRAPES) ×1 IMPLANT
COVER SURGICAL LIGHT HANDLE (MISCELLANEOUS) ×1 IMPLANT
CUFF TOURN SGL QUICK 34 (TOURNIQUET CUFF)
CUFF TRNQT CYL 34X4.125X (TOURNIQUET CUFF) IMPLANT
DRAPE ARTHROSCOPY W/POUCH 114 (DRAPES) ×1 IMPLANT
DRAPE C-ARM 42X120 X-RAY (DRAPES) ×1 IMPLANT
DRAPE SHEET LG 3/4 BI-LAMINATE (DRAPES) IMPLANT
DRAPE U-SHAPE 47X51 STRL (DRAPES) ×1 IMPLANT
DURAPREP 26ML APPLICATOR (WOUND CARE) ×1 IMPLANT
GAUZE 4X4 16PLY ~~LOC~~+RFID DBL (SPONGE) ×1 IMPLANT
GAUZE PAD ABD 8X10 STRL (GAUZE/BANDAGES/DRESSINGS) ×2 IMPLANT
GAUZE SPONGE 4X4 12PLY STRL (GAUZE/BANDAGES/DRESSINGS) ×1 IMPLANT
GAUZE XEROFORM 1X8 LF (GAUZE/BANDAGES/DRESSINGS) ×1 IMPLANT
GLOVE BIO SURGEON STRL SZ7.5 (GLOVE) ×2 IMPLANT
GLOVE BIOGEL PI IND STRL 8 (GLOVE) ×2 IMPLANT
GOWN STRL REUS W/ TWL XL LVL3 (GOWN DISPOSABLE) ×2 IMPLANT
GOWN STRL REUS W/TWL XL LVL3 (GOWN DISPOSABLE) ×2
GRAFT FILLER BONE 5ML (Knees) IMPLANT
IV NS IRRIG 3000ML ARTHROMATIC (IV SOLUTION) ×2 IMPLANT
KIT ACCUFILL 5CC (Knees) ×1 IMPLANT
KIT BASIN OR (CUSTOM PROCEDURE TRAY) ×1 IMPLANT
KIT KNEE SCP 414.502 (Knees) ×1 IMPLANT
KIT TURNOVER KIT A (KITS) IMPLANT
MANIFOLD NEPTUNE II (INSTRUMENTS) ×1 IMPLANT
PACK ARTHROSCOPY DSU (CUSTOM PROCEDURE TRAY) ×1 IMPLANT
PACK ARTHROSCOPY WL (CUSTOM PROCEDURE TRAY) ×1 IMPLANT
PADDING CAST COTTON 6X4 STRL (CAST SUPPLIES) IMPLANT
PORT APPOLLO RF 90DEGREE MULTI (SURGICAL WAND) IMPLANT
PROBE HOOK APOLLO (SURGICAL WAND) IMPLANT
STRIP CLOSURE SKIN 1/2X4 (GAUZE/BANDAGES/DRESSINGS) ×1 IMPLANT
SUT ETHILON 3 0 PS 1 (SUTURE) IMPLANT
SUT ETHILON 4 0 PS 2 18 (SUTURE) ×1 IMPLANT
SUT MNCRL AB 3-0 PS2 18 (SUTURE) ×1 IMPLANT
SUT MNCRL AB 3-0 PS2 27 (SUTURE) IMPLANT
SYR CONTROL 10ML LL (SYRINGE) IMPLANT
TOWEL OR 17X26 10 PK STRL BLUE (TOWEL DISPOSABLE) ×1 IMPLANT
TUBING ARTHROSCOPY IRRIG 16FT (MISCELLANEOUS) ×1 IMPLANT
TUBING CONNECTING 10 (TUBING) ×1 IMPLANT
WAND APOLLORF SJ50 AR-9845 (SURGICAL WAND) IMPLANT
WATER STERILE IRR 500ML POUR (IV SOLUTION) ×1 IMPLANT
WRAP KNEE MAXI GEL POST OP (GAUZE/BANDAGES/DRESSINGS) ×1 IMPLANT

## 2022-07-20 NOTE — Op Note (Signed)
Surgeon(s): Nicholes Stairs, MD  Assistant: Jonelle Sidle, PA-C  Assistant attestation, PA Mcclung present for the entire procedure.   ANESTHESIA:  general, and regional   FLUIDS: Per anesthesia record.    ESTIMATED BLOOD LOSS: minimal     PREOPERATIVE DIAGNOSES:  1.  Left knee medial meniscus tear 2.  Left knee medial tibial condyle insufficiency fracture 3.   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  Left knee arthroscopically aided treatment of medial tibial plateau insufficiency fracture with percutaneous internal fixation (subchondroplasty)  2.  Left knee knee arthroscopy with arthroscopic partial medial meniscectomy 3.  Left knee arthroscopic chondroplasty, medial femoral condyle and medial tibial plateau   Implant: Flowable calcium phosphate, 3 mL. Zimmer   DESCRIPTION OF PROCEDURE: The patient has a Left knee medial meniscus tear. They have had pain that has been refractory to conservative management. Their preoperative MRI demonstrated subchondral bone marrow edema and insufficiency fractures of the medial tibial plateau as well as the medial meniscus tear. Plans are to proceed with partial medial meniscectomy, internal fixation of subchondral insufficiency fractures with flowable calcium phosphate, and diagnostic arthroscopy with debridement as indicated. Full discussion held regarding risks benefits alternatives and complications related surgical intervention. Conservative care options reviewed. All questions answered.   The patient was identified in the preoperative holding area and the operative extremity was marked. The patient was brought to the operating room and transferred to operating table in a supine position. Satisfactory general anesthesia was induced by anesthesiology.     Standard anterolateral, anteromedial arthroscopy portals were obtained. The anteromedial portal  was obtained with a spinal needle for localization under direct visualization with subsequent diagnostic findings.    Anteromedial and anterolateral chambers: moderate synovitis. The synovitis was debrided with a 4.5 mm full radius shaver through both the anteromedial and lateral portals.    Suprapatellar pouch and gutters: mild synovitis or debris. Patella chondral surface: Grade 1 Trochlear chondral surface: Grade 2 Patellofemoral tracking: level Medial meniscus: posterior horn and mid body complex degenerative tearing.  Horizontal tear of the mid body and near complete radial tear of the posterior root but some fibers were in continuity. Medial femoral condyle flexion bearing surface: Grade 3 Medial femoral condyle extension bearing surface: Grade 2 Medial tibial plateau: Grade 2 with patchy grade 4 adjacent to mid body tear Anterior cruciate ligament:stable Posterior cruciate ligament:stable Lateral meniscus: no tear.   Lateral femoral condyle flexion bearing surface: Grade 1 Lateral femoral condyle extension bearing surface: Grade 0 Lateral tibial plateau: Grade 1   Medial meniscus tear was debrided using biters and motorized shaver alternating until a stable remnant was left. Upon completion the probe was used to evaluate and assess the remaining meniscus which was gleaned to be stable.   Chondroplasty was achieved on the medial femoral condyle using a motorized shaver to debride the grade 3 unstable cartilage. Completion of the chondroplasty left A medial femoral condyle with smooth stable surface. There was no full-thickness component noted.   Next we turned our attention to the internal fixation of the medial tibial condyle. Arthroscopically we evaluated the condyle noted there was no loose cartilage or debris surrounding the lesion and the fracture did not propagate to the joint surface. Using preoperative MRI we targeted the delivery device to just under the subchondral density and  in the medial tibial plateau. This was achieved with intraoperative fluoroscopy. Once accurate placement was noted on  2 views and confirmed we delivered 5 mL of flowable calcium phosphate into the lesion. We left the cannulas in place for approximately 8 minutes while the implant hardened. We removed the cannulas and again took 2 views of fluoroscopic pictures to confirm there was no extravasation outside of the bone. There was none noted.   After completion of synovectomy, diagnostic exam, and debridements as described, all compartments were checked and no residual debris remained. Hemostasis was achieved with the cautery wand. The portals were approximated with nylon suture. All excess fluid was expressed from the joint.  Xeroform sterile gauze dressings were applied followed by Ace bandage and ice pack.    There were no immediate competitions and all counts were correct.   DISPOSITION: The patient was awakened from general anesthetic, extubated, taken to the recovery room in medically stable condition, no apparent complications. The patient may be weightbearing as tolerated to the operative lower extremity with crutches.  Range of motion of left knee as tolerated.  They will use bid asa for DVT ppx, and return in 2 weeks for suture removal.   Nicholes Stairs

## 2022-07-20 NOTE — Discharge Instructions (Signed)

## 2022-07-20 NOTE — Anesthesia Procedure Notes (Addendum)
Anesthesia Regional Block: Adductor canal block   Pre-Anesthetic Checklist: , timeout performed,  Correct Patient, Correct Site, Correct Laterality,  Correct Procedure, Correct Position, site marked,  Risks and benefits discussed,  Surgical consent,  Pre-op evaluation,  At surgeon's request and post-op pain management  Laterality: Left  Prep: chloraprep       Needles:  Injection technique: Single-shot  Needle Type: Echogenic Needle     Needle Length: 9cm      Additional Needles:   Procedures:,,,, ultrasound used (permanent image in chart),,    Narrative:  Start time: 07/20/2022 8:12 AM End time: 07/20/2022 8:20 AM Injection made incrementally with aspirations every 5 mL.  Performed by: Personally  Anesthesiologist: Myrtie Soman, MD  Additional Notes: Patient tolerated the procedure well without complications

## 2022-07-20 NOTE — Anesthesia Procedure Notes (Signed)
Procedure Name: LMA Insertion Date/Time: 07/20/2022 9:08 AM  Performed by: Gwyndolyn Saxon, CRNAPre-anesthesia Checklist: Patient identified, Emergency Drugs available, Suction available and Patient being monitored Patient Re-evaluated:Patient Re-evaluated prior to induction Oxygen Delivery Method: Circle system utilized Preoxygenation: Pre-oxygenation with 100% oxygen Induction Type: IV induction Ventilation: Mask ventilation without difficulty LMA: LMA inserted LMA Size: 5.0 Number of attempts: 1 Placement Confirmation: positive ETCO2 and breath sounds checked- equal and bilateral Tube secured with: Tape Dental Injury: Teeth and Oropharynx as per pre-operative assessment

## 2022-07-20 NOTE — Brief Op Note (Signed)
07/20/2022  10:15 AM  PATIENT:  Jon Rodriguez  61 y.o. male  PRE-OPERATIVE DIAGNOSIS:  Left knee medial meniscus tear, medial tibia stress fracture  POST-OPERATIVE DIAGNOSIS:  Left knee medial meniscus tear, medial tibia stress fracture  PROCEDURE:  Procedure(s) with comments: KNEE ARTHROSCOPY WITH MEDIAL TIBIA SUBCHONDROPLASTY (Left) - 75 KNEE ARTHROSCOPY WITH PARTIAL MEDIAL MENISECTOMY (Left) - 75  SURGEON:  Surgeon(s) and Role:    Stann Mainland, Elly Modena, MD - Primary  PHYSICIAN ASSISTANT: Jonelle Sidle, PA-C   ANESTHESIA:   regional and general  EBL:  5 mL   BLOOD ADMINISTERED:none  DRAINS: none   LOCAL MEDICATIONS USED:  NONE  SPECIMEN:  No Specimen  DISPOSITION OF SPECIMEN:  N/A  COUNTS:  YES  TOURNIQUET:  * No tourniquets in log *  DICTATION: .Note written in EPIC  PLAN OF CARE: Discharge to home after PACU  PATIENT DISPOSITION:  PACU - hemodynamically stable.   Delay start of Pharmacological VTE agent (>24hrs) due to surgical blood loss or risk of bleeding: not applicable

## 2022-07-20 NOTE — Anesthesia Postprocedure Evaluation (Signed)
Anesthesia Post Note  Patient: Jon Rodriguez  Procedure(s) Performed: KNEE ARTHROSCOPY WITH MEDIAL TIBIA SUBCHONDROPLASTY (Left: Knee) KNEE ARTHROSCOPY WITH PARTIAL MEDIAL MENISECTOMY (Left: Knee)     Patient location during evaluation: PACU Anesthesia Type: General Level of consciousness: awake and alert Pain management: pain level controlled Vital Signs Assessment: post-procedure vital signs reviewed and stable Respiratory status: spontaneous breathing, nonlabored ventilation, respiratory function stable and patient connected to nasal cannula oxygen Cardiovascular status: blood pressure returned to baseline and stable Postop Assessment: no apparent nausea or vomiting Anesthetic complications: no   No notable events documented.  Last Vitals:  Vitals:   07/20/22 1045 07/20/22 1102  BP: 138/76 (!) 155/91  Pulse: 63 63  Resp: 11 16  Temp:  (!) 36.3 C  SpO2: 95% 99%    Last Pain:  Vitals:   07/20/22 1102  TempSrc: Oral  PainSc:                  [name] S

## 2022-07-20 NOTE — Anesthesia Procedure Notes (Signed)
Anesthesia Procedure Image    

## 2022-07-20 NOTE — Transfer of Care (Signed)
Immediate Anesthesia Transfer of Care Note  Patient: Domingos Riggi  Procedure(s) Performed: KNEE ARTHROSCOPY WITH MEDIAL TIBIA SUBCHONDROPLASTY (Left: Knee) KNEE ARTHROSCOPY WITH PARTIAL MEDIAL MENISECTOMY (Left: Knee)  Patient Location: PACU  Anesthesia Type:General and Regional  Level of Consciousness: awake, alert  and oriented  Airway & Oxygen Therapy: Patient Spontanous Breathing  Post-op Assessment: Report given to RN and Post -op Vital signs reviewed and stable  Post vital signs: Reviewed and stable  Last Vitals:  Vitals Value Taken Time  BP 130/76 07/20/22 1015  Temp    Pulse 73 07/20/22 1015  Resp 17 07/20/22 1015  SpO2 92 % 07/20/22 1015  Vitals shown include unvalidated device data.  Last Pain:  Vitals:   07/20/22 0728  TempSrc:   PainSc: 0-No pain         Complications: No notable events documented.

## 2022-07-20 NOTE — H&P (Signed)
ORTHOPAEDIC H&P  REQUESTING PHYSICIAN: Nicholes Stairs, MD  PCP:  Lawerance Cruel, MD  Chief Complaint: Left knee pain  HPI: Jon Rodriguez is a 61 y.o. male who complains of left knee pain and mechanical symptoms for the last almost 1 year now.  He said extensive conservative treatment with injections, outpatient therapy as well as oral medications.  Here today for arthroscopic intervention.  No new complaints at this time.  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   Past Surgical History:  Procedure Laterality Date   AORTIC VALVE REPLACEMENT     carotid endartarectomy      COLONOSCOPY  2012   REPLACEMENT ASCENDING AORTA N/A 03/31/2016   Procedure: REPLACEMENT OF ASCENDING AORTA USING A 27m HEMASHIELD PLATINUM VASCULAR GRAFT;  Surgeon: PIvin Poot MD;  Location: [facility]  Service: Open Heart Surgery;  Laterality: N/A;   stent placed in abdomen      subclavian stenosis      Social History   Socioeconomic History   Marital status: Divorced    Spouse name: Not on file   Number of children: Not on file   Years of education: Not on file   Highest education level: Not on file  Occupational History   Not on file  Tobacco Use   Smoking status: Never   Smokeless tobacco: Former  VScientific laboratory technicianUse: Never used  Substance and Sexual Activity   Alcohol use: Yes    Comment: rare   Drug use: Never   Sexual activity: Not on file  Other Topics Concern   Not on file  Social History Narrative   Not on file   Social Determinants of Health   Financial Resource Strain: Not on file  Food Insecurity: Not on file  Transportation Needs:  Not on file  Physical Activity: Not on file  Stress: Not on file  Social Connections: Not on file   Family History  Problem Relation Age of Onset   Hypertension Other        SIblings   CAD Mother        CABG   Hyperlipidemia Mother    Hypertension Father    Prostate cancer Father    Hyperlipidemia Father    Allergies  Allergen Reactions   Simvastatin     myalgias   Prior to Admission medications   Medication Sig Start Date End Date Taking? Authorizing Provider  aspirin 81 MG EC tablet Take 1 tablet (81 mg total) by mouth daily. 07/25/18  Yes Croitoru, Mihai, MD  carvedilol (COREG) 25 MG tablet TAKE 1 TABLET TWICE DAILY  WITH MEALS 03/01/22  Yes Croitoru, Mihai, MD  Cholecalciferol (VITAMIN D3) 50 MCG (2000 UT) TABS Take 2,000 Units by mouth daily.   Yes [provider]  hydrochlorothiazide (HYDRODIURIL) 25 MG tablet TAKE 1 TABLET DAILY 06/18/22  Yes Croitoru, Mihai, MD  Multiple Vitamins-Minerals (MULTIVITAMIN WITH MINERALS) tablet Take 1 tablet by mouth daily.   Yes [provider]  olmesartan (BENICAR) 40 MG tablet Take 1 tablet (40 mg total) by mouth daily. 04/03/22  Yes Croitoru, MDani Gobble MD  Omega-3 1000 MG CAPS Take 1,000 mg by mouth daily.   Yes [provider]  rosuvastatin (CRESTOR) 20 MG tablet Take 1 tablet (20 mg total) by mouth daily. 04/13/22 04/08/23 Yes Croitoru, Mihai, MD  tamsulosin (FLOMAX) 0.4 MG CAPS capsule Take 0.4 mg by mouth daily. 06/05/22  Yes [provider]  amoxicillin (AMOXIL) 500 MG tablet Take 4 tablets (2,000 mg total) by mouth as needed (30-60 minutes before any dental procedure). 07/18/22   Finis Bud, NP   No results found.  Positive ROS: All other systems have been reviewed and were otherwise negative with the exception of those mentioned in the HPI and as above.  Physical Exam: General: Alert, no acute distress Cardiovascular: No pedal edema Respiratory: No cyanosis, no use of accessory musculature GI: No  organomegaly, abdomen is soft and non-tender Skin: No lesions in the area of chief complaint Neurologic: Sensation intact distally Psychiatric: Patient is competent for consent with normal mood and affect Lymphatic: No axillary or cervical lymphadenopathy  MUSCULOSKELETAL: Left lower extremity is warm and well-perfused with no open wounds or lesions.  Distally neurovascular intact.  No signs of DVT.  Assessment: 1.  Left knee medial meniscus tear, initial encounter.  2.  Left knee medial tibial plateau stress fracture  Plan: Plan to proceed today with arthroscopic management of the left knee with partial medial meniscectomy as well as diagnostic arthroscopy and percutaneous internal fixation of the medial tibial stress fracture.  We again discussed the risk and benefits of the procedure in detail including but not limited to bleeding, infection, damage to surrounding nerves and vessels, failure of pain relief, progression of disease, stiffness, DVT, the risk of anesthesia.  He has provided informed consent.\  We will plan for discharge home postoperatively from PACU.    Nicholes Stairs, MD Cell [phone]    07/20/2022 8:31 AM

## 2022-07-23 ENCOUNTER — Encounter (HOSPITAL_COMMUNITY): Payer: Self-pay | Admitting: Orthopedic Surgery

## 2022-08-02 DIAGNOSIS — M25562 Pain in left knee: Secondary | ICD-10-CM | POA: Diagnosis not present

## 2022-08-09 DIAGNOSIS — M25562 Pain in left knee: Secondary | ICD-10-CM | POA: Diagnosis not present

## 2022-08-14 DIAGNOSIS — D122 Benign neoplasm of ascending colon: Secondary | ICD-10-CM | POA: Diagnosis not present

## 2022-08-14 DIAGNOSIS — Z86010 Personal history of colonic polyps: Secondary | ICD-10-CM | POA: Diagnosis not present

## 2022-08-14 DIAGNOSIS — K5730 Diverticulosis of large intestine without perforation or abscess without bleeding: Secondary | ICD-10-CM | POA: Diagnosis not present

## 2022-08-14 DIAGNOSIS — D123 Benign neoplasm of transverse colon: Secondary | ICD-10-CM | POA: Diagnosis not present

## 2022-08-14 DIAGNOSIS — K648 Other hemorrhoids: Secondary | ICD-10-CM | POA: Diagnosis not present

## 2022-08-14 DIAGNOSIS — D120 Benign neoplasm of cecum: Secondary | ICD-10-CM | POA: Diagnosis not present

## 2022-08-21 DIAGNOSIS — M25562 Pain in left knee: Secondary | ICD-10-CM | POA: Diagnosis not present

## 2022-08-23 ENCOUNTER — Encounter: Payer: Self-pay | Admitting: Cardiovascular Disease

## 2022-08-23 ENCOUNTER — Ambulatory Visit: Payer: 59 | Attending: Cardiovascular Disease | Admitting: Cardiovascular Disease

## 2022-08-23 VITALS — BP 162/96 | HR 71 | Ht 73.0 in | Wt 283.4 lb

## 2022-08-23 DIAGNOSIS — I10 Essential (primary) hypertension: Secondary | ICD-10-CM | POA: Diagnosis not present

## 2022-08-23 DIAGNOSIS — Z9889 Other specified postprocedural states: Secondary | ICD-10-CM

## 2022-08-23 DIAGNOSIS — Z8679 Personal history of other diseases of the circulatory system: Secondary | ICD-10-CM | POA: Diagnosis not present

## 2022-08-23 DIAGNOSIS — I441 Atrioventricular block, second degree: Secondary | ICD-10-CM

## 2022-08-23 DIAGNOSIS — R9431 Abnormal electrocardiogram [ECG] [EKG]: Secondary | ICD-10-CM

## 2022-08-23 DIAGNOSIS — Z953 Presence of xenogenic heart valve: Secondary | ICD-10-CM

## 2022-08-23 DIAGNOSIS — E7800 Pure hypercholesterolemia, unspecified: Secondary | ICD-10-CM | POA: Diagnosis not present

## 2022-08-23 DIAGNOSIS — Z79899 Other long term (current) drug therapy: Secondary | ICD-10-CM

## 2022-08-23 MED ORDER — SPIRONOLACTONE 25 MG PO TABS: 25.0000 mg | ORAL_TABLET | Freq: Every day | ORAL | 0 refills | Status: DC

## 2022-08-23 NOTE — Patient Instructions (Signed)
Medication Instructions:  START Spironolactone 25 mg daily   *If you need a refill on your cardiac medications before your next appointment, please call your pharmacy*  Lab Work: Your physician recommends that you return for lab work in 1 month:  BMP  If you have labs (blood work) drawn today and your tests are completely normal, you will receive your results only by: [place] (if you have MyChart) OR A paper copy in the mail If you have any lab test that is abnormal or we need to change your treatment, we will call you to review the results.  Testing/Procedures: NONE ordered at this time of appointment   Follow-Up: At [facility], you and your health needs are our priority.  As part of our continuing mission to provide you with exceptional heart care, we have created designated Provider Care Teams.  These Care Teams include your primary Cardiologist (physician) and Advanced Practice Providers (APPs -  Physician Assistants and Nurse Practitioners) who all work together to provide you with the care you need, when you need it.   Your next appointment:   1 year(s)  The format for your next appointment:   In Person  Provider:   Sanda Klein, MD     Other Instructions   Important Information About Sugar

## 2022-08-23 NOTE — Progress Notes (Signed)
Cardiology Office  Note    Date:  08/26/2022   ID:  Jon Rodriguez, DOB 1960-09-28, MRN [id number]  PCP:  Lawerance Cruel, MD  Cardiologist:  Sanda Klein, MD  Electrophysiologist:  None   Evaluation Performed:  Follow-Up Visit  Chief Complaint:  Aortic aneurysm   History of Present Illness:    Jon Rodriguez is a 61 y.o. male with acute type a aortic dissection in 2017 with emergency surgical repair (supra coronary ascending aorta and proximal arch replacement, 2017 Dr. Prescott Gum), subsequent progression of extensive aneurysm of the ascending aorta, aortic arch and descending aorta requiring two-stage surgical repair by Dr. 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  He has well-positioned thoracic stent grafts and widely patent carotid-carotid and aorta-left axillary bypass grafts.  The thoracic false lumen has completely thrombosed.  He has known residual dissection involving the left renal artery and the left common iliac artery. AVR 29 mm Edwards Perimount.  He recently had follow-up imaging studies at [facility] on 07/13/2022.  Echo shows normal LV function, unchanged LVH, normal function of the aortic valve bioprosthesis with mean gradient of 9 mmHg, dimensionless index 0.54 and no evidence of aortic insufficiency. CT Findings have not changed.  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  The abdominal aorta is mildly dilated  up to 3.6 cm, with an unchanged persistent dissection that extends into the left renal artery.  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  His next imaging follow-up has been scheduled in 2 years.  Clinically Jon Rodriguez is doing quite well.  He has no cardiovascular complaints.  He has intermittent left eye blepharospasm and is due to see neurology (Dr. Krista Blue) on December 11.  He continues to exercise at the gym but has had trouble with weight gain.  He was prescribed Wegovy but has been unable to get it due to the prescription shortage.  His BMI is now just over 37.  His blood pressure is a little high today, but at home is in the 130s/70s.  The systolic blood pressure is never less than 130 even at home.  The patient specifically denies any chest pain at rest exertion, dyspnea at rest or with exertion, orthopnea, paroxysmal nocturnal dyspnea, syncope, palpitations, focal neurological deficits, intermittent claudication of upper or lower extremities, lower extremity edema, unexplained weight gain, cough, hemoptysis or wheezing.   He has had a frustrating time with his attempts to lose weight.  He carefully counts his calories and exercises on a regular basis (both cardio and weights) and should be in a negative energy balance, but his weight is stuck.  He has been researching nutritional science papers and understands the role of leptin, insulin, etc.  He has tried various diets including Mediterranean diet, keto, Atkins, etc.  He continues to diligently follow up a program of regular exercise.  History of aortic aneurysm surgery - acute type A ascending aortic dissection with primary intimal tear close to the coronary ostia in 2017, repaired using a 30  mm Hemashield graft to the proximal arch and resuspension of the aortic valve for aortic insufficiency (Dr. Prescott Gum, July 2017) - Residual  dilation of the aortic root to 5.2 cm with aortic insufficiency - Continued expansion of the aorta distal to the repair, diameter 6.9 cm, new breakdown along the medial aspect of the distal anastomosis with a small contained pseudoaneurysm up to 1.4 cm and surrounding intramural hematoma.  - subsequent two-stage surgery by Dr. Ysidro Evert at [facility] for aortic aneurysm (type II hybrid arch repair).   - 01/01/2019 redo  supra coronary ascending aortic graft  (Wheat) 29 mm Carpentier-Edwards Perimount stented bovine pericardial valve, coronary reconstruction, transverse aorta placement with graft replacement, aorta to left axillary bypass. 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  Follow-up CT shows resolution of the subclavian endoleak and shows very small type II endoleak via the intercostal arteries.   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   Past Surgical History:  Procedure Laterality Date   AORTIC VALVE REPLACEMENT     carotid endartarectomy      COLONOSCOPY  2012   KNEE ARTHROSCOPY WITH MEDIAL MENISECTOMY Left 07/20/2022   Procedure: KNEE ARTHROSCOPY WITH PARTIAL MEDIAL MENISECTOMY;  Surgeon: Nicholes Stairs, MD;  Location: [facility];  Service: Orthopedics;  Laterality: Left;  75   KNEE ARTHROSCOPY WITH SUBCHONDROPLASTY Left  07/20/2022   Procedure: KNEE ARTHROSCOPY WITH MEDIAL TIBIA SUBCHONDROPLASTY;  Surgeon: Nicholes Stairs, MD;  Location: [facility];  Service: Orthopedics;  Laterality: Left;  75   REPLACEMENT ASCENDING AORTA N/A 03/31/2016   Procedure: REPLACEMENT OF ASCENDING AORTA USING A 6m HEMASHIELD PLATINUM VASCULAR GRAFT;  Surgeon: PIvin Poot MD;  [place]  Service: Open Heart Surgery;  Laterality: N/A;   stent placed in abdomen      subclavian stenosis        Current Meds  Medication Sig   aspirin 81 MG EC tablet Take 1 tablet (81 mg total) by mouth daily.   carvedilol (COREG) 25 MG tablet TAKE 1 TABLET TWICE DAILY  WITH MEALS   Cholecalciferol (VITAMIN D3) 50 MCG (2000 UT) TABS Take 2,000 Units by mouth daily.   hydrochlorothiazide (HYDRODIURIL) 25 MG tablet TAKE 1 TABLET DAILY   Multiple Vitamins-Minerals (MULTIVITAMIN WITH MINERALS) tablet Take 1 tablet by mouth daily.   olmesartan (BENICAR) 40 MG tablet Take 1 tablet (40 mg total) by mouth daily.   Omega-3 1000 MG CAPS Take 1,000 mg by mouth daily.   rosuvastatin (CRESTOR) 20 MG tablet Take 1 tablet (20 mg total) by mouth daily.   sildenafil (VIAGRA) 100 MG tablet Take 100 mg by mouth as needed.  spironolactone (ALDACTONE) 25 MG tablet Take 1 tablet (25 mg total) by mouth daily.   tamsulosin (FLOMAX) 0.4 MG CAPS capsule Take 0.4 mg by mouth daily.     Allergies:   Simvastatin   Social History   Tobacco Use   Smoking status: Never   Smokeless tobacco: Former  Scientific laboratory technician Use: Never used  Substance Use Topics   Alcohol use: Yes    Comment: rare   Drug use: Never     Family Hx: The patient's family history includes CAD in his mother; Hyperlipidemia in his father and mother; Hypertension in his father and another family member; Prostate cancer in his father.  ROS:   Please see the history of present illness.    All other systems reviewed and are negative.  Prior CV studies:   The following studies were  reviewed today:  CT angiogram of the aorta 07/13/2022, [facility]: 1.  Stable thoracic aorta post surgical changes with unchanged diameter of  dilated sinuses of Valsalva (measuring up to 5.0 cm) and the thoracic aorta  measuring up to 5.8 cm at the distal aortic arch. No evidence of  postoperative complications or endoleak.  2.  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  3.  Similar, mild hypoattenuating leaflet thickening (HALT) of the  bioprosthetic aortic valve.    Echocardiogram 07/13/2022, [facility]: NORMAL LEFT VENTRICULAR SYSTOLIC FUNCTION WITH MODERATE LVH    NORMAL RIGHT VENTRICULAR SYSTOLIC FUNCTION    VALVULAR REGURGITATION: TRIVIAL MR, TRIVIAL PR, TRIVIAL TR    PROSTHETIC VALVE(S): BIOPROSTHETIC AoV    3D acquisition and reconstructions were performed as part of this    examination to more accurately quantify the effects of identified    structural abnormalities as part of the exam. (post-processing on an    Independent workstation).     S/P AVR; NO AORTIC REGURGITATION TODAY    Aortic: No AR                  BIOPROSTHETIC AoV      2.0 m/s peak vel   17 mmHg peak grad   9 mmHg mean grad 1.7 cm2 by DOPPLER   LVOT Diam: 2.0 cm. Resting LVOT Vel: 1.1 m/s. Dimensionless Index: 0.54    Labs/Other Tests and Data Reviewed:    EKG: Ordered today and personally reviewed shows sinus rhythm with long PR interval and second-degree AV block Mobitz type I.  There is no bradycardia.  During the pause from the AV block he has an idioventricular escape beat and the overall rate is about 70 bpm.  LVH with QRS widening/atypical LBBB with QRS duration 128 ms.  He has unchanged ST segment depression and T wave inversion in the lateral leads.  QTc is prolonged at 530 ms, just slightly longer than on previous tracings.  Recent Labs: 07/11/2022: BUN 16; Creatinine, Ser 0.89; Hemoglobin 13.8; Platelets 205; Potassium 4.7; Sodium 135   07/06/2019 Hemoglobin 13.7, creatinine 0.8, potassium 4.9 Recent Lipid Panel Lab Results  Component Value Date/Time   CHOL 205 (H) 04/06/2022 10:51 AM   TRIG 71 04/06/2022 10:51 AM   HDL 71 04/06/2022 10:51 AM   CHOLHDL 2.9 04/06/2022 10:51 AM   CHOLHDL 3.3 06/11/2016 08:15 AM   LDLCALC 121 (H) 04/06/2022 10:51 AM    Wt Readings from Last 3 Encounters:  08/23/22 128.5 kg  07/20/22 125.4 kg  07/17/22 125.4 kg     Objective:    Vital Signs:  BP (!) 162/96 (BP Location: Left Arm, Patient Position: Sitting, Cuff Size: Large)   Pulse 71   Ht '6\' 1"'$  (1.854 m)   Wt 128.5 kg   SpO2 96%   BMI 37.39 kg/m     General: Alert, oriented x3, no distress, severely obese Head: no evidence of trauma, PERRL, EOMI, no exophtalmos or lid lag, no myxedema, no xanthelasma; normal ears, nose and oropharynx Neck: normal jugular venous pulsations and no hepatojugular reflux; brisk carotid pulses without delay and no carotid bruits Chest: clear to auscultation, no signs of consolidation by percussion or palpation, normal fremitus, symmetrical and full respiratory excursions Cardiovascular: normal position and quality of the apical impulse, regular rhythm, normal first and second heart sounds, 2/6 musical aortic ejection murmur radiating from the left lower sternal border and right upper sternal border towards the carotids no diastolic murmurs, rubs or gallops Abdomen: no tenderness or distention, no masses by palpation, no abnormal pulsatility or arterial bruits, normal bowel sounds, no hepatosplenomegaly Extremities: no clubbing, cyanosis or edema; 2+ radial, ulnar and brachial pulses bilaterally; 2+ right femoral, posterior tibial and dorsalis pedis pulses; 2+ left femoral, posterior tibial and dorsalis pedis pulses; no subclavian or femoral bruits Neurological: grossly nonfocal Psych: Normal mood and affect     ASSESSMENT & PLAN:    1. Hx of repair of dissecting thoracic aortic aneurysm,  Stanford type A   2. History of aortic valve replacement with porcine valve   3. Mobitz type 1 second degree atrioventricular block   4. Essential hypertension   5. Long QT interval   6. Hypercholesterolemia   7. Severe obesity (BMI 35.0-39.9) with comorbidity ([place])   8. Medication management       S/P Asc Ao Dissection/aortic aneurysm repair with redo sternotomy: Excellent recovery.  Good results on his recent work-up just last month with Dr. Ysidro Evert.  Next follow-up is planned in 2 years. AVR (bioprosthetic): Echo just performed in October at [facility] with normal valve function.  Reminded him of the need for endocarditis prophylaxis. HTN: Less well-controlled following weight gain.  Today's blood pressure is quite high, but this is typical of him in the doctor's office.  He is on maximum usual dose of carvedilol, maximum dose of olmesartan as well as on hydrochlorothiazide.  Would not increase carvedilol due to evidence of second-degree AV block Mobitz type I on his EKG.  Will add spironolactone 25 mg daily and recheck labs. Arrhythmia:   arrhythmia monitor December 2020 showed rare episodes of nonsustained VT and nonsustained atrial tachycardia, but also showed nocturnal second-degree AV block Mobitz type I with moderate bradycardia.  He does not have sleep apnea. HLP: Had myopathy with simvastatin, but seems to be tolerating rosuvastatin well.  Most recent LDL cholesterol was 101.  He has minimal evidence of atherosclerosis, but still would prefer his LDL less than 70.  Weight loss would be beneficial. Prolonged QT interval: This is at least partly due to the intraventricular conduction delay.  Avoid medications that can further prolong the QT interval. Obesity: Has been prescribed GLP-1 agonist, but unable to get it due to the supply shortage.  Patient Instructions  Medication Instructions:  START Spironolactone 25 mg daily   *If you need a refill on your cardiac medications before your next  appointment, please call your pharmacy*  Lab Work: Your physician recommends that you return for lab work in 1 month:  BMP  If you have labs (blood work) drawn today and your tests are completely normal,  you will receive your results only by: [place] (if you have MyChart) OR A paper copy in the mail If you have any lab test that is abnormal or we need to change your treatment, we will call you to review the results.  Testing/Procedures: NONE ordered at this time of appointment   Follow-Up: At [facility], you and your health needs are our priority.  As part of our continuing mission to provide you with exceptional heart care, we have created designated Provider Care Teams.  These Care Teams include your primary Cardiologist (physician) and Advanced Practice Providers (APPs -  Physician Assistants and Nurse Practitioners) who all work together to provide you with the care you need, when you need it.   Your next appointment:   1 year(s)  The format for your next appointment:   In Person  Provider:   Sanda Klein, MD     Other Instructions   Important Information About Sugar         Signed, Sanda Klein, MD  08/26/2022 2:43 PM    [place]

## 2022-08-28 ENCOUNTER — Ambulatory Visit: Payer: 59 | Admitting: Cardiovascular Disease

## 2022-08-29 DIAGNOSIS — D123 Benign neoplasm of transverse colon: Secondary | ICD-10-CM | POA: Diagnosis not present

## 2022-08-29 DIAGNOSIS — Z86010 Personal history of colonic polyps: Secondary | ICD-10-CM | POA: Diagnosis not present

## 2022-08-29 DIAGNOSIS — D120 Benign neoplasm of cecum: Secondary | ICD-10-CM | POA: Diagnosis not present

## 2022-08-29 DIAGNOSIS — K5730 Diverticulosis of large intestine without perforation or abscess without bleeding: Secondary | ICD-10-CM | POA: Diagnosis not present

## 2022-08-29 DIAGNOSIS — D122 Benign neoplasm of ascending colon: Secondary | ICD-10-CM | POA: Diagnosis not present

## 2022-08-29 DIAGNOSIS — K648 Other hemorrhoids: Secondary | ICD-10-CM | POA: Diagnosis not present

## 2022-08-30 ENCOUNTER — Other Ambulatory Visit: Payer: Self-pay | Admitting: Cardiovascular Disease

## 2022-09-03 ENCOUNTER — Ambulatory Visit: Payer: 59 | Admitting: Neurology

## 2022-09-03 ENCOUNTER — Encounter: Payer: Self-pay | Admitting: Neurology

## 2022-09-03 VITALS — BP 187/100 | HR 60 | Ht 73.0 in | Wt 280.0 lb

## 2022-09-03 DIAGNOSIS — G5132 Clonic hemifacial spasm, left: Secondary | ICD-10-CM | POA: Diagnosis not present

## 2022-09-03 NOTE — Progress Notes (Signed)
Chief Complaint  Patient presents with   New Patient (Initial Visit)    Rm 13, alone NP/paper/C. Melinda Crutch MD [place] [facility] [phone]/Intermittent L facial tic      ASSESSMENT AND PLAN  Jon Rodriguez is a 61 y.o. male   Left hemifacial spasm  Discussed option of low-dose botulism toxin a injection,  He wants to give himself of medical break, will contact clinic if he decided to proceed   DIAGNOSTIC DATA (LABS, IMAGING, TESTING) - I reviewed patient records, labs, notes, testing and imaging myself where available.   MEDICAL HISTORY:  Jon Rodriguez is a 61 year old male, seen in request by his primary care physician Dr. Harrington Challenger, Dwyane Luo, for evaluation of left facial spasm  I reviewed and summarized the referring note. PMHX. Left hemifacial spasm Ascending aortic dissection in 2017 Aortic valve, arch replacement , stent in 2020 HLD HTN  He noticed gradual onset of left hemifacial spasm since 2020, mainly involving left orbicularis oculi muscles, frequent blinking, multiple nuisance for him, does not close major limitations, over the past 3 years, it gradually getting worse, sometimes spreading to lower face  I have discussed medication treatment including botulism toxin injection, further evaluation such as MRI of the brain to rule out left facial nerve structure abnormality  He wants to hold off evaluation or treatment right now  He has been through very difficult few years, had a lot of doctors appointment, wants to give himself a break  I reviewed [facility] cardiothoracic surgeon evaluation, status post first stage redo sternotomy for aortic valve replacement, with stented bovine pericardial valve, supra coronary ascending aortic replacement, and total arch replacement January 10, 2019 followed by second stage carotid carotid bypass and TEVAR on 8/16 2020, PHYSICAL EXAM:   Vitals:   09/03/22 1053  BP: (!) 187/100  Pulse: 60  Weight: 280 lb (127 kg)  Height: '6\' 1"'$   (1.854 m)    Body mass index is 36.94 kg/m.  PHYSICAL EXAMNIATION:  Gen: NAD, conversant, well nourised, well groomed                     Cardiovascular: Regular rate rhythm, no peripheral edema, warm, nontender. Eyes: Conjunctivae clear without exudates or hemorrhage Neck: Supple, no carotid bruits. Pulmonary: Clear to auscultation bilaterally   NEUROLOGICAL EXAM:  MENTAL STATUS: Speech/cognition: Awake, alert, oriented to history taking and casual conversation CRANIAL NERVES: CN II: Visual fields are full to confrontation. Pupils are round equal and briskly reactive to light. CN III, IV, VI: extraocular movement are normal. No ptosis. CN V: Facial sensation is intact to light touch CN VII: Frequent left orbicularis oculi muscle twitching, spreading to the cheek, face still fairly symmetric if he does not have hemifacial spasm CN VIII: Hearing is normal to causal conversation. CN IX, X: Phonation is normal. CN XI: Head turning and shoulder shrug are intact  MOTOR: There is no pronator drift of out-stretched arms. Muscle bulk and tone are normal. Muscle strength is normal.  REFLEXES: Reflexes are 2+ and symmetric at the biceps, triceps, knees, and ankles. Plantar responses are flexor.  SENSORY: Intact to light touch, pinprick and vibratory sensation are intact in fingers and toes.  COORDINATION: There is no trunk or limb dysmetria noted.  GAIT/STANCE: Need to push-up up from seated position, cautious also limited by his big body habitus  REVIEW OF SYSTEMS:  Full 14 system review of systems performed and notable only for as above All other review of systems were  negative.   ALLERGIES: Allergies  Allergen Reactions   Simvastatin     myalgias    HOME MEDICATIONS: Current Outpatient Medications  Medication Sig Dispense Refill   amoxicillin (AMOXIL) 500 MG tablet Take 4 tablets (2,000 mg total) by mouth as needed (30-60 minutes before any dental procedure). 4  tablet 0   aspirin 81 MG EC tablet Take 1 tablet (81 mg total) by mouth daily.     carvedilol (COREG) 25 MG tablet TAKE 1 TABLET TWICE DAILY  WITH MEALS 180 tablet 3   Cholecalciferol (VITAMIN D3) 50 MCG (2000 UT) TABS Take 2,000 Units by mouth daily.     hydrochlorothiazide (HYDRODIURIL) 25 MG tablet TAKE 1 TABLET DAILY 90 tablet 0   Multiple Vitamins-Minerals (MULTIVITAMIN WITH MINERALS) tablet Take 1 tablet by mouth daily.     olmesartan (BENICAR) 40 MG tablet Take 1 tablet (40 mg total) by mouth daily. 90 tablet 3   Omega-3 1000 MG CAPS Take 1,000 mg by mouth daily.     rosuvastatin (CRESTOR) 20 MG tablet Take 1 tablet (20 mg total) by mouth daily. 90 tablet 3   sildenafil (VIAGRA) 100 MG tablet Take 100 mg by mouth as needed.     spironolactone (ALDACTONE) 25 MG tablet Take 1 tablet (25 mg total) by mouth daily. 30 tablet 0   tamsulosin (FLOMAX) 0.4 MG CAPS capsule Take 0.4 mg by mouth daily.     No current facility-administered medications for this visit.    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    PAST SURGICAL HISTORY: Past Surgical History:  Procedure Laterality Date   AORTIC VALVE REPLACEMENT     carotid endartarectomy      COLONOSCOPY  2012   KNEE ARTHROSCOPY WITH MEDIAL MENISECTOMY Left 07/20/2022   Procedure: KNEE ARTHROSCOPY WITH PARTIAL MEDIAL MENISECTOMY;  Surgeon: Nicholes Stairs, MD;  Location: [facility];  Service: Orthopedics;  Laterality: Left;  75   KNEE ARTHROSCOPY WITH SUBCHONDROPLASTY Left 07/20/2022   Procedure: KNEE ARTHROSCOPY WITH MEDIAL TIBIA SUBCHONDROPLASTY;  Surgeon: Nicholes Stairs, MD;   Location: [facility];  Service: Orthopedics;  Laterality: Left;  75   REPLACEMENT ASCENDING AORTA N/A 03/31/2016   Procedure: REPLACEMENT OF ASCENDING AORTA USING A 14m HEMASHIELD PLATINUM VASCULAR GRAFT;  Surgeon: PIvin Poot MD;  [place]  Service: Open Heart Surgery;  Laterality: N/A;   stent placed in abdomen      subclavian stenosis       FAMILY HISTORY: Family History  Problem Relation Age of Onset   Hypertension Other        SIblings   CAD Mother        CABG   Hyperlipidemia Mother    Hypertension Father    Prostate cancer Father    Hyperlipidemia Father     SOCIAL HISTORY: Social History   Socioeconomic History   Marital status: Divorced    Spouse name: Not on file   Number of children: Not on file   Years of education: Not on file   Highest education level: Not on file  Occupational History  Not on file  Tobacco Use   Smoking status: Never   Smokeless tobacco: Former  Scientific laboratory technician Use: Never used  Substance and Sexual Activity   Alcohol use: Yes    Comment: rare   Drug use: Never   Sexual activity: Not on file  Other Topics Concern   Not on file  Social History Narrative   Not on file   Social Determinants of Health   Financial Resource Strain: Not on file  Food Insecurity: Not on file  Transportation Needs: Not on file  Physical Activity: Not on file  Stress: Not on file  Social Connections: Not on file  Intimate Partner Violence: Not on file      Marcial Pacas, M.D. Ph.D.  [facility] [address] [place], [place] [address] Ph: [phone] Fax: [phone]  CC:  Lawerance Cruel, MD [place],  [place] [address]  Lawerance Cruel, MD

## 2022-09-07 NOTE — Addendum Note (Signed)
Addended by: Marcial Pacas on: 09/07/2022 12:57 PM   Modules accepted: Level of Service

## 2022-09-13 ENCOUNTER — Other Ambulatory Visit: Payer: Self-pay | Admitting: Cardiovascular Disease

## 2022-09-13 DIAGNOSIS — Z79899 Other long term (current) drug therapy: Secondary | ICD-10-CM | POA: Diagnosis not present

## 2022-09-13 LAB — BASIC METABOLIC PANEL
BUN/Creatinine Ratio: 21 (ref 10–24)
BUN: 19 mg/dL (ref 8–27)
CO2: 23 mmol/L (ref 20–29)
Calcium: 9.9 mg/dL (ref 8.6–10.2)
Chloride: 96 mmol/L (ref 96–106)
Creatinine, Ser: 0.92 mg/dL (ref 0.76–1.27)
Glucose: 104 mg/dL — ABNORMAL HIGH (ref 70–99)
Potassium: 5.3 mmol/L — ABNORMAL HIGH (ref 3.5–5.2)
Sodium: 132 mmol/L — ABNORMAL LOW (ref 134–144)
eGFR: 95 mL/min/{1.73_m2} (ref >59)

## 2022-09-14 ENCOUNTER — Other Ambulatory Visit: Payer: Self-pay

## 2022-09-14 ENCOUNTER — Encounter: Payer: Self-pay | Admitting: Cardiovascular Disease

## 2022-09-14 DIAGNOSIS — E875 Hyperkalemia: Secondary | ICD-10-CM

## 2022-09-15 ENCOUNTER — Other Ambulatory Visit: Payer: Self-pay | Admitting: Cardiovascular Disease

## 2022-10-16 DIAGNOSIS — E875 Hyperkalemia: Secondary | ICD-10-CM | POA: Diagnosis not present

## 2022-10-16 LAB — BASIC METABOLIC PANEL
BUN/Creatinine Ratio: 18 (ref 10–24)
BUN: 20 mg/dL (ref 8–27)
CO2: 23 mmol/L (ref 20–29)
Calcium: 9.8 mg/dL (ref 8.6–10.2)
Chloride: 95 mmol/L — ABNORMAL LOW (ref 96–106)
Creatinine, Ser: 1.11 mg/dL (ref 0.76–1.27)
Glucose: 114 mg/dL — ABNORMAL HIGH (ref 70–99)
Potassium: 5.5 mmol/L — ABNORMAL HIGH (ref 3.5–5.2)
Sodium: 131 mmol/L — ABNORMAL LOW (ref 134–144)
eGFR: 76 mL/min/{1.73_m2} (ref >59)

## 2022-10-17 ENCOUNTER — Encounter: Payer: Self-pay | Admitting: Cardiovascular Disease

## 2022-10-17 DIAGNOSIS — I10 Essential (primary) hypertension: Secondary | ICD-10-CM

## 2022-10-17 MED ORDER — OLMESARTAN MEDOXOMIL 40 MG PO TABS: 40.0000 mg | ORAL_TABLET | Freq: Every day | ORAL | 3 refills | Status: DC

## 2022-10-17 MED ORDER — ROSUVASTATIN CALCIUM 20 MG PO TABS: 20.0000 mg | ORAL_TABLET | Freq: Every day | ORAL | 3 refills | Status: DC

## 2022-10-17 MED ORDER — SPIRONOLACTONE 25 MG PO TABS: 12.5000 mg | ORAL_TABLET | Freq: Every day | ORAL | 3 refills | Status: DC

## 2022-11-07 ENCOUNTER — Other Ambulatory Visit: Payer: Self-pay | Admitting: Cardiovascular Disease

## 2022-11-13 ENCOUNTER — Encounter: Payer: Self-pay | Admitting: Cardiovascular Disease

## 2022-11-13 MED ORDER — ROSUVASTATIN CALCIUM 20 MG PO TABS: 20.0000 mg | ORAL_TABLET | Freq: Every day | ORAL | 3 refills | Status: DC

## 2022-11-14 ENCOUNTER — Telehealth: Payer: Self-pay | Admitting: Cardiovascular Disease

## 2022-11-14 NOTE — Telephone Encounter (Signed)
This is what was needed for provider response- allergy on file for simvastatin- they are wanting the okay for Rosuvastatin.   Will route to MD to review and approve. Thanks!

## 2022-11-14 NOTE — Telephone Encounter (Signed)
Pt c/o medication issue:  1. Name of Medication: rosuvastatin (CRESTOR) 20 MG tablet   2. How are you currently taking this medication (dosage and times per day)?   3. Are you having a reaction (difficulty breathing--STAT)?   4. What is your medication issue? [place] from [facility] called to report an allergy to to statin and wanted to know if Dr. Loletha Grayer still wanted to fill it.   Reference number: [id number]

## 2022-11-16 NOTE — Telephone Encounter (Signed)
Called pharmacy, made aware of response from MD.   They will refill medication.   Thanks!

## 2022-12-26 ENCOUNTER — Encounter: Payer: Self-pay | Admitting: Cardiovascular Disease

## 2022-12-26 MED ORDER — CARVEDILOL 12.5 MG PO TABS: 25.0000 mg | ORAL_TABLET | Freq: Two times a day (BID) | ORAL | 3 refills | Status: DC

## 2022-12-26 NOTE — Telephone Encounter (Signed)
Please reduce carvedilol to 12.5 mg twice daily

## 2022-12-26 NOTE — Telephone Encounter (Signed)
RX updated. Sent to pharmacy.

## 2023-01-02 DIAGNOSIS — S46811A Strain of other muscles, fascia and tendons at shoulder and upper arm level, right arm, initial encounter: Secondary | ICD-10-CM | POA: Diagnosis not present

## 2023-01-17 DIAGNOSIS — M542 Cervicalgia: Secondary | ICD-10-CM | POA: Diagnosis not present

## 2023-01-17 DIAGNOSIS — M25511 Pain in right shoulder: Secondary | ICD-10-CM | POA: Diagnosis not present

## 2023-02-01 ENCOUNTER — Telehealth: Payer: Self-pay

## 2023-02-01 ENCOUNTER — Encounter: Payer: Self-pay | Admitting: Cardiovascular Disease

## 2023-02-01 MED ORDER — CARVEDILOL 12.5 MG PO TABS: 25.0000 mg | ORAL_TABLET | Freq: Two times a day (BID) | ORAL | 3 refills | Status: DC

## 2023-02-01 NOTE — Telephone Encounter (Signed)
Yes, he is to stay on 12.5 mg twice daily and send in a Rx for that dose please.

## 2023-02-04 ENCOUNTER — Encounter: Payer: Self-pay | Admitting: Physical Therapy

## 2023-02-04 ENCOUNTER — Other Ambulatory Visit: Payer: Self-pay

## 2023-02-04 ENCOUNTER — Ambulatory Visit: Payer: 59 | Attending: Sports Medicine | Admitting: Physical Therapy

## 2023-02-04 DIAGNOSIS — M542 Cervicalgia: Secondary | ICD-10-CM | POA: Diagnosis not present

## 2023-02-04 DIAGNOSIS — M6281 Muscle weakness (generalized): Secondary | ICD-10-CM

## 2023-02-04 DIAGNOSIS — R293 Abnormal posture: Secondary | ICD-10-CM | POA: Insufficient documentation

## 2023-02-04 NOTE — Therapy (Signed)
OUTPATIENT PHYSICAL THERAPY EVALUATION   Patient Name: Jon Rodriguez MRN: [id number] DOB:[birth year], 62 y.o., male Today's Date: 02/04/2023   END OF SESSION:  PT End of Session - 02/04/23 1029     Visit Number 1    Number of Visits 9    Date for PT Re-Evaluation 04/01/23    Authorization Type [employer]    PT Start Time 1015    PT Stop Time 1100    PT Time Calculation (min) 45 min    Activity Tolerance Patient tolerated treatment well    Behavior During Therapy [facility] for tasks assessed/performed             Past Medical History:  Diagnosis Date   High cholesterol    Hypertension    S/P AVR    Type A aortic disection    S/p emergent surgical repair in 2017 subsequent extensive aneurysm of the ascending aorta, aortic arch, and descending aorta requiring a two stage surgical repair with AVR in 12/2018 at [facility]    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   Past Surgical History:  Procedure Laterality Date   AORTIC VALVE REPLACEMENT     carotid endartarectomy      COLONOSCOPY  2012   KNEE ARTHROSCOPY WITH MEDIAL MENISECTOMY Left 07/20/2022   Procedure: KNEE ARTHROSCOPY WITH PARTIAL MEDIAL MENISECTOMY;  Surgeon: Yolonda Kida, MD;  Location: [facility];  Service: Orthopedics;  Laterality: Left;  75   KNEE ARTHROSCOPY WITH SUBCHONDROPLASTY Left 07/20/2022   Procedure: KNEE ARTHROSCOPY WITH MEDIAL TIBIA SUBCHONDROPLASTY;  Surgeon: Yolonda Kida, MD;  Location: [facility];  Service: Orthopedics;  Laterality: Left;  75   REPLACEMENT ASCENDING AORTA N/A 03/31/2016   Procedure: REPLACEMENT OF ASCENDING AORTA USING A 30mm HEMASHIELD PLATINUM VASCULAR GRAFT;  Surgeon: Kerin Perna, MD;  Location: [facility] OR;  Service: Open Heart Surgery;  Laterality: N/A;   stent placed in abdomen      subclavian stenosis      Patient Active Problem List   Diagnosis Date Noted   S/P AVR (2020, Carpentier-Edwards 29 mm) 05/06/2019   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) 05/06/2019   Overweight (BMI 25.0-29.9) 07/23/2017   Hx of repair of dissecting thoracic aortic aneurysm, Stanford type A 07/23/2017   Hypercholesterolemia 07/18/2016   Acute blood loss anemia    Aortic dissection (HCC)    Debility    Dissection of thoracoabdominal aorta (HCC)    Pyrexia    Obesity    Benign essential HTN    Dysphagia    Aortic dissection, thoracic (HCC) 04/01/2016   AAA (abdominal aortic aneurysm, ruptured) (HCC) 03/31/2016    PCP: Daisy Floro, MD  REFERRING PROVIDER: Christena Deem, MD  REFERRING DIAG: Cervicalgia  THERAPY DIAG:  Cervicalgia  Muscle weakness (generalized)  Abnormal posture  Rationale for Evaluation and Treatment: Rehabilitation  ONSET DATE: 2 months   SUBJECTIVE:  SUBJECTIVE STATEMENT: Patient reports right sided neck, upper trap, and shoulder pain when he introduced shoulder shrugs into his exercise routine. The pain was located to the upper trap region above the scapula and has slowly gotten worse even when he stopped the exercises. He does also get a tingling from the scapular region to the front of his shoulder and to the bicep. Also when he raises his his right arm he gets a resistance when going above shoulder height, and turning his neck to the right will get a tightness on the right side. He did get a muscle relaxer that has helped when taking before bed. He reports the pain will flare up after he has done things all day long. He has also noticed when doing exercises such as bicep curls he will get a pain deep in the bicep.  Hand dominance: Right  PERTINENT HISTORY:  See PMH above  PAIN:  Are you having pain? Yes:  NPRS scale: 3-4/10, 7/10 at worst Pain location: Right neck, upper trap, shoulder Pain  description: Numbing sensation, tingling Aggravating factors: Activity, raising right arm, turning neck to the left Relieving factors: Heating pad  PRECAUTIONS: None  WEIGHT BEARING RESTRICTIONS: No  FALLS:  Has patient fallen in last 6 months? No  PLOF: Independent  PATIENT GOALS: Pain relief and get back to exercises   OBJECTIVE:  PATIENT SURVEYS:  FOTO 58% functional status  COGNITION: Overall cognitive status: Within functional limits for tasks assessed  SENSATION: WFL  POSTURE:   Rounded shoulder and forward head posture  PALPATION: Tender to palpation right upper trap, levator scap, infraspinatus, proximal bicep and LH bicep tendon   CERVICAL ROM:   Active ROM A/PROM (deg) eval  Flexion 55  Extension 25  Right lateral flexion 15  Left lateral flexion 15  Right rotation 50  Left rotation 65   (Blank rows = not tested)  UPPER EXTREMITY ROM:   Grossly WFL, more difficulty and pain on right  UPPER EXTREMITY MMT:  MMT Right eval Left eval  Shoulder flexion 4 5  Shoulder extension    Shoulder abduction 4 5  Shoulder adduction    Shoulder extension    Shoulder internal rotation 5 5  Shoulder external rotation 4 5  Middle trapezius    Lower trapezius    Elbow flexion 4+ 5  Elbow extension 5 5  Wrist flexion    Wrist extension    Wrist ulnar deviation    Wrist radial deviation    Wrist pronation    Wrist supination    Grip strength     (Blank rows = not tested)  CERVICAL SPECIAL TESTS:  Spurling's test: Positive on right  FUNCTIONAL TESTS:  DNF endurance: 8 seconds   TODAY'S TREATMENT:    [facility] Adult PT Treatment:                                                DATE: 02/04/2023 Therapeutic Exercise: Supine chin tuck 5 x 5 sec Seated upper trap and levator scap stretch x 15 sec each Instruction on using tennis ball for SMFR Trigger Point Dry Needling Treatment: Pre-treatment instruction: Patient instructed on dry needling rationale,  procedures, and possible side effects including pain during treatment (achy,cramping feeling), bruising, drop of blood, lightheadedness, nausea, sweating. Patient Consent Given: Yes Education handout provided: No Muscles treated: Right upper trap, infraspinatus,  levator scap  Needle size and number: .30x1mm x 4 Electrical stimulation performed: No Parameters: N/A Treatment response/outcome: Twitch response elicited and Palpable decrease in muscle tension Post-treatment instructions: Patient instructed to expect possible mild to moderate muscle soreness later today and/or tomorrow. Patient instructed in methods to reduce muscle soreness and to continue prescribed HEP. If patient was dry needled over the lung field, patient was instructed on signs and symptoms of pneumothorax and, however unlikely, to see immediate medical attention should they occur. Patient was also educated on signs and symptoms of infection and to seek medical attention should they occur. Patient verbalized understanding of these instructions and education.  PATIENT EDUCATION:  Education details: Exam findings, POC, HEP, TPDN Person educated: Patient Education method: Explanation, Demonstration, Tactile cues, Verbal cues, and Handouts Education comprehension: verbalized understanding, returned demonstration, verbal cues required, tactile cues required, and needs further education  HOME EXERCISE PROGRAM: Access Code: [email or web address]    ASSESSMENT: CLINICAL IMPRESSION: Patient is a 62 y.o. male who was seen today for physical therapy evaluation and treatment for chronic right sided neck, upper trap, and shoulder pain and tingling that does seem to have a muscular and radicular component. He exhibits gross strength deficit of the right shoulder and postural deviations including forward head and rounded shoulders. He did report tenderness with palpation of right neck and shoulder region with reproduction of right shoulder and arm  symptoms.    OBJECTIVE IMPAIRMENTS: decreased activity tolerance, decreased ROM, decreased strength, impaired flexibility, postural dysfunction, and pain.   ACTIVITY LIMITATIONS: lifting, reach over head, and hygiene/grooming  PARTICIPATION LIMITATIONS: community activity  PERSONAL FACTORS: Fitness, Past/current experiences, and Time since onset of injury/illness/exacerbation are also affecting patient's functional outcome.   REHAB POTENTIAL: Good  CLINICAL DECISION MAKING: Stable/uncomplicated  EVALUATION COMPLEXITY: Low   GOALS: Goals reviewed with patient? Yes  SHORT TERM GOALS: Target date: 03/04/2023  Patient will be I with initial HEP in order to progress with therapy. Baseline: HEP provided at eval Goal status:   2.  Patient will demonstrate right cervical rotation >/= 65 deg in order to improve driving Baseline: 50 deg Goal status: INITIAL  3.  Patient will report right neck/shoulder pain at worst </= 5/10 in order to reduce functional limitations Baseline: 7/10 Goal status: INITIAL  LONG TERM GOALS: Target date: 04/01/2023  Patient will be I with final HEP to maintain progress from PT. Baseline: HEP provided at eval Goal status: INITIAL  2.  Patient will report >/= 70% status on FOTO to indicate improved functional ability. Baseline: 58% functional status Goal status: INITIAL  3.  Patient will demonstrate right shoulder strength 5/5 MMT in order to return to exercise without limitation Baseline: grossly 4/5 MMT Goal status: INITIAL  4.  Patient will report no pain or difficulty with right shoulder motion to improve grooming ability Baseline: patient reports difficulty and pain with right shoulder motion Goal status: INITIAL   PLAN: PT FREQUENCY: 1-2x/week  PT DURATION: 8 weeks  PLANNED INTERVENTIONS: Therapeutic exercises, Therapeutic activity, Neuromuscular re-education, Balance training, Gait training, Patient/Family education, Self Care, Joint  mobilization, Joint manipulation, Dry Needling, Electrical stimulation, Spinal manipulation, Spinal mobilization, Moist heat, Manual therapy, and Re-evaluation  PLAN FOR NEXT SESSION: Review HEP and progress PRN, manua/TPDN for cervical and shoulder region, postural training and right shoulder strengthening   Rosana Hoes, PT, DPT, LAT, ATC 02/04/23  12:56 PM Phone: [phone] Fax: [phone]

## 2023-02-04 NOTE — Patient Instructions (Signed)
Access Code: [email or web address] URL: [email or web address] Date: 02/04/2023 Prepared by: Rosana Hoes  Exercises - Supine Cervical Retraction with Towel  - 2 x daily - 10 reps - 5 seconds hold - Seated Cervical Sidebending Stretch  - 2 x daily - 3 reps - 15 seconds hold - Seated Levator Scapulae Stretch  - 2 x daily - 3 reps - 15 seconds hold - Standing Upper Trapezius Mobilization with Small Newman Pies

## 2023-02-11 NOTE — Therapy (Signed)
OUTPATIENT PHYSICAL THERAPY TREATMENT NOTE   Patient Name: Jon Rodriguez MRN: [id number] DOB:03/09/1961, 62 y.o., male Today's Date: 02/13/2023  PCP: Daisy Floro, MD REFERRING PROVIDER: Christena Deem, MD   END OF SESSION:   PT End of Session - 02/13/23 1027     Visit Number 2    Number of Visits 9    Date for PT Re-Evaluation 04/01/23    Authorization Type [employer]et[employer]    PT Start Time 1020    PT Stop Time 1100    PT Time Calculation (min) 40 min    Activity Tolerance Patient tolerated treatment well    Behavior During Therapy [facility] for tasks assessed/performed             Past Medical History:  Diagnosis Date   High cholesterol    Hypertension    S/P AVR    Type A aortic disection    S/p emergent surgical repair in 2017 subsequent extensive aneurysm of the ascending aorta, aortic arch, and descending aorta requiring a two stage surgical repair with AVR in 12/2018 at [facility]    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   Past Surgical History:  Procedure Laterality Date   AORTIC VALVE REPLACEMENT     carotid endartarectomy      COLONOSCOPY  2012   KNEE ARTHROSCOPY WITH MEDIAL MENISECTOMY Left 07/20/2022   Procedure: KNEE ARTHROSCOPY WITH PARTIAL MEDIAL MENISECTOMY;  Surgeon: Yolonda Kida, MD;  Location: [facility];  Service: Orthopedics;  Laterality: Left;  75   KNEE ARTHROSCOPY WITH SUBCHONDROPLASTY Left 07/20/2022   Procedure: KNEE ARTHROSCOPY WITH MEDIAL TIBIA SUBCHONDROPLASTY;  Surgeon: Yolonda Kida, MD;  Location: [facility];  Service: Orthopedics;  Laterality: Left;  75   REPLACEMENT ASCENDING AORTA N/A 03/31/2016   Procedure: REPLACEMENT OF ASCENDING AORTA USING A 30mm HEMASHIELD PLATINUM VASCULAR GRAFT;  Surgeon: Kerin Perna, MD;  Location: [facility] OR;  Service: Open Heart Surgery;  Laterality: N/A;   stent placed in abdomen      subclavian stenosis      Patient Active Problem List   Diagnosis Date Noted   S/P AVR (2020,  Carpentier-Edwards 29 mm) 05/06/2019   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) 05/06/2019   Overweight (BMI 25.0-29.9) 07/23/2017   Hx of repair of dissecting thoracic aortic aneurysm, Stanford type A 07/23/2017   Hypercholesterolemia 07/18/2016   Acute blood loss anemia    Aortic dissection (HCC)    Debility    Dissection of thoracoabdominal aorta (HCC)    Pyrexia    Obesity    Benign essential HTN    Dysphagia    Aortic dissection, thoracic (HCC) 04/01/2016   AAA (abdominal aortic aneurysm, ruptured) (HCC) 03/31/2016    REFERRING DIAG:  Cervicalgia   THERAPY DIAG:  Cervicalgia  Muscle weakness (generalized)  Abnormal posture  Rationale for Evaluation and Treatment Rehabilitation  PERTINENT HISTORY: See PMH above   PRECAUTIONS: None    SUBJECTIVE:  SUBJECTIVE STATEMENT:  Patient reports he is feeling better. He does report he continues to have the difficulty bringing his right hand to his head / face but the pain has dropped.   PAIN:  Are you having pain? Yes:  NPRS scale: 3/10, 5-6/10 at worst Pain location: Right neck, upper trap, shoulder Pain description: Numbing sensation, tingling Aggravating factors: Activity, raising right arm, turning neck to the left Relieving factors: Heating pad   OBJECTIVE: (objective measures completed at initial evaluation unless otherwise dated) PATIENT SURVEYS:  FOTO 58% functional status   POSTURE:             Rounded shoulder and forward head posture   PALPATION: Tender to palpation right upper trap, levator scap, infraspinatus, proximal bicep and LH bicep tendon          CERVICAL ROM:    Active ROM A/PROM (deg) eval  Flexion 55  Extension 25  Right lateral flexion 15  Left lateral flexion 15  Right rotation 50  Left  rotation 65   (Blank rows = not tested)   UPPER EXTREMITY ROM:                       Grossly WFL, more difficulty and pain on right   UPPER EXTREMITY MMT:   MMT Right eval Left eval Rt 02/13/23  Shoulder flexion 4 5 4   Shoulder extension       Shoulder abduction 4 5   Shoulder adduction       Shoulder extension       Shoulder internal rotation 5 5   Shoulder external rotation 4 5 4   Middle trapezius       Lower trapezius       Elbow flexion 4+ 5   Elbow extension 5 5   Wrist flexion       Wrist extension       Wrist ulnar deviation       Wrist radial deviation       Wrist pronation       Wrist supination       Grip strength        (Blank rows = not tested)   CERVICAL SPECIAL TESTS:  Spurling's test: Positive on right   FUNCTIONAL TESTS:  DNF endurance: 8 seconds     TODAY'S TREATMENT:    [facility] Adult PT Treatment:                                                DATE: 02/13/2023 Therapeutic Exercise: UBE L3 x 4 min while taking subjective Sidelying thoracic rotation x 10 each Supine chin tuck 10 x 5 sec Seated upper trap and levator scap stretch x 15 sec each [place] ER and scap retraction with green 2 x 10 Sidelying ER with 3# 2 x 10 Row with blue 2 x 10 Manual: Skilled palpation and monitoring of muscle tension while performing TPDN STM right upper trap and posterior shoulder region Trigger Point Dry Needling Treatment: Pre-treatment instruction: Patient instructed on dry needling rationale, procedures, and possible side effects including pain during treatment (achy,cramping feeling), bruising, drop of blood, lightheadedness, nausea, sweating. Patient Consent Given: Yes Education handout provided: No Muscles treated: Right upper trap, infraspinatus, levator scap, rhomboid  Needle size and number: .30x21mm x 7 Electrical stimulation performed: No Parameters: N/A Treatment response/outcome: Twitch response  elicited and Palpable decrease in muscle  tension Post-treatment instructions: Patient instructed to expect possible mild to moderate muscle soreness later today and/or tomorrow. Patient instructed in methods to reduce muscle soreness and to continue prescribed HEP. If patient was dry needled over the lung field, patient was instructed on signs and symptoms of pneumothorax and, however unlikely, to see immediate medical attention should they occur. Patient was also educated on signs and symptoms of infection and to seek medical attention should they occur. Patient verbalized understanding of these instructions and education.   [facility] Adult PT Treatment:                                                DATE: 02/04/2023 Therapeutic Exercise: Supine chin tuck 5 x 5 sec Seated upper trap and levator scap stretch x 15 sec each Instruction on using tennis ball for SMFR Trigger Point Dry Needling Treatment: Pre-treatment instruction: Patient instructed on dry needling rationale, procedures, and possible side effects including pain during treatment (achy,cramping feeling), bruising, drop of blood, lightheadedness, nausea, sweating. Patient Consent Given: Yes Education handout provided: No Muscles treated: Right upper trap, infraspinatus, levator scap  Needle size and number: .30x19mm x 4 Electrical stimulation performed: No Parameters: N/A Treatment response/outcome: Twitch response elicited and Palpable decrease in muscle tension Post-treatment instructions: Patient instructed to expect possible mild to moderate muscle soreness later today and/or tomorrow. Patient instructed in methods to reduce muscle soreness and to continue prescribed HEP. If patient was dry needled over the lung field, patient was instructed on signs and symptoms of pneumothorax and, however unlikely, to see immediate medical attention should they occur. Patient was also educated on signs and symptoms of infection and to seek medical attention should they occur. Patient verbalized  understanding of these instructions and education.   PATIENT EDUCATION:  Education details: HEP update, TPDN Person educated: Patient Education method: Explanation, Demonstration, Tactile cues, Verbal cues, and Handouts Education comprehension: verbalized understanding, returned demonstration, verbal cues required, tactile cues required, and needs further education   HOME EXERCISE PROGRAM: Access Code: [email or web address]      ASSESSMENT: CLINICAL IMPRESSION: Patient tolerated therapy well with no adverse effects. Therapy continued with TPDN for the right neck and posterior shoulder region with good therapeutic benefit. Therapy focused on progressing right rotator cuff and periscapular strength. He does exhibit right rotator cuff strength deficit so updated his HEP to progress strengthening for home. He does report continued difficulty lifting his right hand to his face for activities such as shaving and this is likely due to right rotator cuff strength deficit. Patient would benefit from continued skilled PT to progress his mobility and strength in order to reduce pain and maximize functional ability.     OBJECTIVE IMPAIRMENTS: decreased activity tolerance, decreased ROM, decreased strength, impaired flexibility, postural dysfunction, and pain.    ACTIVITY LIMITATIONS: lifting, reach over head, and hygiene/grooming   PARTICIPATION LIMITATIONS: community activity   PERSONAL FACTORS: Fitness, Past/current experiences, and Time since onset of injury/illness/exacerbation are also affecting patient's functional outcome.      GOALS: Goals reviewed with patient? Yes   SHORT TERM GOALS: Target date: 03/04/2023   Patient will be I with initial HEP in order to progress with therapy. Baseline: HEP provided at eval Goal status:    2.  Patient will demonstrate right cervical rotation >/= 65 deg in order to  improve driving Baseline: 50 deg Goal status: INITIAL   3.  Patient will report right  neck/shoulder pain at worst </= 5/10 in order to reduce functional limitations Baseline: 7/10 Goal status: INITIAL   LONG TERM GOALS: Target date: 04/01/2023   Patient will be I with final HEP to maintain progress from PT. Baseline: HEP provided at eval Goal status: INITIAL   2.  Patient will report >/= 70% status on FOTO to indicate improved functional ability. Baseline: 58% functional status Goal status: INITIAL   3.  Patient will demonstrate right shoulder strength 5/5 MMT in order to return to exercise without limitation Baseline: grossly 4/5 MMT Goal status: INITIAL   4.  Patient will report no pain or difficulty with right shoulder motion to improve grooming ability Baseline: patient reports difficulty and pain with right shoulder motion Goal status: INITIAL     PLAN: PT FREQUENCY: 1-2x/week   PT DURATION: 8 weeks   PLANNED INTERVENTIONS: Therapeutic exercises, Therapeutic activity, Neuromuscular re-education, Balance training, Gait training, Patient/Family education, Self Care, Joint mobilization, Joint manipulation, Dry Needling, Electrical stimulation, Spinal manipulation, Spinal mobilization, Moist heat, Manual therapy, and Re-evaluation   PLAN FOR NEXT SESSION: Review HEP and progress PRN, manua/TPDN for cervical and shoulder region, postural training and right shoulder strengthening   Rosana Hoes, PT, DPT, LAT, ATC 02/13/23  11:24 AM Phone: [phone] Fax: [phone]

## 2023-02-13 ENCOUNTER — Ambulatory Visit: Payer: 59 | Admitting: Physical Therapy

## 2023-02-13 ENCOUNTER — Other Ambulatory Visit: Payer: Self-pay

## 2023-02-13 ENCOUNTER — Encounter: Payer: Self-pay | Admitting: Physical Therapy

## 2023-02-13 DIAGNOSIS — M542 Cervicalgia: Secondary | ICD-10-CM | POA: Diagnosis not present

## 2023-02-13 DIAGNOSIS — M6281 Muscle weakness (generalized): Secondary | ICD-10-CM

## 2023-02-13 DIAGNOSIS — R293 Abnormal posture: Secondary | ICD-10-CM | POA: Diagnosis not present

## 2023-02-13 NOTE — Patient Instructions (Signed)
Access Code: [email or web address] URL: [email or web address] Date: 02/13/2023 Prepared by: Rosana Hoes  Exercises - Supine Cervical Retraction with Towel  - 2 x daily - 10 reps - 5 seconds hold - Seated Cervical Sidebending Stretch  - 2 x daily - 3 reps - 15 seconds hold - Seated Levator Scapulae Stretch  - 2 x daily - 3 reps - 15 seconds hold - Standing Upper Trapezius Mobilization with Small Ball  - Sidelying Shoulder ER with Towel and Dumbbell  - 1 x daily - 3 sets - 10 reps - Standing Row with Anchored Resistance  - 1 x daily - 3 sets - 10 reps

## 2023-02-21 NOTE — Therapy (Signed)
OUTPATIENT PHYSICAL THERAPY TREATMENT NOTE   Patient Name: Jon Rodriguez MRN: [id number] DOB:05-22-1961, 62 y.o., male Today's Date: 02/22/2023  PCP: Daisy Floro, MD REFERRING PROVIDER: Christena Deem, MD   END OF SESSION:   PT End of Session - 02/22/23 0925     Visit Number 3    Number of Visits 9    Date for PT Re-Evaluation 04/01/23    Authorization Type [employer]    PT Start Time 0930    PT Stop Time 1015    PT Time Calculation (min) 45 min    Activity Tolerance Patient tolerated treatment well    Behavior During Therapy [facility] for tasks assessed/performed              Past Medical History:  Diagnosis Date   High cholesterol    Hypertension    S/P AVR    Type A aortic disection    S/p emergent surgical repair in 2017 subsequent extensive aneurysm of the ascending aorta, aortic arch, and descending aorta requiring a two stage surgical repair with AVR in 12/2018 at [facility]    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   Past Surgical History:  Procedure Laterality Date   AORTIC VALVE REPLACEMENT     carotid endartarectomy      COLONOSCOPY  2012   KNEE ARTHROSCOPY WITH MEDIAL MENISECTOMY Left 07/20/2022   Procedure: KNEE ARTHROSCOPY WITH PARTIAL MEDIAL MENISECTOMY;  Surgeon: Yolonda Kida, MD;  Location: [facility];  Service: Orthopedics;  Laterality: Left;  75   KNEE ARTHROSCOPY WITH SUBCHONDROPLASTY Left 07/20/2022   Procedure: KNEE ARTHROSCOPY WITH MEDIAL TIBIA SUBCHONDROPLASTY;  Surgeon: Yolonda Kida, MD;  Location: [facility];  Service: Orthopedics;  Laterality: Left;  75   REPLACEMENT ASCENDING AORTA N/A 03/31/2016   Procedure: REPLACEMENT OF ASCENDING AORTA USING A 30mm HEMASHIELD PLATINUM VASCULAR GRAFT;  Surgeon: Kerin Perna, MD;  Location: [facility] OR;  Service: Open Heart Surgery;  Laterality: N/A;   stent placed in abdomen      subclavian stenosis      Patient Active Problem List   Diagnosis Date Noted   S/P AVR (2020,  Carpentier-Edwards 29 mm) 05/06/2019   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) 05/06/2019   Overweight (BMI 25.0-29.9) 07/23/2017   Hx of repair of dissecting thoracic aortic aneurysm, Stanford type A 07/23/2017   Hypercholesterolemia 07/18/2016   Acute blood loss anemia    Aortic dissection (HCC)    Debility    Dissection of thoracoabdominal aorta (HCC)    Pyrexia    Obesity    Benign essential HTN    Dysphagia    Aortic dissection, thoracic (HCC) 04/01/2016   AAA (abdominal aortic aneurysm, ruptured) (HCC) 03/31/2016    REFERRING DIAG:  Cervicalgia   THERAPY DIAG:  Cervicalgia  Muscle weakness (generalized)  Abnormal posture  Rationale for Evaluation and Treatment Rehabilitation  PERTINENT HISTORY: See PMH above   PRECAUTIONS: None    SUBJECTIVE:  SUBJECTIVE STATEMENT:  Patient reports he is doing better. He thinks he only had one flare up this past week. He is getting less numbing and pain, but does continue to have the strength deficits of the right shoulder.  PAIN:  Are you having pain? Yes:  NPRS scale: 3/10, 5/10 at worst Pain location: Right neck, upper trap, shoulder Pain description: Numbing sensation, tingling Aggravating factors: Activity, raising right arm, turning neck to the left Relieving factors: Heating pad   OBJECTIVE: (objective measures completed at initial evaluation unless otherwise dated) PATIENT SURVEYS:  FOTO 58% functional status   POSTURE:             Rounded shoulder and forward head posture   PALPATION: Tender to palpation right upper trap, levator scap, infraspinatus, proximal bicep and LH bicep tendon          CERVICAL ROM:    Active ROM A/PROM (deg) eval   02/22/23  Flexion 55   Extension 25   Right lateral flexion 15   Left  lateral flexion 15   Right rotation 50 55  Left rotation 65    (Blank rows = not tested)   UPPER EXTREMITY ROM:                       Grossly WFL, more difficulty and pain on right   UPPER EXTREMITY MMT:   MMT Right eval Left eval Rt 02/13/23  Shoulder flexion 4 5 4   Shoulder extension       Shoulder abduction 4 5   Shoulder adduction       Shoulder extension       Shoulder internal rotation 5 5   Shoulder external rotation 4 5 4   Middle trapezius       Lower trapezius       Elbow flexion 4+ 5   Elbow extension 5 5   Wrist flexion       Wrist extension       Wrist ulnar deviation       Wrist radial deviation       Wrist pronation       Wrist supination       Grip strength        (Blank rows = not tested)   CERVICAL SPECIAL TESTS:  Spurling's test: Positive on right   FUNCTIONAL TESTS:  DNF endurance: 8 seconds     TODAY'S TREATMENT:    [facility] Adult PT Treatment:                                                DATE: 02/22/2023 Therapeutic Exercise: UBE L3 x 4 min while taking subjective Sidelying thoracic rotation x 10 each Sidelying shoulder abduction with 3# 2 x 15 Sidelying ER with 3# 2 x 15 Sidelying shoulder flexion with 3# 2 x 10 Sidelying horizontal abduction with 3# 2 x 10 Supine serratus punch with 3# 2 x 15 Supine horizontal abduction with green 2 x 10 Prone row with 3# 2 x 15 Prone shoulder extension with 3# 2 x 10 Serratus wall slide with FR x 10 Manual: Skilled palpation and monitoring of muscle tension while performing TPDN STM right upper trap and posterior shoulder region Trigger Point Dry Needling Treatment: Pre-treatment instruction: Patient instructed on dry needling rationale, procedures, and possible side effects including pain during treatment (achy,cramping  feeling), bruising, drop of blood, lightheadedness, nausea, sweating. Patient Consent Given: Yes Education handout provided: No Muscles treated: Right upper trap, infraspinatus,  rhomboid  Needle size and number: .30x34mm x 7 Electrical stimulation performed: No Parameters: N/A Treatment response/outcome: Twitch response elicited and Palpable decrease in muscle tension Post-treatment instructions: Patient instructed to expect possible mild to moderate muscle soreness later today and/or tomorrow. Patient instructed in methods to reduce muscle soreness and to continue prescribed HEP. If patient was dry needled over the lung field, patient was instructed on signs and symptoms of pneumothorax and, however unlikely, to see immediate medical attention should they occur. Patient was also educated on signs and symptoms of infection and to seek medical attention should they occur. Patient verbalized understanding of these instructions and education.   [facility] Adult PT Treatment:                                                DATE: 02/13/2023 Therapeutic Exercise: UBE L3 x 4 min while taking subjective Sidelying thoracic rotation x 10 each Supine chin tuck 10 x 5 sec Seated upper trap and levator scap stretch x 15 sec each [place] ER and scap retraction with green 2 x 10 Sidelying ER with 3# 2 x 10 Row with blue 2 x 10 Manual: Skilled palpation and monitoring of muscle tension while performing TPDN STM right upper trap and posterior shoulder region Trigger Point Dry Needling Treatment: Pre-treatment instruction: Patient instructed on dry needling rationale, procedures, and possible side effects including pain during treatment (achy,cramping feeling), bruising, drop of blood, lightheadedness, nausea, sweating. Patient Consent Given: Yes Education handout provided: No Muscles treated: Right upper trap, infraspinatus, levator scap, rhomboid  Needle size and number: .30x5mm x 7 Electrical stimulation performed: No Parameters: N/A Treatment response/outcome: Twitch response elicited and Palpable decrease in muscle tension Post-treatment instructions: Patient instructed to expect  possible mild to moderate muscle soreness later today and/or tomorrow. Patient instructed in methods to reduce muscle soreness and to continue prescribed HEP. If patient was dry needled over the lung field, patient was instructed on signs and symptoms of pneumothorax and, however unlikely, to see immediate medical attention should they occur. Patient was also educated on signs and symptoms of infection and to seek medical attention should they occur. Patient verbalized understanding of these instructions and education.  [facility] Adult PT Treatment:                                                DATE: 02/04/2023 Therapeutic Exercise: Supine chin tuck 5 x 5 sec Seated upper trap and levator scap stretch x 15 sec each Instruction on using tennis ball for SMFR Trigger Point Dry Needling Treatment: Pre-treatment instruction: Patient instructed on dry needling rationale, procedures, and possible side effects including pain during treatment (achy,cramping feeling), bruising, drop of blood, lightheadedness, nausea, sweating. Patient Consent Given: Yes Education handout provided: No Muscles treated: Right upper trap, infraspinatus, levator scap  Needle size and number: .30x49mm x 4 Electrical stimulation performed: No Parameters: N/A Treatment response/outcome: Twitch response elicited and Palpable decrease in muscle tension Post-treatment instructions: Patient instructed to expect possible mild to moderate muscle soreness later today and/or tomorrow. Patient instructed in methods to reduce muscle soreness and  to continue prescribed HEP. If patient was dry needled over the lung field, patient was instructed on signs and symptoms of pneumothorax and, however unlikely, to see immediate medical attention should they occur. Patient was also educated on signs and symptoms of infection and to seek medical attention should they occur. Patient verbalized understanding of these instructions and education.   PATIENT  EDUCATION:  Education details: HEP update, TPDN Person educated: Patient Education method: Explanation, Demonstration, Tactile cues, Verbal cues, and Handouts Education comprehension: verbalized understanding, returned demonstration, verbal cues required, tactile cues required, and needs further education   HOME EXERCISE PROGRAM: Access Code: [email or web address]      ASSESSMENT: CLINICAL IMPRESSION: Patient tolerated therapy well with no adverse effects. Continued with TPDN this visit for the upper trap and posterior shoulder region with good therapeutic benefit. Therapy focused primarily on progressing right rotator cuff and periscapular strengthening with good tolerance. He does continue to exhibit gross strength limitations of the right rotator cuff, his cervical rotation to the right has improved but does continue to report tightness and numbness sensation at end range. Symptoms continue to seem to be consistent with rotator cuff pathology vs cervical radiculopathy leading to motor deficits. Patient would benefit from continued skilled PT to progress his mobility and strength in order to reduce pain and maximize functional ability.     OBJECTIVE IMPAIRMENTS: decreased activity tolerance, decreased ROM, decreased strength, impaired flexibility, postural dysfunction, and pain.    ACTIVITY LIMITATIONS: lifting, reach over head, and hygiene/grooming   PARTICIPATION LIMITATIONS: community activity   PERSONAL FACTORS: Fitness, Past/current experiences, and Time since onset of injury/illness/exacerbation are also affecting patient's functional outcome.      GOALS: Goals reviewed with patient? Yes   SHORT TERM GOALS: Target date: 03/04/2023   Patient will be I with initial HEP in order to progress with therapy. Baseline: HEP provided at eval Goal status: INITIAL   2.  Patient will demonstrate right cervical rotation >/= 65 deg in order to improve driving Baseline: 50 deg Goal status: INITIAL    3.  Patient will report right neck/shoulder pain at worst </= 5/10 in order to reduce functional limitations Baseline: 7/10 Goal status: INITIAL   LONG TERM GOALS: Target date: 04/01/2023   Patient will be I with final HEP to maintain progress from PT. Baseline: HEP provided at eval Goal status: INITIAL   2.  Patient will report >/= 70% status on FOTO to indicate improved functional ability. Baseline: 58% functional status Goal status: INITIAL   3.  Patient will demonstrate right shoulder strength 5/5 MMT in order to return to exercise without limitation Baseline: grossly 4/5 MMT Goal status: INITIAL   4.  Patient will report no pain or difficulty with right shoulder motion to improve grooming ability Baseline: patient reports difficulty and pain with right shoulder motion Goal status: INITIAL     PLAN: PT FREQUENCY: 1-2x/week   PT DURATION: 8 weeks   PLANNED INTERVENTIONS: Therapeutic exercises, Therapeutic activity, Neuromuscular re-education, Balance training, Gait training, Patient/Family education, Self Care, Joint mobilization, Joint manipulation, Dry Needling, Electrical stimulation, Spinal manipulation, Spinal mobilization, Moist heat, Manual therapy, and Re-evaluation   PLAN FOR NEXT SESSION: Review HEP and progress PRN, manua/TPDN for cervical and shoulder region, postural training and right shoulder strengthening   Rosana Hoes, PT, DPT, LAT, ATC 02/22/23  10:20 AM Phone: [phone] Fax: [phone]

## 2023-02-22 ENCOUNTER — Other Ambulatory Visit: Payer: Self-pay

## 2023-02-22 ENCOUNTER — Ambulatory Visit: Payer: 59 | Admitting: Physical Therapy

## 2023-02-22 ENCOUNTER — Encounter: Payer: Self-pay | Admitting: Physical Therapy

## 2023-02-22 DIAGNOSIS — R293 Abnormal posture: Secondary | ICD-10-CM

## 2023-02-22 DIAGNOSIS — M6281 Muscle weakness (generalized): Secondary | ICD-10-CM

## 2023-02-22 DIAGNOSIS — M542 Cervicalgia: Secondary | ICD-10-CM | POA: Diagnosis not present

## 2023-02-25 ENCOUNTER — Other Ambulatory Visit: Payer: Self-pay

## 2023-02-25 ENCOUNTER — Ambulatory Visit: Payer: 59 | Attending: Sports Medicine | Admitting: Physical Therapy

## 2023-02-25 ENCOUNTER — Encounter: Payer: Self-pay | Admitting: Physical Therapy

## 2023-02-25 DIAGNOSIS — M6281 Muscle weakness (generalized): Secondary | ICD-10-CM | POA: Diagnosis not present

## 2023-02-25 DIAGNOSIS — M542 Cervicalgia: Secondary | ICD-10-CM | POA: Diagnosis not present

## 2023-02-25 DIAGNOSIS — R293 Abnormal posture: Secondary | ICD-10-CM | POA: Diagnosis not present

## 2023-02-25 NOTE — Therapy (Signed)
OUTPATIENT PHYSICAL THERAPY TREATMENT NOTE   Patient Name: Jon Rodriguez MRN: [id number] DOB:1961/07/01, 62 y.o., male Today's Date: 02/25/2023  PCP: Daisy Floro, MD REFERRING PROVIDER: Christena Deem, MD   END OF SESSION:   PT End of Session - 02/25/23 1056     Visit Number 4    Number of Visits 9    Date for PT Re-Evaluation 04/01/23    Authorization Type [employer]    PT Start Time 1100    PT Stop Time 1145    PT Time Calculation (min) 45 min    Activity Tolerance Patient tolerated treatment well    Behavior During Therapy [facility] for tasks assessed/performed               Past Medical History:  Diagnosis Date   High cholesterol    Hypertension    S/P AVR    Type A aortic disection    S/p emergent surgical repair in 2017 subsequent extensive aneurysm of the ascending aorta, aortic arch, and descending aorta requiring a two stage surgical repair with AVR in 12/2018 at [facility]    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   Past Surgical History:  Procedure Laterality Date   AORTIC VALVE REPLACEMENT     carotid endartarectomy      COLONOSCOPY  2012   KNEE ARTHROSCOPY WITH MEDIAL MENISECTOMY Left 07/20/2022   Procedure: KNEE ARTHROSCOPY WITH PARTIAL MEDIAL MENISECTOMY;  Surgeon: Yolonda Kida, MD;  Location: [facility]S;  Service: Orthopedics;  Laterality: Left;  75   KNEE ARTHROSCOPY WITH SUBCHONDROPLASTY Left 07/20/2022   Procedure: KNEE ARTHROSCOPY WITH MEDIAL TIBIA SUBCHONDROPLASTY;  Surgeon: Yolonda Kida, MD;  Location: [facility];  Service: Orthopedics;  Laterality: Left;  75   REPLACEMENT ASCENDING AORTA N/A 03/31/2016   Procedure: REPLACEMENT OF ASCENDING AORTA USING A 30mm HEMASHIELD PLATINUM VASCULAR GRAFT;  Surgeon: Kerin Perna, MD;  Location: [facility] OR;  Service: Open Heart Surgery;  Laterality: N/A;   stent placed in abdomen      subclavian stenosis      Patient Active Problem List   Diagnosis Date Noted   S/P AVR (2020,  Carpentier-Edwards 29 mm) 05/06/2019   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) 05/06/2019   Overweight (BMI 25.0-29.9) 07/23/2017   Hx of repair of dissecting thoracic aortic aneurysm, Stanford type A 07/23/2017   Hypercholesterolemia 07/18/2016   Acute blood loss anemia    Aortic dissection (HCC)    Debility    Dissection of thoracoabdominal aorta (HCC)    Pyrexia    Obesity    Benign essential HTN    Dysphagia    Aortic dissection, thoracic (HCC) 04/01/2016   AAA (abdominal aortic aneurysm, ruptured) (HCC) 03/31/2016    REFERRING DIAG:  Cervicalgia   THERAPY DIAG:  Cervicalgia  Muscle weakness (generalized)  Abnormal posture  Rationale for Evaluation and Treatment Rehabilitation  PERTINENT HISTORY: See PMH above   PRECAUTIONS: None    SUBJECTIVE:  SUBJECTIVE STATEMENT:  Patient reports that he has noticed a little relaxation going on in the neck and shoulder, he has noticed an improvement in his ability to bring his right hand to his face the past two days. Currently just having a general tight feeling.  PAIN:  Are you having pain? Yes:  NPRS scale: 1-2/10, 5/10 at worst Pain location: Right neck, upper trap, shoulder Pain description: Tight, numbing sensation, tingling Aggravating factors: Activity, raising right arm, turning neck to the left Relieving factors: Heating pad   OBJECTIVE: (objective measures completed at initial evaluation unless otherwise dated) PATIENT SURVEYS:  FOTO 58% functional status   POSTURE:             Rounded shoulder and forward head posture   PALPATION: Tender to palpation right upper trap, levator scap, infraspinatus, proximal bicep and LH bicep tendon          CERVICAL ROM:    Active ROM A/PROM (deg) eval   02/22/23   02/25/2023   Flexion 55    Extension 25    Right lateral flexion 15    Left lateral flexion 15    Right rotation 50 55 60  Left rotation 65     (Blank rows = not tested)   UPPER EXTREMITY ROM:                       Grossly WFL, more difficulty and pain on right   UPPER EXTREMITY MMT:   MMT Right eval Left eval Rt 02/13/23  Shoulder flexion 4 5 4   Shoulder extension       Shoulder abduction 4 5   Shoulder adduction       Shoulder extension       Shoulder internal rotation 5 5   Shoulder external rotation 4 5 4   Middle trapezius       Lower trapezius       Elbow flexion 4+ 5   Elbow extension 5 5   Wrist flexion       Wrist extension       Wrist ulnar deviation       Wrist radial deviation       Wrist pronation       Wrist supination       Grip strength        (Blank rows = not tested)   CERVICAL SPECIAL TESTS:  Spurling's test: Positive on right   FUNCTIONAL TESTS:  DNF endurance: 8 seconds     TODAY'S TREATMENT:    [facility] Adult PT Treatment:                                                DATE: 02/25/2023 Therapeutic Exercise: UBE L5 x 4 min (fwd/bwd) while taking subjective Supine cervical retraction 10 x 5 sec hold Sidelying thoracic rotation x 10 each Seated cervical rotation SNAG 2 x 10 Sidelying shoulder abduction with 3# 2 x 15 Sidelying ER with 3# 2 x 15 Sidelying horizontal abduction with 3# 2 x 10 Seated shoulder ER propped at 90 deg abd with 3# 2 x 10 Standing scaption to 90 deg with 1# 2 x 10 Manual: Skilled palpation and monitoring of muscle tension while performing TPDN STM right upper trap Suboccipital release with gentle manual traction Passive upper trap and levator scap stretching Passive cervical retraction Trigger  Point Dry Needling Treatment: Pre-treatment instruction: Patient instructed on dry needling rationale, procedures, and possible side effects including pain during treatment (achy,cramping feeling), bruising, drop of blood, lightheadedness,  nausea, sweating. Patient Consent Given: Yes Education handout provided: No Muscles treated: Right upper trap, bilateral suboccipitals, right cervical multifidi, right splenius cervicis Needle size and number: .30x58mm x 5 Electrical stimulation performed: No Parameters: N/A Treatment response/outcome: Twitch response elicited and Palpable decrease in muscle tension Post-treatment instructions: Patient instructed to expect possible mild to moderate muscle soreness later today and/or tomorrow. Patient instructed in methods to reduce muscle soreness and to continue prescribed HEP. If patient was dry needled over the lung field, patient was instructed on signs and symptoms of pneumothorax and, however unlikely, to see immediate medical attention should they occur. Patient was also educated on signs and symptoms of infection and to seek medical attention should they occur. Patient verbalized understanding of these instructions and education.   [facility] Adult PT Treatment:                                                DATE: 02/22/2023 Therapeutic Exercise: UBE L3 x 4 min while taking subjective Sidelying thoracic rotation x 10 each Sidelying shoulder abduction with 3# 2 x 15 Sidelying ER with 3# 2 x 15 Sidelying shoulder flexion with 3# 2 x 10 Sidelying horizontal abduction with 3# 2 x 10 Supine serratus punch with 3# 2 x 15 Supine horizontal abduction with green 2 x 10 Prone row with 3# 2 x 15 Prone shoulder extension with 3# 2 x 10 Serratus wall slide with FR x 10 Manual: Skilled palpation and monitoring of muscle tension while performing TPDN STM right upper trap and posterior shoulder region Trigger Point Dry Needling Treatment: Pre-treatment instruction: Patient instructed on dry needling rationale, procedures, and possible side effects including pain during treatment (achy,cramping feeling), bruising, drop of blood, lightheadedness, nausea, sweating. Patient Consent Given: Yes Education  handout provided: No Muscles treated: Right upper trap, infraspinatus, rhomboid  Needle size and number: .30x36mm x 7 Electrical stimulation performed: No Parameters: N/A Treatment response/outcome: Twitch response elicited and Palpable decrease in muscle tension Post-treatment instructions: Patient instructed to expect possible mild to moderate muscle soreness later today and/or tomorrow. Patient instructed in methods to reduce muscle soreness and to continue prescribed HEP. If patient was dry needled over the lung field, patient was instructed on signs and symptoms of pneumothorax and, however unlikely, to see immediate medical attention should they occur. Patient was also educated on signs and symptoms of infection and to seek medical attention should they occur. Patient verbalized understanding of these instructions and education.  [facility] Adult PT Treatment:                                                DATE: 02/13/2023 Therapeutic Exercise: UBE L3 x 4 min while taking subjective Sidelying thoracic rotation x 10 each Supine chin tuck 10 x 5 sec Seated upper trap and levator scap stretch x 15 sec each [place] ER and scap retraction with green 2 x 10 Sidelying ER with 3# 2 x 10 Row with blue 2 x 10 Manual: Skilled palpation and monitoring of muscle tension while performing TPDN STM right upper  trap and posterior shoulder region Trigger Point Dry Needling Treatment: Pre-treatment instruction: Patient instructed on dry needling rationale, procedures, and possible side effects including pain during treatment (achy,cramping feeling), bruising, drop of blood, lightheadedness, nausea, sweating. Patient Consent Given: Yes Education handout provided: No Muscles treated: Right upper trap, infraspinatus, levator scap, rhomboid  Needle size and number: .30x86mm x 7 Electrical stimulation performed: No Parameters: N/A Treatment response/outcome: Twitch response elicited and Palpable decrease in  muscle tension Post-treatment instructions: Patient instructed to expect possible mild to moderate muscle soreness later today and/or tomorrow. Patient instructed in methods to reduce muscle soreness and to continue prescribed HEP. If patient was dry needled over the lung field, patient was instructed on signs and symptoms of pneumothorax and, however unlikely, to see immediate medical attention should they occur. Patient was also educated on signs and symptoms of infection and to seek medical attention should they occur. Patient verbalized understanding of these instructions and education.   PATIENT EDUCATION:  Education details: HEP update, TPDN Person educated: Patient Education method: Explanation, Demonstration, Tactile cues, Verbal cues, and Handouts Education comprehension: verbalized understanding, returned demonstration, verbal cues required, tactile cues required, and needs further education   HOME EXERCISE PROGRAM: Access Code: [email or web address]      ASSESSMENT: CLINICAL IMPRESSION: Patient tolerated therapy well with no adverse effects. Continued with TPDN and incorporated more cervical needling with good therapeutic benefit. Therapy focused on improving cervical mobility and progressing right rotator cuff strengthening. He demonstrates improved cervical motion but continues to demonstrate gross right shoulder strength deficit. Symptoms continue to seem to be consistent with rotator cuff pathology vs cervical radiculopathy leading to motor deficits. Updated HEP to included cervical SNAG stretch to improve right rotation. Patient would benefit from continued skilled PT to progress his mobility and strength in order to reduce pain and maximize functional ability.     OBJECTIVE IMPAIRMENTS: decreased activity tolerance, decreased ROM, decreased strength, impaired flexibility, postural dysfunction, and pain.    ACTIVITY LIMITATIONS: lifting, reach over head, and hygiene/grooming   PARTICIPATION  LIMITATIONS: community activity   PERSONAL FACTORS: Fitness, Past/current experiences, and Time since onset of injury/illness/exacerbation are also affecting patient's functional outcome.      GOALS: Goals reviewed with patient? Yes   SHORT TERM GOALS: Target date: 03/04/2023   Patient will be I with initial HEP in order to progress with therapy. Baseline: HEP provided at eval Goal status: INITIAL   2.  Patient will demonstrate right cervical rotation >/= 65 deg in order to improve driving Baseline: 50 deg Goal status: INITIAL   3.  Patient will report right neck/shoulder pain at worst </= 5/10 in order to reduce functional limitations Baseline: 7/10 Goal status: INITIAL   LONG TERM GOALS: Target date: 04/01/2023   Patient will be I with final HEP to maintain progress from PT. Baseline: HEP provided at eval Goal status: INITIAL   2.  Patient will report >/= 70% status on FOTO to indicate improved functional ability. Baseline: 58% functional status Goal status: INITIAL   3.  Patient will demonstrate right shoulder strength 5/5 MMT in order to return to exercise without limitation Baseline: grossly 4/5 MMT Goal status: INITIAL   4.  Patient will report no pain or difficulty with right shoulder motion to improve grooming ability Baseline: patient reports difficulty and pain with right shoulder motion Goal status: INITIAL     PLAN: PT FREQUENCY: 1-2x/week   PT DURATION: 8 weeks   PLANNED INTERVENTIONS: Therapeutic exercises, Therapeutic activity, Neuromuscular re-education, Balance  training, Gait training, Patient/Family education, Self Care, Joint mobilization, Joint manipulation, Dry Needling, Electrical stimulation, Spinal manipulation, Spinal mobilization, Moist heat, Manual therapy, and Re-evaluation   PLAN FOR NEXT SESSION: Review HEP and progress PRN, manua/TPDN for cervical and shoulder region, postural training and right shoulder strengthening   Rosana Hoes,  PT, DPT, LAT, ATC 02/25/23  11:48 AM Phone: [phone] Fax: [phone]

## 2023-02-25 NOTE — Patient Instructions (Signed)
Access Code: [email or web address] URL: [email or web address] Date: 02/25/2023 Prepared by: Rosana Hoes  Exercises - Supine Cervical Retraction with Towel  - 2 x daily - 10 reps - 5 seconds hold - Seated Cervical Sidebending Stretch  - 2 x daily - 3 reps - 15 seconds hold - Seated Levator Scapulae Stretch  - 2 x daily - 3 reps - 15 seconds hold - Standing Upper Trapezius Mobilization with Small Ball  - Seated Assisted Cervical Rotation with Towel  - 1 x daily - 3 sets - 10 reps - Sidelying Shoulder ER with Towel and Dumbbell  - 1 x daily - 3 sets - 10 reps - Standing Row with Anchored Resistance  - 1 x daily - 3 sets - 10 reps

## 2023-03-01 NOTE — Therapy (Signed)
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   Past Surgical History:  Procedure Laterality Date   AORTIC VALVE REPLACEMENT     carotid endartarectomy      COLONOSCOPY  2012   KNEE ARTHROSCOPY WITH MEDIAL MENISECTOMY Left 07/20/2022   Procedure: KNEE ARTHROSCOPY WITH PARTIAL MEDIAL MENISECTOMY;  Surgeon: Yolonda Kida, MD;  Location: [facility];  Service: Orthopedics;  Laterality: Left;  75   KNEE ARTHROSCOPY WITH SUBCHONDROPLASTY Left 07/20/2022   Procedure: KNEE ARTHROSCOPY WITH MEDIAL TIBIA SUBCHONDROPLASTY;  Surgeon: Yolonda Kida, MD;  Location: [facility];  Service: Orthopedics;  Laterality: Left;  75   REPLACEMENT ASCENDING AORTA N/A 03/31/2016   Procedure: REPLACEMENT OF ASCENDING AORTA USING A 30mm HEMASHIELD PLATINUM VASCULAR GRAFT;  Surgeon: Kerin Perna, MD;  Location: [facility] OR;  Service: Open Heart Surgery;  Laterality: N/A;   stent placed in abdomen      subclavian stenosis      Patient Active Problem List   Diagnosis Date Noted   S/P AVR (2020, Carpentier-Edwards 29 mm) 05/06/2019   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  Rationale for Evaluation and Treatment Rehabilitation  PERTINENT HISTORY: See PMH above   PRECAUTIONS: None    SUBJECTIVE:                                                                                                                                                                                     SUBJECTIVE STATEMENT:  Patient reports that he has noticed a little relaxation going on in the neck and shoulder, he has noticed an improvement in his ability to bring his right hand to his face the past two days. Currently just having a general tight feeling.  PAIN:  Are you having pain? Yes:  NPRS scale: 1-2/10, 5/10 at worst Pain location: Right neck, upper trap, shoulder Pain description: Tight, numbing sensation, tingling Aggravating factors: Activity, raising right arm, turning neck to the left Relieving factors: Heating pad   OBJECTIVE: (objective measures completed at initial evaluation unless otherwise dated) PATIENT SURVEYS:  FOTO 58% functional status   POSTURE:             Rounded shoulder and forward head posture   PALPATION: Tender to palpation right upper trap, levator scap, infraspinatus, proximal bicep and LH bicep tendon          CERVICAL ROM:    Active ROM A/PROM (deg) eval   02/22/23   02/25/2023  Flexion 55    Extension 25    Right lateral flexion 15    Left lateral flexion 15    Right rotation 50 55 60  Left rotation 65     (Blank rows = not tested)   UPPER EXTREMITY ROM:                       Grossly WFL, more difficulty and pain on right   UPPER EXTREMITY MMT:   MMT Right eval Left eval Rt 02/13/23  Shoulder flexion 4 5 4   Shoulder extension       Shoulder abduction 4  5   Shoulder adduction       Shoulder extension       Shoulder internal rotation 5 5   Shoulder external rotation 4 5 4   Middle trapezius       Lower trapezius       Elbow flexion 4+ 5   Elbow extension 5 5   Wrist flexion       Wrist extension       Wrist ulnar deviation       Wrist radial deviation       Wrist pronation       Wrist supination       Grip strength        (Blank rows = not tested)   CERVICAL SPECIAL TESTS:  Spurling's test: Positive on right   FUNCTIONAL TESTS:  DNF endurance: 8 seconds     TODAY'S TREATMENT:    [facility] Adult PT Treatment:                                                DATE: 03/04/2023 Therapeutic Exercise: UBE L5 x 4 min (fwd/bwd) while taking subjective Supine cervical retraction 10 x 5 sec hold Sidelying thoracic rotation x 10 each Seated cervical rotation SNAG 2 x 10 Sidelying shoulder abduction with 3# 2 x 15 Sidelying ER with 3# 2 x 15 Sidelying horizontal abduction with 3# 2 x 10 Seated shoulder ER propped at 90 deg abd with 3# 2 x 10 Standing scaption to 90 deg with 1# 2 x 10 Manual: Skilled palpation and monitoring of muscle tension while performing TPDN STM right upper trap Suboccipital release with gentle manual traction Passive upper trap and levator scap stretching Passive cervical retraction Trigger Point Dry Needling  Treatment: Pre-treatment instruction: Patient instructed on dry needling rationale, procedures, and possible side effects including pain during treatment (achy,cramping feeling), bruising, drop of blood, lightheadedness, nausea, sweating. Patient Consent Given: Yes Education handout provided: No Muscles treated: Right upper trap, bilateral suboccipitals, right cervical multifidi, right splenius cervicis Needle size and number: .30x48mm x 5 Electrical stimulation performed: No Parameters: N/A Treatment response/outcome: Twitch response elicited and Palpable decrease in muscle tension Post-treatment  instructions: Patient instructed to expect possible mild to moderate muscle soreness later today and/or tomorrow. Patient instructed in methods to reduce muscle soreness and to continue prescribed HEP. If patient was dry needled over the lung field, patient was instructed on signs and symptoms of pneumothorax and, however unlikely, to see immediate medical attention should they occur. Patient was also educated on signs and symptoms of infection and to seek medical attention should they occur. Patient verbalized understanding of these instructions and education.   [facility] Adult PT Treatment:                                                DATE: 02/25/2023 Therapeutic Exercise: UBE L5 x 4 min (fwd/bwd) while taking subjective Supine cervical retraction 10 x 5 sec hold Sidelying thoracic rotation x 10 each Seated cervical rotation SNAG 2 x 10 Sidelying shoulder abduction with 3# 2 x 15 Sidelying ER with 3# 2 x 15 Sidelying horizontal abduction with 3# 2 x 10 Seated shoulder ER propped at 90 deg abd with 3# 2 x 10 Standing scaption to 90 deg with 1# 2 x 10 Manual: Skilled palpation and monitoring of muscle tension while performing TPDN STM right upper trap Suboccipital release with gentle manual traction Passive upper trap and levator scap stretching Passive cervical retraction Trigger Point Dry Needling Treatment: Pre-treatment instruction: Patient instructed on dry needling rationale, procedures, and possible side effects including pain during treatment (achy,cramping feeling), bruising, drop of blood, lightheadedness, nausea, sweating. Patient Consent Given: Yes Education handout provided: No Muscles treated: Right upper trap, bilateral suboccipitals, right cervical multifidi, right splenius cervicis Needle size and number: .30x51mm x 5 Electrical stimulation performed: No Parameters: N/A Treatment response/outcome: Twitch response elicited and Palpable decrease in muscle tension Post-treatment  instructions: Patient instructed to expect possible mild to moderate muscle soreness later today and/or tomorrow. Patient instructed in methods to reduce muscle soreness and to continue prescribed HEP. If patient was dry needled over the lung field, patient was instructed on signs and symptoms of pneumothorax and, however unlikely, to see immediate medical attention should they occur. Patient was also educated on signs and symptoms of infection and to seek medical attention should they occur. Patient verbalized understanding of these instructions and education.  [facility] Adult PT Treatment:                                                DATE: 02/22/2023 Therapeutic Exercise: UBE L3 x 4 min while taking subjective Sidelying thoracic rotation x 10 each Sidelying shoulder abduction with 3# 2 x 15 Sidelying ER with 3# 2 x 15 Sidelying shoulder flexion with 3# 2 x 10 Sidelying horizontal abduction with 3# 2 x 10 Supine serratus punch with 3# 2 x 15 Supine horizontal abduction with green 2 x 10 Prone  row with 3# 2 x 15 Prone shoulder extension with 3# 2 x 10 Serratus wall slide with FR x 10 Manual: Skilled palpation and monitoring of muscle tension while performing TPDN STM right upper trap and posterior shoulder region Trigger Point Dry Needling Treatment: Pre-treatment instruction: Patient instructed on dry needling rationale, procedures, and possible side effects including pain during treatment (achy,cramping feeling), bruising, drop of blood, lightheadedness, nausea, sweating. Patient Consent Given: Yes Education handout provided: No Muscles treated: Right upper trap, infraspinatus, rhomboid  Needle size and number: .30x20mm x 7 Electrical stimulation performed: No Parameters: N/A Treatment response/outcome: Twitch response elicited and Palpable decrease in muscle tension Post-treatment instructions: Patient instructed to expect possible mild to moderate muscle soreness later today and/or  tomorrow. Patient instructed in methods to reduce muscle soreness and to continue prescribed HEP. If patient was dry needled over the lung field, patient was instructed on signs and symptoms of pneumothorax and, however unlikely, to see immediate medical attention should they occur. Patient was also educated on signs and symptoms of infection and to seek medical attention should they occur. Patient verbalized understanding of these instructions and education.   PATIENT EDUCATION:  Education details: HEP update, TPDN Person educated: Patient Education method: Explanation, Demonstration, Tactile cues, Verbal cues, and Handouts Education comprehension: verbalized understanding, returned demonstration, verbal cues required, tactile cues required, and needs further education   HOME EXERCISE PROGRAM: Access Code: [email or web address]      ASSESSMENT: CLINICAL IMPRESSION: Patient tolerated therapy well with no adverse effects. *** Patient would benefit from continued skilled PT to progress his mobility and strength in order to reduce pain and maximize functional ability.  Continued with TPDN and incorporated more cervical needling with good therapeutic benefit. Therapy focused on improving cervical mobility and progressing right rotator cuff strengthening. He demonstrates improved cervical motion but continues to demonstrate gross right shoulder strength deficit. Symptoms continue to seem to be consistent with rotator cuff pathology vs cervical radiculopathy leading to motor deficits. Updated HEP to included cervical SNAG stretch to improve right rotation.      OBJECTIVE IMPAIRMENTS: decreased activity tolerance, decreased ROM, decreased strength, impaired flexibility, postural dysfunction, and pain.    ACTIVITY LIMITATIONS: lifting, reach over head, and hygiene/grooming   PARTICIPATION LIMITATIONS: community activity   PERSONAL FACTORS: Fitness, Past/current experiences, and Time since onset of  injury/illness/exacerbation are also affecting patient's functional outcome.      GOALS: Goals reviewed with patient? Yes   SHORT TERM GOALS: Target date: 03/04/2023   Patient will be I with initial HEP in order to progress with therapy. Baseline: HEP provided at eval Goal status: INITIAL   2.  Patient will demonstrate right cervical rotation >/= 65 deg in order to improve driving Baseline: 50 deg Goal status: INITIAL   3.  Patient will report right neck/shoulder pain at worst </= 5/10 in order to reduce functional limitations Baseline: 7/10 Goal status: INITIAL   LONG TERM GOALS: Target date: 04/01/2023   Patient will be I with final HEP to maintain progress from PT. Baseline: HEP provided at eval Goal status: INITIAL   2.  Patient will report >/= 70% status on FOTO to indicate improved functional ability. Baseline: 58% functional status Goal status: INITIAL   3.  Patient will demonstrate right shoulder strength 5/5 MMT in order to return to exercise without limitation Baseline: grossly 4/5 MMT Goal status: INITIAL   4.  Patient will report no pain or difficulty with right shoulder motion to improve grooming ability Baseline: patient  reports difficulty and pain with right shoulder motion Goal status: INITIAL     PLAN: PT FREQUENCY: 1-2x/week   PT DURATION: 8 weeks   PLANNED INTERVENTIONS: Therapeutic exercises, Therapeutic activity, Neuromuscular re-education, Balance training, Gait training, Patient/Family education, Self Care, Joint mobilization, Joint manipulation, Dry Needling, Electrical stimulation, Spinal manipulation, Spinal mobilization, Moist heat, Manual therapy, and Re-evaluation   PLAN FOR NEXT SESSION: Review HEP and progress PRN, manua/TPDN for cervical and shoulder region, postural training and right shoulder strengthening   Rosana Hoes, PT, DPT, LAT, ATC 03/01/23  8:51 AM Phone: [phone] Fax: [phone]

## 2023-03-04 ENCOUNTER — Encounter: Payer: Self-pay | Admitting: Physical Therapy

## 2023-03-04 ENCOUNTER — Ambulatory Visit: Payer: 59 | Admitting: Physical Therapy

## 2023-03-04 ENCOUNTER — Other Ambulatory Visit: Payer: Self-pay

## 2023-03-04 DIAGNOSIS — M542 Cervicalgia: Secondary | ICD-10-CM | POA: Diagnosis not present

## 2023-03-04 DIAGNOSIS — M6281 Muscle weakness (generalized): Secondary | ICD-10-CM

## 2023-03-04 DIAGNOSIS — R293 Abnormal posture: Secondary | ICD-10-CM

## 2023-03-04 NOTE — Patient Instructions (Signed)
Access Code: [email or web address] URL: [email or web address] Date: 03/04/2023 Prepared by: Rosana Hoes  Exercises - Supine Cervical Retraction with Towel  - 2 x daily - 10 reps - 5 seconds hold - Seated Cervical Sidebending Stretch  - 2 x daily - 3 reps - 15 seconds hold - Seated Levator Scapulae Stretch  - 2 x daily - 3 reps - 15 seconds hold - Standing Upper Trapezius Mobilization with Small Ball  - Seated Assisted Cervical Rotation with Towel  - 1 x daily - 3 sets - 10 reps - Supine Shoulder Horizontal Abduction with Resistance  - 1 x daily - 3 sets - 15 reps - Sidelying Shoulder ER with Towel and Dumbbell  - 1 x daily - 3 sets - 15 reps - Standing Row with Anchored Resistance  - 1 x daily - 3 sets - 15 reps

## 2023-03-11 ENCOUNTER — Encounter: Payer: 59 | Admitting: Physical Therapy

## 2023-03-12 DIAGNOSIS — M25511 Pain in right shoulder: Secondary | ICD-10-CM | POA: Diagnosis not present

## 2023-03-18 ENCOUNTER — Encounter: Payer: 59 | Admitting: Physical Therapy

## 2023-03-20 NOTE — Therapy (Signed)
OUTPATIENT PHYSICAL THERAPY TREATMENT NOTE   Patient Name: Jon Rodriguez MRN: [id number] DOB:28-Feb-1961, 62 y.o., male Today's Date: 03/21/2023  PCP: Daisy Floro, MD REFERRING PROVIDER: Christena Deem, MD   END OF SESSION:   PT End of Session - 03/21/23 1150     Visit Number 6    Number of Visits 9    Date for PT Re-Evaluation 04/01/23    Authorization Type [employer]    PT Start Time 1145    PT Stop Time 1230    PT Time Calculation (min) 45 min    Activity Tolerance Patient tolerated treatment well    Behavior During Therapy [facility] for tasks assessed/performed                 Past Medical History:  Diagnosis Date   High cholesterol    Hypertension    S/P AVR    Type A aortic disection    S/p emergent surgical repair in 2017 subsequent extensive aneurysm of the ascending aorta, aortic arch, and descending aorta requiring a two stage surgical repair with AVR in 12/2018 at [facility]    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   Past Surgical History:  Procedure Laterality Date   AORTIC VALVE REPLACEMENT     carotid endartarectomy      COLONOSCOPY  2012   KNEE ARTHROSCOPY WITH MEDIAL MENISECTOMY Left 07/20/2022   Procedure: KNEE ARTHROSCOPY WITH PARTIAL MEDIAL MENISECTOMY;  Surgeon: Yolonda Kida, MD;  Location: [facility];  Service: Orthopedics;  Laterality: Left;  75   KNEE ARTHROSCOPY WITH SUBCHONDROPLASTY Left 07/20/2022   Procedure: KNEE ARTHROSCOPY WITH MEDIAL TIBIA SUBCHONDROPLASTY;  Surgeon: Yolonda Kida, MD;  Location: [facility];  Service: Orthopedics;  Laterality: Left;  75   REPLACEMENT ASCENDING AORTA N/A 03/31/2016   Procedure: REPLACEMENT OF ASCENDING AORTA USING A 30mm HEMASHIELD PLATINUM VASCULAR GRAFT;  Surgeon: Kerin Perna, MD;  Location: [facility] OR;  Service: Open Heart Surgery;  Laterality: N/A;   stent placed in abdomen      subclavian stenosis      Patient Active Problem List   Diagnosis Date Noted   S/P AVR  (2020, Carpentier-Edwards 29 mm) 05/06/2019   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) 05/06/2019   Overweight (BMI 25.0-29.9) 07/23/2017   Hx of repair of dissecting thoracic aortic aneurysm, Stanford type A 07/23/2017   Hypercholesterolemia 07/18/2016   Acute blood loss anemia    Aortic dissection (HCC)    Debility    Dissection of thoracoabdominal aorta (HCC)    Pyrexia    Obesity    Benign essential HTN    Dysphagia    Aortic dissection, thoracic (HCC) 04/01/2016   AAA (abdominal aortic aneurysm, ruptured) (HCC) 03/31/2016    REFERRING DIAG:  Cervicalgia   THERAPY DIAG:  Cervicalgia  Muscle weakness (generalized)  Abnormal posture  Rationale for Evaluation and Treatment Rehabilitation  PERTINENT HISTORY: See PMH above   PRECAUTIONS: None    SUBJECTIVE:  SUBJECTIVE STATEMENT:  Patient feels incremental improvement each week, his shoulder is feeling tight because he drove 12 hours yesterday. He can only remember one episode recently where it was painful. He does have an MRI for his right shoulder on Monday to assess for rotator cuff tear.  PAIN:  Are you having pain? Yes:  NPRS scale: 3/10, 5/10 at worst Pain location: Right neck, upper trap, shoulder Pain description: Tight, numbing sensation, tingling Aggravating factors: Activity, raising right arm, turning neck to the left Relieving factors: Heating pad   OBJECTIVE: (objective measures completed at initial evaluation unless otherwise dated) PATIENT SURVEYS:  FOTO 58% functional status  03/21/2023: 56%   POSTURE:             Rounded shoulder and forward head posture   PALPATION: Tender to palpation right upper trap, levator scap, infraspinatus, proximal bicep and LH bicep tendon          CERVICAL ROM:    Active  ROM A/PROM (deg) eval   02/22/23   02/25/2023  Flexion 55    Extension 25    Right lateral flexion 15    Left lateral flexion 15    Right rotation 50 55 60  Left rotation 65     (Blank rows = not tested)   UPPER EXTREMITY ROM:                       Grossly WFL, more difficulty and pain on right   UPPER EXTREMITY MMT:   MMT Right eval Left eval Rt 02/13/23  Shoulder flexion 4 5 4   Shoulder extension       Shoulder abduction 4 5   Shoulder adduction       Shoulder extension       Shoulder internal rotation 5 5   Shoulder external rotation 4 5 4   Middle trapezius       Lower trapezius       Elbow flexion 4+ 5   Elbow extension 5 5   Wrist flexion       Wrist extension       Wrist ulnar deviation       Wrist radial deviation       Wrist pronation       Wrist supination       Grip strength        (Blank rows = not tested)   CERVICAL SPECIAL TESTS:  Spurling's test: Positive on right   FUNCTIONAL TESTS:  DNF endurance: 8 seconds     TODAY'S TREATMENT:    [facility] Adult PT Treatment:                                                DATE: 03/21/2023 Therapeutic Exercise: UBE L5 x 4 min (fwd/bwd) while taking subjective Sidelying shoulder abduction with 3# 2 x 15 Sidelying ER with 3# 2 x 15 Sidelying horizontal abduction with 3# 2 x 15 Row machine 35# 2 x 15 Extension with blue 2 x 15 Supine shoulder flexion dowel with 3# 2 x 10 Supine serratus punch dowel with 3# 2 x 10 Standing scaption with 2# 2 x 10 Manual: Skilled palpation and monitoring of muscle tension while performing TPDN STM right upper trap Trigger Point Dry Needling Treatment: Pre-treatment instruction: Patient instructed on dry needling rationale, procedures, and possible side effects including  pain during treatment (achy,cramping feeling), bruising, drop of blood, lightheadedness, nausea, sweating. Patient Consent Given: Yes Education handout provided: No Muscles treated: Right upper trap and  infraspinatus Needle size and number: .30x34mm x 5 Electrical stimulation performed: No Parameters: N/A Treatment response/outcome: Twitch response elicited and Palpable decrease in muscle tension Post-treatment instructions: Patient instructed to expect possible mild to moderate muscle soreness later today and/or tomorrow. Patient instructed in methods to reduce muscle soreness and to continue prescribed HEP. If patient was dry needled over the lung field, patient was instructed on signs and symptoms of pneumothorax and, however unlikely, to see immediate medical attention should they occur. Patient was also educated on signs and symptoms of infection and to seek medical attention should they occur. Patient verbalized understanding of these instructions and education.   [facility] Adult PT Treatment:                                                DATE: 03/04/2023 Therapeutic Exercise: UBE L5 x 4 min (fwd/bwd) while taking subjective Supine cervical retraction 10 x 5 sec hold Seated cervical rotation SNAG 2 x 10 to right Row with blue 2 x 20 Doorway pec stretch at 90 deg 2 x 20 sec ER with red 2 x 15 Step back stretch at counter for thoracic extension 5 x 5 sec Supine horizontal abduction with green 2 x 15 Manual: Skilled palpation and monitoring of muscle tension while performing TPDN STM right upper trap Suboccipital release with gentle manual traction Supine lateral cervical glides Passive cervical retraction Trigger Point Dry Needling Treatment: Pre-treatment instruction: Patient instructed on dry needling rationale, procedures, and possible side effects including pain during treatment (achy,cramping feeling), bruising, drop of blood, lightheadedness, nausea, sweating. Patient Consent Given: Yes Education handout provided: No Muscles treated: Right upper trap, suboccipitals, right cervical multifidi, right splenius cervicis, right infraspinatus Needle size and number: .30x97mm x  7 Electrical stimulation performed: No Parameters: N/A Treatment response/outcome: Twitch response elicited and Palpable decrease in muscle tension Post-treatment instructions: Patient instructed to expect possible mild to moderate muscle soreness later today and/or tomorrow. Patient instructed in methods to reduce muscle soreness and to continue prescribed HEP. If patient was dry needled over the lung field, patient was instructed on signs and symptoms of pneumothorax and, however unlikely, to see immediate medical attention should they occur. Patient was also educated on signs and symptoms of infection and to seek medical attention should they occur. Patient verbalized understanding of these instructions and education.  [facility] Adult PT Treatment:                                                DATE: 02/25/2023 Therapeutic Exercise: UBE L5 x 4 min (fwd/bwd) while taking subjective Supine cervical retraction 10 x 5 sec hold Sidelying thoracic rotation x 10 each Seated cervical rotation SNAG 2 x 10 Sidelying shoulder abduction with 3# 2 x 15 Sidelying ER with 3# 2 x 15 Sidelying horizontal abduction with 3# 2 x 10 Seated shoulder ER propped at 90 deg abd with 3# 2 x 10 Standing scaption to 90 deg with 1# 2 x 10 Manual: Skilled palpation and monitoring of muscle tension while performing TPDN STM right upper trap Suboccipital release  with gentle manual traction Passive upper trap and levator scap stretching Passive cervical retraction Trigger Point Dry Needling Treatment: Pre-treatment instruction: Patient instructed on dry needling rationale, procedures, and possible side effects including pain during treatment (achy,cramping feeling), bruising, drop of blood, lightheadedness, nausea, sweating. Patient Consent Given: Yes Education handout provided: No Muscles treated: Right upper trap, bilateral suboccipitals, right cervical multifidi, right splenius cervicis Needle size and number: .30x79mm x  5 Electrical stimulation performed: No Parameters: N/A Treatment response/outcome: Twitch response elicited and Palpable decrease in muscle tension Post-treatment instructions: Patient instructed to expect possible mild to moderate muscle soreness later today and/or tomorrow. Patient instructed in methods to reduce muscle soreness and to continue prescribed HEP. If patient was dry needled over the lung field, patient was instructed on signs and symptoms of pneumothorax and, however unlikely, to see immediate medical attention should they occur. Patient was also educated on signs and symptoms of infection and to seek medical attention should they occur. Patient verbalized understanding of these instructions and education.   PATIENT EDUCATION:  Education details: HEP, TPDN Person educated: Patient Education method: Explanation, Demonstration, Tactile cues, Verbal cues Education comprehension: verbalized understanding, returned demonstration, verbal cues required, tactile cues required, and needs further education   HOME EXERCISE PROGRAM: Access Code: [email or web address]      ASSESSMENT: CLINICAL IMPRESSION: Patient tolerated therapy well with no adverse effects. Therapy continued with TPDN as patient reported tightness from long drive yesterday, and then continued with shoulder and periscapular strengthening. He does continue to demonstrate gross right should strength deficit and shrug with shoulder elevation. Symptoms continue to seem to be consistent with rotator cuff pathology due to shoulder weakness but also possible cervical radiculopathy leading to motor deficits. No changes to HEP this visit. Patient would benefit from continued skilled PT to progress his mobility and strength in order to reduce pain and maximize functional ability.     OBJECTIVE IMPAIRMENTS: decreased activity tolerance, decreased ROM, decreased strength, impaired flexibility, postural dysfunction, and pain.    ACTIVITY  LIMITATIONS: lifting, reach over head, and hygiene/grooming   PARTICIPATION LIMITATIONS: community activity   PERSONAL FACTORS: Fitness, Past/current experiences, and Time since onset of injury/illness/exacerbation are also affecting patient's functional outcome.      GOALS: Goals reviewed with patient? Yes   SHORT TERM GOALS: Target date: 03/04/2023   Patient will be I with initial HEP in order to progress with therapy. Baseline: HEP provided at eval 03/04/2023: independent Goal status: MET   2.  Patient will demonstrate right cervical rotation >/= 65 deg in order to improve driving Baseline: 50 deg 0/98/1191: see limitations above 03/20/2023:  Goal status: ONGOING   3.  Patient will report right neck/shoulder pain at worst </= 5/10 in order to reduce functional limitations Baseline: 7/10 03/04/2023: reports improvement in pain Goal status: MET   LONG TERM GOALS: Target date: 04/01/2023   Patient will be I with final HEP to maintain progress from PT. Baseline: HEP provided at eval Goal status: INITIAL   2.  Patient will report >/= 70% status on FOTO to indicate improved functional ability. Baseline: 58% functional status 03/21/2023: 56% Goal status: INITIAL   3.  Patient will demonstrate right shoulder strength 5/5 MMT in order to return to exercise without limitation Baseline: grossly 4/5 MMT Goal status: INITIAL   4.  Patient will report no pain or difficulty with right shoulder motion to improve grooming ability Baseline: patient reports difficulty and pain with right shoulder motion Goal status: INITIAL  PLAN: PT FREQUENCY: 1-2x/week   PT DURATION: 8 weeks   PLANNED INTERVENTIONS: Therapeutic exercises, Therapeutic activity, Neuromuscular re-education, Balance training, Gait training, Patient/Family education, Self Care, Joint mobilization, Joint manipulation, Dry Needling, Electrical stimulation, Spinal manipulation, Spinal mobilization, Moist heat, Manual  therapy, and Re-evaluation   PLAN FOR NEXT SESSION: Review HEP and progress PRN, manua/TPDN for cervical and shoulder region, postural training and right shoulder strengthening   Rosana Hoes, PT, DPT, LAT, ATC 03/21/23  12:39 PM Phone: [phone] Fax: [phone]

## 2023-03-21 ENCOUNTER — Other Ambulatory Visit: Payer: Self-pay

## 2023-03-21 ENCOUNTER — Encounter: Payer: 59 | Admitting: Physical Therapy

## 2023-03-21 ENCOUNTER — Ambulatory Visit: Payer: 59 | Admitting: Physical Therapy

## 2023-03-21 ENCOUNTER — Encounter: Payer: Self-pay | Admitting: Physical Therapy

## 2023-03-21 DIAGNOSIS — R293 Abnormal posture: Secondary | ICD-10-CM | POA: Diagnosis not present

## 2023-03-21 DIAGNOSIS — M542 Cervicalgia: Secondary | ICD-10-CM

## 2023-03-21 DIAGNOSIS — M6281 Muscle weakness (generalized): Secondary | ICD-10-CM | POA: Diagnosis not present

## 2023-03-25 DIAGNOSIS — M25511 Pain in right shoulder: Secondary | ICD-10-CM | POA: Diagnosis not present

## 2023-03-26 ENCOUNTER — Other Ambulatory Visit: Payer: Self-pay

## 2023-03-26 ENCOUNTER — Ambulatory Visit: Payer: 59 | Attending: Sports Medicine | Admitting: Physical Therapy

## 2023-03-26 ENCOUNTER — Encounter: Payer: Self-pay | Admitting: Physical Therapy

## 2023-03-26 DIAGNOSIS — M542 Cervicalgia: Secondary | ICD-10-CM | POA: Insufficient documentation

## 2023-03-26 DIAGNOSIS — M6281 Muscle weakness (generalized): Secondary | ICD-10-CM | POA: Insufficient documentation

## 2023-03-26 DIAGNOSIS — R293 Abnormal posture: Secondary | ICD-10-CM | POA: Insufficient documentation

## 2023-03-26 NOTE — Therapy (Addendum)
OUTPATIENT PHYSICAL THERAPY TREATMENT NOTE  DISCHARGE   Patient Name: Jon Rodriguez MRN: [id number] DOB:[birth year], 62 y.o., male Today's Date: 03/26/2023  PCP: Daisy Floro, MD REFERRING PROVIDER: Christena Deem, MD   END OF SESSION:   PT End of Session - 03/26/23 1149     Visit Number 7    Number of Visits 11    Date for PT Re-Evaluation 04/23/23    Authorization Type [employer]    PT Start Time 1145    PT Stop Time 1230    PT Time Calculation (min) 45 min    Activity Tolerance Patient tolerated treatment well    Behavior During Therapy [facility] for tasks assessed/performed                  Past Medical History:  Diagnosis Date   High cholesterol    Hypertension    S/P AVR    Type A aortic disection    S/p emergent surgical repair in 2017 subsequent extensive aneurysm of the ascending aorta, aortic arch, and descending aorta requiring a two stage surgical repair with AVR in 12/2018 at [facility]    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   Past Surgical History:  Procedure Laterality Date   AORTIC VALVE REPLACEMENT     carotid endartarectomy      COLONOSCOPY  2012   KNEE ARTHROSCOPY WITH MEDIAL MENISECTOMY Left 07/20/2022   Procedure: KNEE ARTHROSCOPY WITH PARTIAL MEDIAL MENISECTOMY;  Surgeon: Yolonda Kida, MD;  Location: [facility];  Service: Orthopedics;  Laterality: Left;  75   KNEE ARTHROSCOPY WITH SUBCHONDROPLASTY Left 07/20/2022   Procedure: KNEE ARTHROSCOPY WITH MEDIAL TIBIA SUBCHONDROPLASTY;  Surgeon: Yolonda Kida, MD;  Location: [facility];  Service: Orthopedics;  Laterality: Left;  75   REPLACEMENT ASCENDING AORTA N/A 03/31/2016   Procedure: REPLACEMENT OF ASCENDING AORTA USING A 30mm HEMASHIELD PLATINUM VASCULAR GRAFT;  Surgeon: Kerin Perna, MD;  Location: [facility] OR;  Service: Open Heart Surgery;  Laterality: N/A;   stent placed in abdomen      subclavian stenosis      Patient Active Problem List   Diagnosis Date Noted    S/P AVR (2020, Carpentier-Edwards 29 mm) 05/06/2019   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) 05/06/2019   Overweight (BMI 25.0-29.9) 07/23/2017   Hx of repair of dissecting thoracic aortic aneurysm, Stanford type A 07/23/2017   Hypercholesterolemia 07/18/2016   Acute blood loss anemia    Aortic dissection (HCC)    Debility    Dissection of thoracoabdominal aorta (HCC)    Pyrexia    Obesity    Benign essential HTN    Dysphagia    Aortic dissection, thoracic (HCC) 04/01/2016   AAA (abdominal aortic aneurysm, ruptured) (HCC) 03/31/2016    REFERRING DIAG:  Cervicalgia   THERAPY DIAG:  Cervicalgia  Muscle weakness (generalized)  Abnormal posture  Rationale for Evaluation and Treatment Rehabilitation  PERTINENT HISTORY: See PMH above   PRECAUTIONS: None    SUBJECTIVE:  SUBJECTIVE STATEMENT:  Patient reports he was feeling a little tight yesterday, he may have over-did it at the gym. Today is feeling better.   PAIN:  Are you having pain? Yes:  NPRS scale: 0/10, 5/10 at worst Pain location: Right neck, upper trap, shoulder Pain description: Tight, numbing sensation, tingling Aggravating factors: Activity, raising right arm, turning neck to the left Relieving factors: Heating pad   OBJECTIVE: (objective measures completed at initial evaluation unless otherwise dated) PATIENT SURVEYS:  FOTO 58% functional status  03/21/2023: 56%  03/26/2023: 59%   POSTURE:             Rounded shoulder and forward head posture   PALPATION: Tender to palpation right upper trap, levator scap, infraspinatus, proximal bicep and LH bicep tendon          CERVICAL ROM:    Active ROM A/PROM (deg) eval   02/22/23   02/25/2023   03/26/2023  Flexion 55     Extension 25     Right lateral flexion 15      Left lateral flexion 15     Right rotation 50 55 60 65  Left rotation 65   70   (Blank rows = not tested)   UPPER EXTREMITY MMT:   MMT Right eval Left eval Rt 02/13/23 Rt 03/26/2023  Shoulder flexion 4 5 4 4   Shoulder extension        Shoulder abduction 4 5    Shoulder adduction        Shoulder extension        Shoulder internal rotation 5 5    Shoulder external rotation 4 5 4 4   Middle trapezius        Lower trapezius        Elbow flexion 4+ 5    Elbow extension 5 5    Wrist flexion        Wrist extension        Wrist ulnar deviation        Wrist radial deviation        Wrist pronation        Wrist supination        Grip strength         (Blank rows = not tested)   CERVICAL SPECIAL TESTS:  Spurling's test: Positive on right   FUNCTIONAL TESTS:  DNF endurance: 8 seconds     TODAY'S TREATMENT:    [facility] Adult PT Treatment:                                                DATE: 03/26/2023 Therapeutic Exercise: UBE L5 x 4 min (fwd/bwd) while taking subjective Sidelying thoracic rotation x 10 each Sleep stretch 3 x 20 sec on right Supine horizontal abduction with blue 2 x 20 Supine shoulder flexion dowel with 4# 2 x 10 Supine serratus punch dowel with 4# 2 x 10 Sidelying shoulder abduction with 3# 2 x 15 Sidelying ER with 3# 2 x 15 Manual: Skilled palpation and monitoring of muscle tension while performing TPDN STM right upper trap and posterior shoulder Trigger Point Dry Needling Treatment: Pre-treatment instruction: Patient instructed on dry needling rationale, procedures, and possible side effects including pain during treatment (achy,cramping feeling), bruising, drop of blood, lightheadedness, nausea, sweating. Patient Consent Given: Yes Education handout provided: No Muscles treated: Right upper trap, infraspinatus, teres minor/major,  lat Needle size and number: .30x37mm x 6 Electrical stimulation performed: No Parameters: N/A Treatment response/outcome:  Twitch response elicited and Palpable decrease in muscle tension Post-treatment instructions: Patient instructed to expect possible mild to moderate muscle soreness later today and/or tomorrow. Patient instructed in methods to reduce muscle soreness and to continue prescribed HEP. If patient was dry needled over the lung field, patient was instructed on signs and symptoms of pneumothorax and, however unlikely, to see immediate medical attention should they occur. Patient was also educated on signs and symptoms of infection and to seek medical attention should they occur. Patient verbalized understanding of these instructions and education.   [facility] Adult PT Treatment:                                                DATE: 03/21/2023 Therapeutic Exercise: UBE L5 x 4 min (fwd/bwd) while taking subjective Sidelying shoulder abduction with 3# 2 x 15 Sidelying ER with 3# 2 x 15 Sidelying horizontal abduction with 3# 2 x 15 Row machine 35# 2 x 15 Extension with blue 2 x 15 Supine shoulder flexion dowel with 3# 2 x 10 Supine serratus punch dowel with 3# 2 x 10 Standing scaption with 2# 2 x 10 Manual: Skilled palpation and monitoring of muscle tension while performing TPDN STM right upper trap Trigger Point Dry Needling Treatment: Pre-treatment instruction: Patient instructed on dry needling rationale, procedures, and possible side effects including pain during treatment (achy,cramping feeling), bruising, drop of blood, lightheadedness, nausea, sweating. Patient Consent Given: Yes Education handout provided: No Muscles treated: Right upper trap and infraspinatus Needle size and number: .30x64mm x 5 Electrical stimulation performed: No Parameters: N/A Treatment response/outcome: Twitch response elicited and Palpable decrease in muscle tension Post-treatment instructions: Patient instructed to expect possible mild to moderate muscle soreness later today and/or tomorrow. Patient instructed in methods to  reduce muscle soreness and to continue prescribed HEP. If patient was dry needled over the lung field, patient was instructed on signs and symptoms of pneumothorax and, however unlikely, to see immediate medical attention should they occur. Patient was also educated on signs and symptoms of infection and to seek medical attention should they occur. Patient verbalized understanding of these instructions and education.  [facility] Adult PT Treatment:                                                DATE: 03/04/2023 Therapeutic Exercise: UBE L5 x 4 min (fwd/bwd) while taking subjective Supine cervical retraction 10 x 5 sec hold Seated cervical rotation SNAG 2 x 10 to right Row with blue 2 x 20 Doorway pec stretch at 90 deg 2 x 20 sec ER with red 2 x 15 Step back stretch at counter for thoracic extension 5 x 5 sec Supine horizontal abduction with green 2 x 15 Manual: Skilled palpation and monitoring of muscle tension while performing TPDN STM right upper trap Suboccipital release with gentle manual traction Supine lateral cervical glides Passive cervical retraction Trigger Point Dry Needling Treatment: Pre-treatment instruction: Patient instructed on dry needling rationale, procedures, and possible side effects including pain during treatment (achy,cramping feeling), bruising, drop of blood, lightheadedness, nausea, sweating. Patient Consent Given: Yes Education handout provided: No Muscles treated: Right  upper trap, suboccipitals, right cervical multifidi, right splenius cervicis, right infraspinatus Needle size and number: .30x14mm x 7 Electrical stimulation performed: No Parameters: N/A Treatment response/outcome: Twitch response elicited and Palpable decrease in muscle tension Post-treatment instructions: Patient instructed to expect possible mild to moderate muscle soreness later today and/or tomorrow. Patient instructed in methods to reduce muscle soreness and to continue prescribed HEP. If  patient was dry needled over the lung field, patient was instructed on signs and symptoms of pneumothorax and, however unlikely, to see immediate medical attention should they occur. Patient was also educated on signs and symptoms of infection and to seek medical attention should they occur. Patient verbalized understanding of these instructions and education.   PATIENT EDUCATION:  Education details: POC update, HEP, TPDN Person educated: Patient Education method: Explanation, Demonstration, Tactile cues, Verbal cues Education comprehension: verbalized understanding, returned demonstration, verbal cues required, tactile cues required, and needs further education   HOME EXERCISE PROGRAM: Access Code: [email or web address]      ASSESSMENT: CLINICAL IMPRESSION: Patient tolerated therapy well with no adverse effects. Therapy continued with TPDN for the right neck and posterior shoulder region with multiple twitch responses and good therapeutic benefit. Therapy continues to progress rotator cuff and periscapular strengthening. Overall he reports improvement in his functional status and demonstrates improved cervical motion. He reports improvement in his shoulder control with exercises but does continue to exhibit right shoulder strength deficits. No changes made to HEP this visit. Patient would benefit from continued skilled PT to progress his mobility and strength in order to reduce pain and maximize functional ability, so will extend PT POC for 4 more weeks.     OBJECTIVE IMPAIRMENTS: decreased activity tolerance, decreased ROM, decreased strength, impaired flexibility, postural dysfunction, and pain.    ACTIVITY LIMITATIONS: lifting, reach over head, and hygiene/grooming   PARTICIPATION LIMITATIONS: community activity   PERSONAL FACTORS: Fitness, Past/current experiences, and Time since onset of injury/illness/exacerbation are also affecting patient's functional outcome.      GOALS: Goals reviewed with  patient? Yes   SHORT TERM GOALS: Target date: 03/04/2023   Patient will be I with initial HEP in order to progress with therapy. Baseline: HEP provided at eval 03/04/2023: independent Goal status: MET   2.  Patient will demonstrate right cervical rotation >/= 65 deg in order to improve driving Baseline: 50 deg 1/61/0960: see limitations above 03/26/2023: 65 deg Goal status: MET   3.  Patient will report right neck/shoulder pain at worst </= 5/10 in order to reduce functional limitations Baseline: 7/10 03/04/2023: reports improvement in pain Goal status: MET   LONG TERM GOALS: Target date: 04/23/2023   Patient will be I with final HEP to maintain progress from PT. Baseline: HEP provided at eval 03/26/2023: progressing Goal status: ONGOING   2.  Patient will report >/= 70% status on FOTO to indicate improved functional ability. Baseline: 58% functional status 03/21/2023: 56% 03/26/2023: 59% Goal status: ONGOING   3.  Patient will demonstrate right shoulder strength 5/5 MMT in order to return to exercise without limitation Baseline: grossly 4/5 MMT 03/26/2023: see limitations above Goal status: ONGOING   4.  Patient will report no pain or difficulty with right shoulder motion to improve grooming ability Baseline: patient reports difficulty and pain with right shoulder motion 03/26/2023: patient reports continued limitation with activities such as shaving Goal status: ONGOING     PLAN: PT FREQUENCY: 1-2x/week   PT DURATION: 8 weeks   PLANNED INTERVENTIONS: Therapeutic exercises, Therapeutic activity, Neuromuscular re-education, Balance training,  Gait training, Patient/Family education, Self Care, Joint mobilization, Joint manipulation, Dry Needling, Electrical stimulation, Spinal manipulation, Spinal mobilization, Moist heat, Manual therapy, and Re-evaluation   PLAN FOR NEXT SESSION: Review HEP and progress PRN, manua/TPDN for cervical and shoulder region, postural training and right  shoulder strengthening   Rosana Hoes, PT, DPT, LAT, ATC 03/26/23  12:32 PM Phone: [phone] Fax: [phone]    PHYSICAL THERAPY DISCHARGE SUMMARY  Visits from Start of Care: 7  Current functional level related to goals / functional outcomes: See above   Remaining deficits: See above   Education / Equipment: HEP   Patient agrees to discharge. Patient goals were partially met. Patient is being discharged due to the patient's request.  Rosana Hoes, PT, DPT, LAT, ATC 04/26/23  9:29 AM Phone: [phone] Fax: [phone]

## 2023-04-04 ENCOUNTER — Ambulatory Visit: Payer: 59 | Admitting: Physical Therapy

## 2023-04-09 DIAGNOSIS — M75121 Complete rotator cuff tear or rupture of right shoulder, not specified as traumatic: Secondary | ICD-10-CM | POA: Diagnosis not present

## 2023-04-10 ENCOUNTER — Telehealth: Payer: Self-pay

## 2023-04-10 NOTE — Telephone Encounter (Signed)
   Pre-operative Risk Assessment    Patient Name: Jon Rodriguez  DOB: 01-Feb-1961 MRN: [id number]{ HEARTCARE STAFF-IMPORTANT INSTRUCTIONS 1 Red and Blue Text will auto delete once note is signed or closed. 2 Press F2 to navigate through template.   3 On drop down lists, L click to select >> R click to activate next field 4 Reason for Visit format is IMPORTANT!!  See Directions on No. 2 below. 5 Please review chart to determine if there is already a clearance note open for this procedure!!  DO NOT duplicate if a note already exists!!    :     Request for Surgical Clearance{ 1. What type of surgery is being performed? Enter name of procedure below and number of teeth if dental extraction.   Procedure:   Right Shoulder scope with RCR, biceps tenodesis, SAD  2. When is this surgery scheduled? Press F2 to enter date below and place date in Reason for Visit (see directions below).  Date of Surgery:  Clearance TBD                             For convenience, highlight and copy (CTL+C) the Clearance MM/DD/YY phrase above. Click here to go to Reason for Visit.  Paste (CTL+V) the date.  Engineer, drilling.  Then click button underneath called Add Clearance MM/DD/YY as free text.      3. What is the name of the Surgeon, the Surgeon's Group or Practice, phone and fax number?  Press F2 and list below : Surgeon:  Dr. Duwayne Heck Surgeon's Group or Practice Name:  [facility] Phone number:  [phone] Fax number:  [phone]  4. What type of clearance is requested?  Medical or Cardiac Clearance only?  Pharmacy Clearance Only (Request is to hold medication only)?  Or Both?  Press F2 and select the clearance requested.  If both are needed, select both from the drop down list.   Type of Clearance Requested:    5. What type of anesthesia will be used?  Press F2 and select the anesthesia to be used for the procedure.  : Type of Anesthesia:  Not Indicated  6. Are there any other requests or questions  from the surgeon?  No Additional requests/questions:    Jon Rodriguez   04/10/2023, 1:21 PM

## 2023-04-10 NOTE — Telephone Encounter (Signed)
Patient provided consent and scheduled on 04/17/23 @2 :20 for pre-op tele visit.

## 2023-04-10 NOTE — Telephone Encounter (Signed)
 Error. Duplicate encounter.

## 2023-04-10 NOTE — Telephone Encounter (Signed)
   Name: Jon Rodriguez  DOB: Feb 22, 1961  MRN: [id number]  Primary Cardiologist: Thurmon Fair, MD   Preoperative team, please contact this patient and set up a phone call appointment for further preoperative risk assessment. Please obtain consent and complete medication review. Thank you for your help.  I confirm that guidance regarding antiplatelet and oral anticoagulation therapy has been completed and, if necessary, noted below.  None requested   Ronney Asters, NP 04/10/2023, 1:56 PM [facility]

## 2023-04-10 NOTE — Telephone Encounter (Signed)
  Patient Consent for Virtual Visit        Jon Rodriguez has provided verbal consent on 04/10/2023 for a virtual visit (video or telephone).   CONSENT FOR VIRTUAL VISIT FOR:  Jon Rodriguez  By participating in this virtual visit I agree to the following:  I hereby voluntarily request, consent and authorize [facility] and its employed or contracted physicians, physician assistants, nurse practitioners or other licensed health care professionals (the Practitioner), to provide me with telemedicine health care services (the "Services") as deemed necessary by the treating Practitioner. I acknowledge and consent to receive the Services by the Practitioner via telemedicine. I understand that the telemedicine visit will involve communicating with the Practitioner through live audiovisual communication technology and the disclosure of certain medical information by electronic transmission. I acknowledge that I have been given the opportunity to request an in-person assessment or other available alternative prior to the telemedicine visit and am voluntarily participating in the telemedicine visit.  I understand that I have the right to withhold or withdraw my consent to the use of telemedicine in the course of my care at any time, without affecting my right to future care or treatment, and that the Practitioner or I may terminate the telemedicine visit at any time. I understand that I have the right to inspect all information obtained and/or recorded in the course of the telemedicine visit and may receive copies of available information for a reasonable fee.  I understand that some of the potential risks of receiving the Services via telemedicine include:  Delay or interruption in medical evaluation due to technological equipment failure or disruption; Information transmitted may not be sufficient (e.g. poor resolution of images) to allow for appropriate medical decision making by the Practitioner;  and/or  In rare instances, security protocols could fail, causing a breach of personal health information.  Furthermore, I acknowledge that it is my responsibility to provide information about my medical history, conditions and care that is complete and accurate to the best of my ability. I acknowledge that Practitioner's advice, recommendations, and/or decision may be based on factors not within their control, such as incomplete or inaccurate data provided by me or distortions of diagnostic images or specimens that may result from electronic transmissions. I understand that the practice of medicine is not an exact science and that Practitioner makes no warranties or guarantees regarding treatment outcomes. I acknowledge that a copy of this consent can be made available to me via my patient portal [facility] MyChart), or I can request a printed copy by calling the office of [facility].    I understand that my insurance will be billed for this visit.   I have read or had this consent read to me. I understand the contents of this consent, which adequately explains the benefits and risks of the Services being provided via telemedicine.  I have been provided ample opportunity to ask questions regarding this consent and the Services and have had my questions answered to my satisfaction. I give my informed consent for the services to be provided through the use of telemedicine in my medical care

## 2023-04-11 ENCOUNTER — Ambulatory Visit: Payer: 59 | Admitting: Physical Therapy

## 2023-04-17 ENCOUNTER — Encounter: Payer: Self-pay | Admitting: Nurse Practitioner

## 2023-04-17 ENCOUNTER — Ambulatory Visit: Payer: 59 | Attending: Internal Medicine | Admitting: Nurse Practitioner

## 2023-04-17 DIAGNOSIS — Z01810 Encounter for preprocedural cardiovascular examination: Secondary | ICD-10-CM | POA: Diagnosis not present

## 2023-04-17 NOTE — Progress Notes (Addendum)
Virtual Visit via Telephone Note   Because of Jon Rodriguez's co-morbid illnesses, he is at least at moderate risk for complications without adequate follow up.  This format is felt to be most appropriate for this patient at this time.  The patient did not have access to video technology/had technical difficulties with video requiring transitioning to audio format only (telephone).  All issues noted in this document were discussed and addressed.  No physical exam could be performed with this format.  Please refer to the patient's chart for his consent to telehealth for [facility].  Evaluation Performed:  Preoperative cardiovascular risk assessment _____________   Date:  04/17/2023   Patient ID:  Jon Rodriguez, DOB 09-Jun-1961, MRN [id number] Patient Location:  Home Provider location:   Office  Primary Care Provider:  Daisy Floro, MD Primary Cardiologist:  Thurmon Fair, MD  Chief Complaint / Patient Profile   62 y.o. y/o male with a h/o acute type a aortic dissection 2017 with emergency surgical repair, subsequent progression of extensive aneurysm of ascending aorta, aortic arch and descending aorta requiring two-stage surgical repair by Dr. Kizzie Bane at [facility] April 2020 with well-positioned thoracic stent grafts and widely patent carotid-carotid and aorta-left axillary bypass grafts with known residual dissection involving the left renal artery and left common iliac artery, echo 07/13/22 with normal LVEF, unchanged LVH, normal function of AV bioprosthesis with mean gradient 9 mmHg, obesity, hyperlipidemia, HTN, nocturnal second-degree AV block Mobitz type I with moderate bradycardia, prolonged QT interval who is pending right shoulder scope with RCR, biceps tenodesis, SCD and presents today for telephonic preoperative cardiovascular risk assessment.  History of Present Illness    Jon Rodriguez is a 62 y.o. male who presents via audio/video conferencing for a telehealth  visit today.  Pt was last seen in cardiology clinic on 08/23/22 by Dr. Royann Shivers.  At that time Jon Rodriguez was doing well.  The patient is now pending procedure as outlined above. Since his last visit, he  denies chest pain, shortness of breath, lower extremity edema, fatigue, palpitations, melena, hematuria, hemoptysis, diaphoresis, weakness, presyncope, syncope, orthopnea, and PND. He reports while walking around in [place], he got lightheaded and noted elevated HR on his FitBit. He felt overheated and was sweating. He had taken spironolactone about one hour prior. Felt that symptoms were consistent with dosing of spironolactone, so he stopped it. After approximately 3 days, he had no further episodes of elevated HR, lightheadedness or diaphoresis. He has been working out can distantly with no concerning cardiac symptoms. BP well controlled at 120s over 70s.   Past Medical History    Past Medical History:  Diagnosis Date   High cholesterol    Hypertension    S/P AVR    Type A aortic disection    S/p emergent surgical repair in 2017 subsequent extensive aneurysm of the ascending aorta, aortic arch, and descending aorta requiring a two stage surgical repair with AVR in 12/2018 at 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   Past Surgical History:  Procedure Laterality Date   AORTIC VALVE REPLACEMENT     carotid endartarectomy      COLONOSCOPY  2012   KNEE ARTHROSCOPY WITH MEDIAL MENISECTOMY Left 07/20/2022   Procedure: KNEE ARTHROSCOPY WITH PARTIAL MEDIAL MENISECTOMY;  Surgeon: Yolonda Kida, MD;  Location: [facility];  Service: Orthopedics;  Laterality: Left;  75   KNEE ARTHROSCOPY WITH SUBCHONDROPLASTY Left 07/20/2022   Procedure: KNEE ARTHROSCOPY WITH  MEDIAL TIBIA SUBCHONDROPLASTY;  Surgeon: Yolonda Kida, MD;  Location: [facility];  Service: Orthopedics;  Laterality: Left;  75   REPLACEMENT ASCENDING AORTA N/A 03/31/2016   Procedure: REPLACEMENT OF  ASCENDING AORTA USING A 30mm HEMASHIELD PLATINUM VASCULAR GRAFT;  Surgeon: Kerin Perna, MD;  Location: [facility] OR;  Service: Open Heart Surgery;  Laterality: N/A;   stent placed in abdomen      subclavian stenosis       Allergies  Allergies  Allergen Reactions   Simvastatin     myalgias    Home Medications    Prior to Admission medications   Medication Sig Start Date End Date Taking? Authorizing Provider  amoxicillin (AMOXIL) 500 MG tablet Take 4 tablets (2,000 mg total) by mouth as needed (30-60 minutes before any dental procedure). 07/18/22   Sharlene Dory, NP  aspirin 81 MG EC tablet Take 1 tablet (81 mg total) by mouth daily. 07/25/18   Croitoru, Mihai, MD  carvedilol (COREG) 12.5 MG tablet Take 2 tablets (25 mg total) by mouth 2 (two) times daily with a meal. 02/01/23   Croitoru, Mihai, MD  Cholecalciferol (VITAMIN D3) 50 MCG (2000 UT) TABS Take 2,000 Units by mouth daily.    [provider]  hydrochlorothiazide (HYDRODIURIL) 25 MG tablet Take 1 tablet (25 mg total) by mouth daily. 09/14/22   Croitoru, Mihai, MD  Multiple Vitamins-Minerals (MULTIVITAMIN WITH MINERALS) tablet Take 1 tablet by mouth daily.    [provider]  olmesartan (BENICAR) 40 MG tablet Take 1 tablet (40 mg total) by mouth daily. 10/17/22   Croitoru, Mihai, MD  Omega-3 1000 MG CAPS Take 1,000 mg by mouth daily.    [provider]  rosuvastatin (CRESTOR) 20 MG tablet Take 1 tablet (20 mg total) by mouth daily. 11/13/22   Croitoru, Mihai, MD  sildenafil (VIAGRA) 100 MG tablet Take 100 mg by mouth as needed.    [provider]  spironolactone (ALDACTONE) 25 MG tablet Take 0.5 tablets (12.5 mg total) by mouth daily. 10/17/22   Croitoru, Mihai, MD  tamsulosin (FLOMAX) 0.4 MG CAPS capsule Take 0.4 mg by mouth daily. 06/05/22   [provider]  testosterone cypionate (DEPOTESTOTERONE CYPIONATE) 100 MG/ML injection Inject 0.78 mLs into the skin every 14 (fourteen) days.     [provider]    Physical Exam    Vital Signs:  Jon Rodriguez does not have vital signs available for review today.  Given telephonic nature of communication, physical exam is limited. AAOx3. NAD. Normal affect.  Speech and respirations are unlabored.  Accessory Clinical Findings    None  Assessment & Plan    1.  Preoperative Cardiovascular Risk Assessment: According to the Revised Cardiac Risk Index (RCRI), his Perioperative Risk of Major Cardiac Event is (%): 0.9. His Functional Capacity in METs is: 8.23 according to the Duke Activity Status Index (DASI). The patient is doing well from a cardiac perspective. Therefore, based on ACC/AHA guidelines, the patient would be at acceptable risk for the planned procedure without further cardiovascular testing.   The patient was advised that if he develops new symptoms prior to surgery to contact our office to arrange for a follow-up visit, and he verbalized understanding.  Per office protocol, he may hold aspirin for 5-7 days prior to procedure and should resume as soon as hemodynamically stable postoperatively.    A copy of this note will be routed to requesting surgeon.  Time:   Today, I have spent 10 minutes with the  patient with telehealth technology discussing medical history, symptoms, and management plan.     Levi Aland, NP-C  04/17/2023, 2:09 PM [address] Office [phone] Fax [phone]

## 2023-06-25 NOTE — Patient Instructions (Addendum)
SURGICAL WAITING ROOM VISITATION  Patients having surgery or a procedure may have no more than 2 support people in the waiting area - these visitors may rotate.    Children under the age of 47 must have an adult with them who is not the patient.  Due to an increase in RSV and influenza rates and associated hospitalizations, children ages 74 and under may not visit patients in [facility] hospitals.  If the patient needs to stay at the hospital during part of their recovery, the visitor guidelines for inpatient rooms apply. Pre-op nurse will coordinate an appropriate time for 1 support person to accompany patient in pre-op.  This support person may not rotate.    Please refer to the [facility] website for the visitor guidelines for Inpatients (after your surgery is over and you are in a regular room).       Your procedure is scheduled on:  07/05/2023    Report to [facility] Main Entrance    Report to admitting at  0515 AM   Call this number if you have problems the morning of surgery [phone]   Do not eat food :After Midnight.   After Midnight you may have the following liquids until _ 0415_____ AM DAY OF SURGERY  Water Non-Citrus Juices (without pulp, NO RED-Apple, White grape, White cranberry) Black Coffee (NO MILK/CREAM OR CREAMERS, sugar ok)  Clear Tea (NO MILK/CREAM OR CREAMERS, sugar ok) regular and decaf                             Plain Jell-O (NO RED)                                           Fruit ices (not with fruit pulp, NO RED)                                     Popsicles (NO RED)                                                               Sports drinks like Gatorade (NO RED)                     The day of surgery:  Drink ONE (1) Pre-Surgery Clear Ensure or G2 at  0415  ( have completed by )  the morning of surgery. Drink in one sitting. Do not sip.  This drink was given to you during your hospital  pre-op appointment visit. Nothing else to  drink after completing the  Pre-Surgery Clear Ensure or G2.          If you have questions, please contact your surgeon's office.      Oral Hygiene is also important to reduce your risk of infection.                                    Remember - BRUSH YOUR TEETH THE MORNING OF SURGERY WITH  YOUR REGULAR TOOTHPASTE  DENTURES WILL BE REMOVED PRIOR TO SURGERY PLEASE DO NOT APPLY "Poly grip" OR ADHESIVES!!!   Do NOT smoke after Midnight   Stop all vitamins and herbal supplements 7 days before surgery.   Take these medicines the morning of surgery with A SIP OF WATER:   Coreg -  DO NOT TAKE ANY ORAL DIABETIC MEDICATIONS DAY OF YOUR SURGERY  Bring CPAP mask and tubing day of surgery.                              You may not have any metal on your body including hair pins, jewelry, and body piercing             Do not wear make-up, lotions, powders, perfumes/cologne, or deodorant  Do not wear nail polish including gel and S&S, artificial/acrylic nails, or any other type of covering on natural nails including finger and toenails. If you have artificial nails, gel coating, etc. that needs to be removed by a nail salon please have this removed prior to surgery or surgery may need to be canceled/ delayed if the surgeon/ anesthesia feels like they are unable to be safely monitored.   Do not shave  48 hours prior to surgery.               Men may shave face and neck.   Do not bring valuables to the hospital. [facility] IS NOT             RESPONSIBLE   FOR VALUABLES.   Contacts, glasses, dentures or bridgework may not be worn into surgery.   Bring small overnight bag day of surgery.   DO NOT BRING YOUR HOME MEDICATIONS TO THE HOSPITAL. PHARMACY WILL DISPENSE MEDICATIONS LISTED ON YOUR MEDICATION LIST TO YOU DURING YOUR ADMISSION IN THE HOSPITAL!    Patients discharged on the day of surgery will not be allowed to drive home.  Someone NEEDS to stay with you for the first 24 hours after  anesthesia.   Special Instructions: Bring a copy of your healthcare power of attorney and living will documents the day of surgery if you haven't scanned them before.              Please read over the following fact sheets you were given: IF YOU HAVE QUESTIONS ABOUT YOUR PRE-OP INSTRUCTIONS PLEASE CALL [phone]   If you received a COVID test during your pre-op visit  it is requested that you wear a mask when out in public, stay away from anyone that may not be feeling well and notify your surgeon if you develop symptoms. If you test positive for Covid or have been in contact with anyone that has tested positive in the last 10 days please notify you surgeon.    [facility] - Preparing for Surgery Before surgery, you can play an important role.  Because skin is not sterile, your skin needs to be as free of germs as possible.  You can reduce the number of germs on your skin by washing with CHG (chlorahexidine gluconate) soap before surgery.  CHG is an antiseptic cleaner which kills germs and bonds with the skin to continue killing germs even after washing. Please DO NOT use if you have an allergy to CHG or antibacterial soaps.  If your skin becomes reddened/irritated stop using the CHG and inform your nurse when you arrive at Short Stay. Do not shave (including legs  and underarms) for at least 48 hours prior to the first CHG shower.  You may shave your face/neck. Please follow these instructions carefully:  1.  Shower with CHG Soap the night before surgery and the  morning of Surgery.  2.  If you choose to wash your hair, wash your hair first as usual with your  normal  shampoo.  3.  After you shampoo, rinse your hair and body thoroughly to remove the  shampoo.                           4.  Use CHG as you would any other liquid soap.  You can apply chg directly  to the skin and wash                       Gently with a scrungie or clean washcloth.  5.  Apply the CHG Soap to your body ONLY FROM THE  NECK DOWN.   Do not use on face/ open                           Wound or open sores. Avoid contact with eyes, ears mouth and genitals (private parts).                       Wash face,  Genitals (private parts) with your normal soap.             6.  Wash thoroughly, paying special attention to the area where your surgery  will be performed.  7.  Thoroughly rinse your body with warm water from the neck down.  8.  DO NOT shower/wash with your normal soap after using and rinsing off  the CHG Soap.                9.  Pat yourself dry with a clean towel.            10.  Wear clean pajamas.            11.  Place clean sheets on your bed the night of your first shower and do not  sleep with pets. Day of Surgery : Do not apply any lotions/deodorants the morning of surgery.  Please wear clean clothes to the hospital/surgery center.  FAILURE TO FOLLOW THESE INSTRUCTIONS MAY RESULT IN THE CANCELLATION OF YOUR SURGERY PATIENT SIGNATURE_________________________________  NURSE SIGNATURE__________________________________  ________________________________________________________________________

## 2023-06-25 NOTE — Progress Notes (Signed)
Anesthesia Review:  PCP: Cardiologist : Croituri- LOV 08/23/22  Jon Rodriguez telephone visit on 04/17/23 - on chart  Chest x-ray : EKG : 08/23/2022  Echo : 2019  Monitor- 2020 Stress test: Cardiac Cath :  Activity level:  Sleep Study/ CPAP : Fasting Blood Sugar :      / Checks Blood Sugar -- times a day:   Blood Thinner/ Instructions /Last Dose: ASA / Instructions/ Last Dose :

## 2023-06-26 ENCOUNTER — Encounter (HOSPITAL_COMMUNITY)
Admission: RE | Admit: 2023-06-26 | Discharge: 2023-06-26 | Disposition: A | Discharge status: Home / Self Care | Payer: 59 | Source: Ambulatory Visit | Attending: Orthopedic Surgery | Admitting: Orthopedic Surgery

## 2023-06-26 ENCOUNTER — Encounter (HOSPITAL_COMMUNITY): Payer: Self-pay

## 2023-06-26 ENCOUNTER — Other Ambulatory Visit: Payer: Self-pay

## 2023-06-26 VITALS — BP 157/99 | HR 81 | Temp 97.7°F | Resp 16 | Ht 73.0 in | Wt 225.0 lb

## 2023-06-26 DIAGNOSIS — M75101 Unspecified rotator cuff tear or rupture of right shoulder, not specified as traumatic: Secondary | ICD-10-CM | POA: Diagnosis not present

## 2023-06-26 DIAGNOSIS — S46211A Strain of muscle, fascia and tendon of other parts of biceps, right arm, initial encounter: Secondary | ICD-10-CM | POA: Insufficient documentation

## 2023-06-26 DIAGNOSIS — I10 Essential (primary) hypertension: Secondary | ICD-10-CM | POA: Diagnosis not present

## 2023-06-26 DIAGNOSIS — Z87891 Personal history of nicotine dependence: Secondary | ICD-10-CM | POA: Diagnosis not present

## 2023-06-26 DIAGNOSIS — X58XXXA Exposure to other specified factors, initial encounter: Secondary | ICD-10-CM | POA: Insufficient documentation

## 2023-06-26 DIAGNOSIS — Z01818 Encounter for other preprocedural examination: Secondary | ICD-10-CM

## 2023-06-26 DIAGNOSIS — M25811 Other specified joint disorders, right shoulder: Secondary | ICD-10-CM | POA: Insufficient documentation

## 2023-06-26 DIAGNOSIS — Z01812 Encounter for preprocedural laboratory examination: Secondary | ICD-10-CM | POA: Insufficient documentation

## 2023-06-26 LAB — BASIC METABOLIC PANEL
Anion gap: 8 (ref 5–15)
BUN: 10 mg/dL (ref 8–23)
CO2: 26 mmol/L (ref 22–32)
Calcium: 9.4 mg/dL (ref 8.9–10.3)
Chloride: 96 mmol/L — ABNORMAL LOW (ref 98–111)
Creatinine, Ser: 0.86 mg/dL (ref 0.61–1.24)
GFR, Estimated: >60 mL/min (ref >60)
Glucose, Bld: 99 mg/dL (ref 70–99)
Potassium: 4.1 mmol/L (ref 3.5–5.1)
Sodium: 130 mmol/L — ABNORMAL LOW (ref 135–145)

## 2023-06-26 LAB — CBC
HCT: 46.9 % (ref 39.0–52.0)
Hemoglobin: 17 g/dL (ref 13.0–17.0)
MCH: 33.5 pg (ref 26.0–34.0)
MCHC: 36.2 g/dL — ABNORMAL HIGH (ref 30.0–36.0)
MCV: 92.5 fL (ref 80.0–100.0)
Platelets: 206 10*3/uL (ref 150–400)
RBC: 5.07 MIL/uL (ref 4.22–5.81)
RDW: 12.5 % (ref 11.5–15.5)
WBC: 6.8 10*3/uL (ref 4.0–10.5)
nRBC: 0 % (ref 0.0–0.2)

## 2023-06-27 NOTE — Progress Notes (Addendum)
Anesthesia Chart Review   Case: [id number] Date/Time: 07/05/23 0700   Procedures:      SHOULDER ARTHROSCOPY WITH ROTATOR CUFF REPAIR WITH BICEPS TENODESIS (Right) - 120     SHOULDER ARTHROSCOPY WITH SUBACROMIAL DECOMPRESSION (Right) - 120   Anesthesia type: Choice   Pre-op diagnosis: Right shoulder rotator cuff tear, impingement, biceps tear   [facility] RO[facility] 08 / [facility]   Surgeons: Yolonda Kida, MD       DISCUSSION:61 y.o. former smoker with h/o HTN, h/o acute type a aortic dissection 2017 with emergency surgical repair, subsequent progression of extensive aneurysm of ascending aorta, aortic arch and descending aorta requiring two-stage surgical repair by Dr. Kizzie Bane at [facility] April 2020 (stable on CT 06/2022 and Echo 07/13/2022), right shoulder arthropathy scheduled for above procedure 07/05/2023 with Dr. Duwayne Heck.   Pt last seen by cardiology 04/17/2023. Per OV note, "Preoperative Cardiovascular Risk Assessment: According to the Revised Cardiac Risk Index (RCRI), his Perioperative Risk of Major Cardiac Event is (%): 0.9. His Functional Capacity in METs is: 8.23 according to the Duke Activity Status Index (DASI). The patient is doing well from a cardiac perspective. Therefore, based on ACC/AHA guidelines, the patient would be at acceptable risk for the planned procedure without further cardiovascular testing.    The patient was advised that if he develops new symptoms prior to surgery to contact our office to arrange for a follow-up visit, and he verbalized understanding.   Per office protocol, he may hold aspirin for 5-7 days prior to procedure and should resume as soon as hemodynamically stable postoperatively."  VS: BP (!) 157/99   Pulse 81   Temp 36.5 C (Oral)   Resp 16   Ht 6\' 1"  (1.854 m)   Wt 102.1 kg   SpO2 100%   BMI 29.69 kg/m   PROVIDERS: Daisy Floro, MD is PCP   Primary Cardiologist:  Thurmon Fair, MD   LABS: Labs reviewed: Acceptable for  surgery. (all labs ordered are listed, but only abnormal results are displayed)  Labs Reviewed  CBC - Abnormal; Notable for the following components:      Result Value   MCHC 36.2 (*)    All other components within normal limits  BASIC METABOLIC PANEL - Abnormal; Notable for the following components:   Sodium 130 (*)    Chloride 96 (*)    All other components within normal limits     IMAGES:   EKG:   CV: CT Angio 07/13/2022 Impression:  1. Stable thoracic aorta post surgical changes with unchanged diameter of  dilated sinuses of Valsalva (measuring up to 5.0 cm) and the thoracic aorta  measuring up to 5.8 cm at the distal aortic arch. No evidence of  postoperative complications or endoleak.  2.  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  3.  Similar, mild hypoattenuating leaflet thickening (HALT) of the  bioprosthetic aortic valve.   Echo 07/13/2022 INTERPRETATION ---------------------------------------------------------------    NORMAL LEFT VENTRICULAR SYSTOLIC FUNCTION WITH MODERATE LVH    NORMAL RIGHT VENTRICULAR SYSTOLIC FUNCTION    VALVULAR REGURGITATION: TRIVIAL MR, TRIVIAL PR, TRIVIAL TR    PROSTHETIC VALVE(S): BIOPROSTHETIC AoV    3D acquisition and reconstructions were performed as part of this    examination to more accurately quantify the effects of identified    structural abnormalities as part of the exam. (post-processing on an    Independent workstation).     Compared with prior Echo  study on 01/01/2020: NO SIGNIFICANT CHANGES.   Echo 07/28/2018 - Left ventricle: The cavity size was moderately dilated. Left    ventricular geometry showed evidence of eccentric hypertrophy,    with moderately increased relative wall thickness. Systolic    function was normal. The estimated ejection fraction was in the    range of 55% to 60%. Features are consistent with a pseudonormal    left  ventricular filling pattern, with concomitant abnormal    relaxation and increased filling pressure (grade 2 diastolic    dysfunction).  - Aortic valve: There was moderate to severe regurgitation directed    eccentrically in the LVOT and towards the mitral anterior    leaflet. Severe regurgitation is suggested by holodiastolic flow    reversal in the descending aorta.  - Mitral valve: There was mild regurgitation.  - Left atrium: The atrium was mildly dilated.  - Right ventricle: Systolic function was mildly reduced.  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    Past Surgical History:  Procedure Laterality Date   AORTIC VALVE REPLACEMENT     carotid endartarectomy      COLONOSCOPY  2012   KNEE ARTHROSCOPY WITH MEDIAL MENISECTOMY Left 07/20/2022   Procedure: KNEE ARTHROSCOPY WITH PARTIAL MEDIAL MENISECTOMY;  Surgeon: Yolonda Kida, MD;  Location: [facility];  Service: Orthopedics;  Laterality: Left;  75   KNEE ARTHROSCOPY WITH SUBCHONDROPLASTY Left 07/20/2022   Procedure: KNEE ARTHROSCOPY WITH MEDIAL TIBIA SUBCHONDROPLASTY;  Surgeon: Yolonda Kida, MD;  Location: [facility];  Service: Orthopedics;  Laterality: Left;  75   REPLACEMENT ASCENDING AORTA N/A 03/31/2016   Procedure: REPLACEMENT OF ASCENDING AORTA USING A 30mm HEMASHIELD PLATINUM VASCULAR GRAFT;  Surgeon: Kerin Perna, MD;  Location: [facility] OR;  Service: Open Heart Surgery;  Laterality: N/A;   stent placed in abdomen      subclavian stenosis       MEDICATIONS:  acetaminophen (TYLENOL) 500 MG tablet   amoxicillin (AMOXIL) 500 MG tablet   aspirin 81 MG EC tablet   Aspirin-Caffeine (BC FAST  PAIN RELIEF PO)   carvedilol (COREG) 12.5 MG tablet   Cholecalciferol (VITAMIN D3) 50 MCG (2000 UT) TABS   ferrous sulfate 325 (65 FE) MG tablet   hydrochlorothiazide (HYDRODIURIL) 25 MG tablet   Multiple Vitamins-Minerals (MULTIVITAMIN WITH MINERALS) tablet   olmesartan (BENICAR) 40 MG tablet   Omega-3 1000 MG CAPS   rosuvastatin (CRESTOR) 20 MG tablet   sildenafil (VIAGRA) 100 MG tablet   tamsulosin (FLOMAX) 0.4 MG CAPS capsule   testosterone cypionate (DEPOTESTOTERONE CYPIONATE) 100 MG/ML injection   Tirzepatide-Weight Management (ZEPBOUND Mobile)   No current facility-administered medications for this encounter.    Jodell Cipro Ward, PA-C [facility]Surgical Testing [phone]

## 2023-06-27 NOTE — Anesthesia Preprocedure Evaluation (Addendum)
Anesthesia Evaluation  Patient identified by MRN, date of birth, ID band Patient awake    Reviewed: Allergy & Precautions, H&P , NPO status , Patient's Chart, lab work & pertinent test results  Airway Mallampati: II  TM Distance: >3 FB Neck ROM: Full    Dental no notable dental hx.    Pulmonary former smoker   Pulmonary exam normal breath sounds clear to auscultation       Cardiovascular hypertension, Normal cardiovascular exam Rhythm:Regular Rate:Normal  Hx of Type A. S/p AVR hemi arch 2017   Neuro/Psych negative neurological ROS  negative psych ROS   GI/Hepatic negative GI ROS, Neg liver ROS,,,  Endo/Other  negative endocrine ROS    Renal/GU negative Renal ROS  negative genitourinary   Musculoskeletal negative musculoskeletal ROS (+)    Abdominal   Peds negative pediatric ROS (+)  Hematology  (+) Blood dyscrasia, anemia   Anesthesia Other Findings All: Simvastatin, Quinilones  Reproductive/Obstetrics negative OB ROS                              Anesthesia Physical Anesthesia Plan  ASA: 3  Anesthesia Plan: General and Regional   Post-op Pain Management:    Induction: Intravenous  PONV Risk Score and Plan: Ondansetron and Dexamethasone  Airway Management Planned: Oral ETT  Additional Equipment:   Intra-op Plan:   Post-operative Plan: Extubation in OR  Informed Consent: I have reviewed the patients History and Physical, chart, labs and discussed the procedure including the risks, benefits and alternatives for the proposed anesthesia with the patient or authorized representative who has indicated his/her understanding and acceptance.     Dental advisory given  Plan Discussed with: CRNA  Anesthesia Plan Comments: (See PAT note 06/26/23, Shanda Bumps Ward  62 y.o. former smoker with h/o HTN, h/o acute type a aortic dissection 2017 with emergency surgical repair, subsequent  progression of extensive aneurysm of ascending aorta, aortic arch and descending aorta requiring two-stage surgical repair by Dr. Kizzie Bane at [facility] April 2020 (stable on CT 06/2022 and Echo 07/13/2022), right shoulder arthropathy scheduled for above procedure 07/05/2023 with Dr. Duwayne Heck. )        Anesthesia Quick Evaluation

## 2023-07-05 ENCOUNTER — Ambulatory Visit (HOSPITAL_BASED_OUTPATIENT_CLINIC_OR_DEPARTMENT_OTHER): Payer: 59 | Admitting: Certified Registered"

## 2023-07-05 ENCOUNTER — Ambulatory Visit (HOSPITAL_COMMUNITY)
Admission: RE | Admit: 2023-07-05 | Discharge: 2023-07-05 | Disposition: A | Discharge status: Home / Self Care | Payer: 59 | Source: Ambulatory Visit | Attending: Orthopedic Surgery | Admitting: Orthopedic Surgery

## 2023-07-05 ENCOUNTER — Other Ambulatory Visit (HOSPITAL_COMMUNITY): Payer: Self-pay

## 2023-07-05 ENCOUNTER — Other Ambulatory Visit: Payer: Self-pay

## 2023-07-05 ENCOUNTER — Encounter (HOSPITAL_COMMUNITY): Payer: Self-pay | Admitting: Orthopedic Surgery

## 2023-07-05 ENCOUNTER — Encounter (HOSPITAL_COMMUNITY)
Admission: RE | Disposition: A | Discharge status: Home / Self Care | Payer: Self-pay | Source: Ambulatory Visit | Attending: Orthopedic Surgery

## 2023-07-05 ENCOUNTER — Ambulatory Visit (HOSPITAL_COMMUNITY): Payer: 59 | Admitting: Physician Assistant

## 2023-07-05 DIAGNOSIS — M75121 Complete rotator cuff tear or rupture of right shoulder, not specified as traumatic: Secondary | ICD-10-CM | POA: Diagnosis not present

## 2023-07-05 DIAGNOSIS — Z87891 Personal history of nicotine dependence: Secondary | ICD-10-CM | POA: Diagnosis not present

## 2023-07-05 DIAGNOSIS — I10 Essential (primary) hypertension: Secondary | ICD-10-CM

## 2023-07-05 DIAGNOSIS — G8918 Other acute postprocedural pain: Secondary | ICD-10-CM | POA: Diagnosis not present

## 2023-07-05 DIAGNOSIS — M94211 Chondromalacia, right shoulder: Secondary | ICD-10-CM | POA: Diagnosis not present

## 2023-07-05 DIAGNOSIS — S46111A Strain of muscle, fascia and tendon of long head of biceps, right arm, initial encounter: Secondary | ICD-10-CM | POA: Diagnosis not present

## 2023-07-05 DIAGNOSIS — Z952 Presence of prosthetic heart valve: Secondary | ICD-10-CM | POA: Insufficient documentation

## 2023-07-05 DIAGNOSIS — Z01818 Encounter for other preprocedural examination: Secondary | ICD-10-CM

## 2023-07-05 DIAGNOSIS — M25811 Other specified joint disorders, right shoulder: Secondary | ICD-10-CM | POA: Diagnosis not present

## 2023-07-05 DIAGNOSIS — M75101 Unspecified rotator cuff tear or rupture of right shoulder, not specified as traumatic: Secondary | ICD-10-CM | POA: Diagnosis not present

## 2023-07-05 DIAGNOSIS — S43431A Superior glenoid labrum lesion of right shoulder, initial encounter: Secondary | ICD-10-CM | POA: Diagnosis not present

## 2023-07-05 DIAGNOSIS — X58XXXA Exposure to other specified factors, initial encounter: Secondary | ICD-10-CM | POA: Diagnosis not present

## 2023-07-05 DIAGNOSIS — M7521 Bicipital tendinitis, right shoulder: Secondary | ICD-10-CM | POA: Insufficient documentation

## 2023-07-05 HISTORY — PX: SHOULDER ARTHROSCOPY WITH ROTATOR CUFF REPAIR: SHX5685

## 2023-07-05 HISTORY — PX: SHOULDER ARTHROSCOPY WITH SUBACROMIAL DECOMPRESSION: SHX5684

## 2023-07-05 SURGERY — ARTHROSCOPY, SHOULDER, WITH ROTATOR CUFF REPAIR
Anesthesia: Regional | Site: Shoulder | Laterality: Right

## 2023-07-05 MED ORDER — LACTATED RINGERS IV SOLN
INTRAVENOUS | Status: DC | PRN
Start: 2023-07-05 — End: 2023-07-05

## 2023-07-05 MED ORDER — ROCURONIUM BROMIDE 10 MG/ML (PF) SYRINGE
PREFILLED_SYRINGE | INTRAVENOUS | Status: DC | PRN
  Administered 2023-07-05: 20 mg via INTRAVENOUS
  Administered 2023-07-05: 70 mg via INTRAVENOUS

## 2023-07-05 MED ORDER — HYDROCODONE-ACETAMINOPHEN 7.5-325 MG PO TABS
1.0000 | ORAL_TABLET | Freq: Four times a day (QID) | ORAL | 0 refills | Status: DC | PRN
  Filled 2023-07-05: qty 30, 4d supply, fill #0

## 2023-07-05 MED ORDER — PROPOFOL 10 MG/ML IV BOLUS
INTRAVENOUS | Status: AC
  Filled 2023-07-05: qty 20

## 2023-07-05 MED ORDER — CHLORHEXIDINE GLUCONATE 0.12 % MT SOLN
15.0000 mL | Freq: Once | OROMUCOSAL | Status: AC
  Administered 2023-07-05: 15 mL via OROMUCOSAL

## 2023-07-05 MED ORDER — LIDOCAINE 2% (20 MG/ML) 5 ML SYRINGE
INTRAMUSCULAR | Status: DC | PRN
  Administered 2023-07-05: 40 mg via INTRAVENOUS

## 2023-07-05 MED ORDER — GLYCOPYRROLATE 0.2 MG/ML IJ SOLN
INTRAMUSCULAR | Status: DC | PRN
Start: 2023-07-05 — End: 2023-07-05
  Administered 2023-07-05 (×2): .2 mg via INTRAVENOUS

## 2023-07-05 MED ORDER — OXYCODONE HCL 5 MG/5ML PO SOLN: 5.0000 mg | Freq: Once | ORAL | Status: DC | PRN

## 2023-07-05 MED ORDER — FENTANYL CITRATE (PF) 100 MCG/2ML IJ SOLN
INTRAMUSCULAR | Status: DC | PRN
  Administered 2023-07-05 (×2): 50 ug via INTRAVENOUS

## 2023-07-05 MED ORDER — DEXAMETHASONE SODIUM PHOSPHATE 10 MG/ML IJ SOLN
INTRAMUSCULAR | Status: DC | PRN
  Administered 2023-07-05: 4 mg via INTRAVENOUS

## 2023-07-05 MED ORDER — PHENYLEPHRINE 80 MCG/ML (10ML) SYRINGE FOR IV PUSH (FOR BLOOD PRESSURE SUPPORT)
PREFILLED_SYRINGE | INTRAVENOUS | Status: DC | PRN
  Administered 2023-07-05 (×2): 80 ug via INTRAVENOUS

## 2023-07-05 MED ORDER — FENTANYL CITRATE (PF) 100 MCG/2ML IJ SOLN
INTRAMUSCULAR | Status: AC
  Filled 2023-07-05: qty 2

## 2023-07-05 MED ORDER — ACETAMINOPHEN 10 MG/ML IV SOLN: 1000.0000 mg | Freq: Once | INTRAVENOUS | Status: DC | PRN

## 2023-07-05 MED ORDER — MIDAZOLAM HCL 2 MG/2ML IJ SOLN
INTRAMUSCULAR | Status: DC | PRN
  Administered 2023-07-05: 2 mg via INTRAVENOUS

## 2023-07-05 MED ORDER — ONDANSETRON HCL 4 MG/2ML IJ SOLN: 4.0000 mg | Freq: Once | INTRAMUSCULAR | Status: DC | PRN

## 2023-07-05 MED ORDER — EPHEDRINE 5 MG/ML INJ
INTRAVENOUS | Status: AC
  Filled 2023-07-05: qty 10

## 2023-07-05 MED ORDER — ONDANSETRON HCL 4 MG/2ML IJ SOLN
INTRAMUSCULAR | Status: DC | PRN
  Administered 2023-07-05: 4 mg via INTRAVENOUS

## 2023-07-05 MED ORDER — DEXAMETHASONE SODIUM PHOSPHATE 10 MG/ML IJ SOLN
INTRAMUSCULAR | Status: AC
  Filled 2023-07-05: qty 1

## 2023-07-05 MED ORDER — LACTATED RINGERS IV SOLN: INTRAVENOUS | Status: DC

## 2023-07-05 MED ORDER — OXYCODONE HCL 5 MG PO TABS: 5.0000 mg | ORAL_TABLET | Freq: Once | ORAL | Status: DC | PRN

## 2023-07-05 MED ORDER — AMISULPRIDE (ANTIEMETIC) 5 MG/2ML IV SOLN: 10.0000 mg | Freq: Once | INTRAVENOUS | Status: DC | PRN

## 2023-07-05 MED ORDER — FENTANYL CITRATE PF 50 MCG/ML IJ SOSY: 25.0000 ug | PREFILLED_SYRINGE | INTRAMUSCULAR | Status: DC | PRN

## 2023-07-05 MED ORDER — PROPOFOL 10 MG/ML IV BOLUS
INTRAVENOUS | Status: DC | PRN
  Administered 2023-07-05: 150 mg via INTRAVENOUS

## 2023-07-05 MED ORDER — PHENYLEPHRINE HCL-NACL 20-0.9 MG/250ML-% IV SOLN
INTRAVENOUS | Status: DC | PRN
Start: 2023-07-05 — End: 2023-07-05
  Administered 2023-07-05: 30 ug/min via INTRAVENOUS

## 2023-07-05 MED ORDER — ONDANSETRON HCL 4 MG/2ML IJ SOLN
INTRAMUSCULAR | Status: AC
  Filled 2023-07-05: qty 2

## 2023-07-05 MED ORDER — CEFAZOLIN SODIUM-DEXTROSE 2-4 GM/100ML-% IV SOLN
2.0000 g | INTRAVENOUS | Status: AC
  Administered 2023-07-05: 2 g via INTRAVENOUS
  Filled 2023-07-05: qty 100

## 2023-07-05 MED ORDER — ROCURONIUM BROMIDE 10 MG/ML (PF) SYRINGE
PREFILLED_SYRINGE | INTRAVENOUS | Status: AC
  Filled 2023-07-05: qty 10

## 2023-07-05 MED ORDER — BUPIVACAINE HCL (PF) 0.25 % IJ SOLN
INTRAMUSCULAR | Status: AC
  Filled 2023-07-05: qty 30

## 2023-07-05 MED ORDER — EPINEPHRINE PF 1 MG/ML IJ SOLN
INTRAMUSCULAR | Status: AC
  Filled 2023-07-05: qty 1

## 2023-07-05 MED ORDER — BUPIVACAINE LIPOSOME 1.3 % IJ SUSP
INTRAMUSCULAR | Status: DC | PRN
Start: 2023-07-05 — End: 2023-07-05
  Administered 2023-07-05: 10 mL

## 2023-07-05 MED ORDER — SODIUM CHLORIDE 0.9 % IR SOLN
Status: DC | PRN
  Administered 2023-07-05: 6000 mL
  Administered 2023-07-05: 3000 mL

## 2023-07-05 MED ORDER — SUGAMMADEX SODIUM 200 MG/2ML IV SOLN
INTRAVENOUS | Status: DC | PRN
  Administered 2023-07-05: 200 mg via INTRAVENOUS

## 2023-07-05 MED ORDER — PHENYLEPHRINE 80 MCG/ML (10ML) SYRINGE FOR IV PUSH (FOR BLOOD PRESSURE SUPPORT)
PREFILLED_SYRINGE | INTRAVENOUS | Status: AC
  Filled 2023-07-05: qty 10

## 2023-07-05 MED ORDER — EPHEDRINE SULFATE-NACL 50-0.9 MG/10ML-% IV SOSY
PREFILLED_SYRINGE | INTRAVENOUS | Status: DC | PRN
  Administered 2023-07-05: 5 mg via INTRAVENOUS
  Administered 2023-07-05 (×3): 10 mg via INTRAVENOUS

## 2023-07-05 MED ORDER — MIDAZOLAM HCL 2 MG/2ML IJ SOLN
INTRAMUSCULAR | Status: AC
  Filled 2023-07-05: qty 2

## 2023-07-05 MED ORDER — BUPIVACAINE HCL (PF) 0.5 % IJ SOLN
INTRAMUSCULAR | Status: DC | PRN
Start: 2023-07-05 — End: 2023-07-05
  Administered 2023-07-05: 10 mL

## 2023-07-05 MED ORDER — HYDROMORPHONE HCL 1 MG/ML IJ SOLN: 0.2500 mg | INTRAMUSCULAR | Status: DC | PRN

## 2023-07-05 MED ORDER — ONDANSETRON HCL 4 MG PO TABS
4.0000 mg | ORAL_TABLET | Freq: Three times a day (TID) | ORAL | 0 refills | Status: DC | PRN
  Filled 2023-07-05: qty 20, 7d supply, fill #0

## 2023-07-05 MED ORDER — EPINEPHRINE 1 MG/ML IJ SOLN
INTRAMUSCULAR | Status: DC | PRN
  Administered 2023-07-05: 1 mg

## 2023-07-05 MED ORDER — ATROPINE SULFATE 0.4 MG/ML IV SOLN
INTRAVENOUS | Status: AC
  Filled 2023-07-05: qty 1

## 2023-07-05 MED ORDER — GLYCOPYRROLATE 0.2 MG/ML IJ SOLN
INTRAMUSCULAR | Status: AC
  Filled 2023-07-05: qty 2

## 2023-07-05 MED ORDER — ORAL CARE MOUTH RINSE: 15.0000 mL | Freq: Once | OROMUCOSAL | Status: AC

## 2023-07-05 MED ORDER — DROPERIDOL 2.5 MG/ML IJ SOLN: 0.6250 mg | Freq: Once | INTRAMUSCULAR | Status: DC | PRN

## 2023-07-05 SURGICAL SUPPLY — 64 items
ANCH SUT FBRTAPE 1.3X2.6X1.7 (Anchor) ×1 IMPLANT
ANCH SUT FBRTK 1.3X2.6X1.7 SLF (Anchor) ×1 IMPLANT
ANCHOR FIBERTAK 2.6X1.7 BLUE (Anchor) IMPLANT
ANCHOR SUT FBRTK 2.6 SOFT 1.7 (Anchor) IMPLANT
ANCHOR SWIVELOCK SP KL 4.75 (Anchor) IMPLANT
BAG COUNTER SPONGE SURGICOUNT (BAG) ×1 IMPLANT
BAG SPNG CNTER NS LX DISP (BAG) ×1
BURR OVAL 8 FLU 4.0X13 (MISCELLANEOUS) ×1 IMPLANT
CANNULA 5.75X7 CRYSTAL CLEAR (CANNULA) IMPLANT
CANNULA 5.75X71 LONG (CANNULA) IMPLANT
CANNULA PASSPORT BUTTON 8X4 (CANNULA) IMPLANT
CANNULA TWIST IN 8.25X7CM (CANNULA) IMPLANT
CUTTER BONE 4.0MM X 13CM (MISCELLANEOUS) IMPLANT
DISSECTOR 3.8MM X 13CM (MISCELLANEOUS) IMPLANT
DRAPE ORTHO SPLIT 77X108 STRL (DRAPES) ×2
DRAPE SHEET LG 3/4 BI-LAMINATE (DRAPES) ×1 IMPLANT
DRAPE STERI 35X30 U-POUCH (DRAPES) ×1 IMPLANT
DRAPE SURG 17X23 STRL (DRAPES) ×1 IMPLANT
DRAPE SURG ORHT 6 SPLT 77X108 (DRAPES) ×2 IMPLANT
DRAPE U-SHAPE 47X51 STRL (DRAPES) ×1 IMPLANT
DURAPREP 26ML APPLICATOR (WOUND CARE) ×1 IMPLANT
ELECT REM PT RETURN 15FT ADLT (MISCELLANEOUS) IMPLANT
FIBERSTICK 2 (SUTURE) IMPLANT
GAUZE PAD ABD 8X10 STRL (GAUZE/BANDAGES/DRESSINGS) ×3 IMPLANT
GAUZE SPONGE 4X4 12PLY STRL (GAUZE/BANDAGES/DRESSINGS) ×1 IMPLANT
GLOVE BIO SURGEON STRL SZ7.5 (GLOVE) ×2 IMPLANT
GLOVE BIOGEL PI IND STRL 8 (GLOVE) ×2 IMPLANT
GOWN STRL REUS W/ TWL XL LVL3 (GOWN DISPOSABLE) ×2 IMPLANT
GOWN STRL REUS W/TWL XL LVL3 (GOWN DISPOSABLE) ×2
IV NS IRRIG 3000ML ARTHROMATIC (IV SOLUTION) ×2 IMPLANT
KIT ANCHOR FBRTK 2.6 STR (KITS) IMPLANT
KIT BASIN OR (CUSTOM PROCEDURE TRAY) ×1 IMPLANT
KIT TURNOVER KIT A (KITS) IMPLANT
MANIFOLD NEPTUNE II (INSTRUMENTS) ×1 IMPLANT
NDL 1/2 CIR CATGUT .05X1.09 (NEEDLE) IMPLANT
NDL HD SCORPION MEGA LOADER (NEEDLE) IMPLANT
NDL SAFETY ECLIPSE 18X1.5 (NEEDLE) IMPLANT
NEEDLE 1/2 CIR CATGUT .05X1.09 (NEEDLE)
PACK ARTHROSCOPY WL (CUSTOM PROCEDURE TRAY) ×1 IMPLANT
PROBE BIPOLAR ATHRO 135MM 90D (MISCELLANEOUS) IMPLANT
SLEEVE ARM SUSPENSION SYSTEM (MISCELLANEOUS) ×1 IMPLANT
SLING S3 LATERAL DISP (MISCELLANEOUS) ×1 IMPLANT
SLING ULTRA II L (ORTHOPEDIC SUPPLIES) IMPLANT
SPONGE T-LAP 4X18 ~~LOC~~+RFID (SPONGE) IMPLANT
STRIP CLOSURE SKIN 1/2X4 (GAUZE/BANDAGES/DRESSINGS) IMPLANT
SUT 2 FIBERLOOP 20 STRT BLUE (SUTURE)
SUT FIBERWIRE #2 38 T-5 BLUE (SUTURE)
SUT MNCRL AB 3-0 PS2 18 (SUTURE) IMPLANT
SUT MNCRL AB 3-0 PS2 27 (SUTURE) ×1 IMPLANT
SUT PDS AB 0 CT1 36 (SUTURE) IMPLANT
SUT TIGER TAPE 7 IN WHITE (SUTURE) IMPLANT
SUT VIC AB 0 CT1 36 (SUTURE) IMPLANT
SUT VIC AB 2-0 CT1 27 (SUTURE)
SUT VIC AB 2-0 CT1 TAPERPNT 27 (SUTURE) IMPLANT
SUTURE 2 FIBERLOOP 20 STRT BLU (SUTURE) IMPLANT
SUTURE FIBERWR #2 38 T-5 BLUE (SUTURE) IMPLANT
SYR 27GX1/2 1ML LL SAFETY (SYRINGE) IMPLANT
TAPE CLOTH SURG 4X10 WHT LF (GAUZE/BANDAGES/DRESSINGS) IMPLANT
TAPE CLOTH SURG 6X10 NS LF (GAUZE/BANDAGES/DRESSINGS) ×1 IMPLANT
TOWEL OR 17X26 10 PK STRL BLUE (TOWEL DISPOSABLE) ×1 IMPLANT
TUBING ARTHROSCOPY IRRIG 16FT (MISCELLANEOUS) ×2 IMPLANT
TUBING CONNECTING 10 (TUBING) ×2 IMPLANT
WAND ABLATOR APOLLO I90 (BUR) ×1 IMPLANT
WATER STERILE IRR 500ML POUR (IV SOLUTION) ×1 IMPLANT

## 2023-07-05 NOTE — Anesthesia Procedure Notes (Addendum)
Anesthesia Regional Block: Popliteal block   Pre-Anesthetic Checklist: , timeout performed,  Correct Patient, Correct Site, Correct Laterality,  Correct Procedure, Correct Position, site marked,  Risks and benefits discussed,  Surgical consent,  Pre-op evaluation,  At surgeon's request and post-op pain management  Laterality: Right  Prep: chloraprep       Needles:  Injection technique: Single-shot  Needle Type: Echogenic Stimulator Needle     Needle Length: 9cm  Needle Gauge: 21     Additional Needles:   Procedures:,,,, ultrasound used (permanent image in chart),,    Narrative:  Start time: 07/05/2023 6:53 AM End time: 07/05/2023 6:55 AM Injection made incrementally with aspirations every 5 mL.  Performed by: Personally  Anesthesiologist: [place] Nation, MD  Additional Notes: Discussed risks and benefits of the nerve block in detail, including but not limited vascular injury, permanent nerve damage and infection.   Patient tolerated the procedure well. Local anesthetic introduced in an incremental fashion under minimal resistance after negative aspirations. No paresthesias were elicited. After completion of the procedure, no acute issues were identified and patient continued to be monitored by RN.

## 2023-07-05 NOTE — Anesthesia Postprocedure Evaluation (Signed)
Anesthesia Post Note  Patient: Jon Rodriguez  Procedure(s) Performed: SHOULDER ARTHROSCOPY WITH ROTATOR CUFF REPAIR WITH EXTENSIVE DEBRIDEMENT (Right: Shoulder) SHOULDER ARTHROSCOPY WITH SUBACROMIAL DECOMPRESSION (Right: Shoulder)     Patient location during evaluation: PACU Anesthesia Type: Regional and General Level of consciousness: awake and alert Pain management: pain level controlled Vital Signs Assessment: post-procedure vital signs reviewed and stable Respiratory status: spontaneous breathing, nonlabored ventilation, respiratory function stable and patient connected to nasal cannula oxygen Cardiovascular status: blood pressure returned to baseline and stable Postop Assessment: no apparent nausea or vomiting Anesthetic complications: no   No notable events documented.  Last Vitals:  Vitals:   07/05/23 0927 07/05/23 0930  BP: (!) 148/91 (!) 153/95  Pulse: 74 73  Resp:  19  Temp:    SpO2: 97% 96%    Last Pain:  Vitals:   07/05/23 0930  PainSc: 0-No pain                 [place] Nation

## 2023-07-05 NOTE — Op Note (Signed)
Date of Surgery: 07/05/2023  INDICATIONS: Jon Rodriguez is a 62 y.o.-year-old male with a right rotator cuff tear, biceps tendon long head tear, as well as impingement;  The patient did consent to the procedure after discussion of the risks and benefits.  PREOPERATIVE DIAGNOSIS:  1.  Right shoulder rotator cuff tear, complete. 2.  Right shoulder long head of biceps tendon tear 3.  Right shoulder subacromial impingement 4.  Right shoulder degenerative glenoid labral tearing  POSTOPERATIVE DIAGNOSIS:  1.  Right shoulder rotator cuff tear, complete. 2.  Right shoulder long head of biceps tendon tear 3.  Right shoulder subacromial impingement 4.  Right shoulder degenerative glenoid labral tearing 5.  Right shoulder glenohumeral chondromalacia grade 4  PROCEDURE:  1.  Right shoulder arthroscopic extensive debridement of anterior labrum, superior labrum, rotator interval, biceps tenotomy as well as subacromial bursectomy 2.  Right shoulder arthroscopic rotator cuff repair 3.  Right shoulder arthroscopic subacromial decompression  SURGEON: Maryan Rued, M.D.  ASSIST: Dion Saucier, PA-C  Assistant attestation:  PA McClung present for the entire procedure..  ANESTHESIA:  general, interscalene block with Exparel  IV FLUIDS AND URINE: See anesthesia.  ESTIMATED BLOOD LOSS: 20 mL.  IMPLANTS:  Arthrex 2.6 mm rotator cuff fiber tack anchor x 2 for medial row Arthrex 4.75 mm swivel lock anchor x 2 for lateral row  DRAINS: None  COMPLICATIONS: None.  DESCRIPTION OF PROCEDURE: The patient was brought to the operating room and placed on the operating table.  The patient had been signed prior to the procedure and this was documented. The patient had the anesthesia placed by the anesthesiologist.  A time-out was performed to confirm that this was the correct patient, site, side and location. The patient did receive antibiotics prior to the incision and was re-dosed during the procedure as  needed at indicated intervals.  A tourniquet was not placed.  The patient had the operative extremity prepped and draped in the standard surgical fashion.      After obtaining informed consent the patient was brought to the operating table and underwent satisfactory anesthesia. An exam under anesthesia revealed full range of motion. Jon Rodriguez was placed in the left lateral decubitus position with an axillary roll and all bony prominences properly padded. A standard surgical timeout was performed. Jon Rodriguez was placed in 10 pounds of gentle in-line suspension.  Standard posterior and anterior superior portals were established. A diagnostic evaluation of the glenohumeral joint was performed. The biceps tendon was markedly synovitic and partially torn, it was very flattened consistent with likely longstanding supraspinatus tearing.. It was tenotomized. An extensive debridement was performed of the superior anterior and posterior labrum.  The chondral surfaces of the humeral head demonstrated a geographic area on the posterior humeral head of grade IV chondromalacia.  This is a circular region measuring approximately 2 cm x 2 cm.  On the glenoid side there was generalized grade 2 change.. The subscapularis was intact. The rotator cuff was torn and the greater tuberosity footprint was prepared for repair. Releases were performed on the capsular surface of the rotator cuff.  The arthroscope was inserted in the subacromial space and an additional lateral portal was established. An acromioplasty performed nicely decompressing the subacromial space with a motorized burr. Bursitis in the subacromial space was removed as well as releases were performed on the bursal surface of the rotator cuff.      Next cannulas were placed appropriately.  Rotator cuff tear was identified.  This was a classic crescent  tear involving the entirety of the supraspinatus.  2 cm x 2 cm.  Tendon quality was marginal there was some thinning noted  anteriorly along the rotator cable.  Through an accessory lateral portal 2 separate 2.6 mm all suture fiber tack anchors were placed along the articular margin medially.  These were each preloaded with a single fiber tape as well as a single suture tape.  We passed the separately for a total of 4 suture limbs per anchor.  Next, we divided the 8 suture limbs and to 2 separate clusters of 4.  These were then passed into our lateral row anchors.  These were 4.75 mm self punching swivel lock anchors.  Care was taken to tension the sutures as we inserted the anchor and we felt as though we had good compression of tendon to bone.  At the completion of the repair there were no dogears.  Tendon move nicely as a single unit with the bone when the humeral head was taken through internal and external rotation.  The arthroscope was then removed and portals closed with 3-0 Monocryl in standard fashion followed by a sterile occlusive dressing Polar Care ice sleeve and a slingshot sling. The patient was sent to recovery in stable condition and tolerated the procedure well  POSTOPERATIVE PLAN:  Jon Rodriguez will be monitored in PACU closely for any cardiac issues.  As long as Jon Rodriguez emerges smoothly will discharge home today from PACU.  Jon Rodriguez will follow-up with me in the office in 2 weeks.  Jon Rodriguez will be on the less than 3 cm rotator cuff repair protocol.

## 2023-07-05 NOTE — Transfer of Care (Signed)
Immediate Anesthesia Transfer of Care Note  Patient: Ayvin Lipinski  Procedure(s) Performed: SHOULDER ARTHROSCOPY WITH ROTATOR CUFF REPAIR WITH EXTENSIVE DEBRIDEMENT (Right: Shoulder) SHOULDER ARTHROSCOPY WITH SUBACROMIAL DECOMPRESSION (Right: Shoulder)  Patient Location: PACU  Anesthesia Type:General  Level of Consciousness: awake, alert , and patient cooperative  Airway & Oxygen Therapy: Patient Spontanous Breathing and Patient connected to face mask oxygen  Post-op Assessment: Report given to RN and Post -op Vital signs reviewed and stable  Post vital signs: Reviewed and stable  Last Vitals:  Vitals Value Taken Time  BP 145/98 07/05/23 0845  Temp    Pulse 81 07/05/23 0846  Resp 21 07/05/23 0846  SpO2 99 % 07/05/23 0846  Vitals shown include unfiled device data.  Last Pain:  Vitals:   07/05/23 0558  PainSc: 0-No pain         Complications: No notable events documented.

## 2023-07-05 NOTE — Discharge Instructions (Addendum)
Orthopedic surgery discharge instructions:  -Maintain postoperative bandages for 3 days.  You may remove these bandages on post op day 3 and begin showering at that time.  Please do not submerge underwater.  -Maintain your arm in sling at all times.  You should only remove for showering and getting dressed.  No lifting with the operative arm.  -For mild to moderate pain use Tylenol and Advil in alternating fashion around-the-clock.  For breakthrough pain use hydrocodone as necessary.  -Please apply ice to the right shoulder for 20-30 minutes out of each hour that you are awake.  Do this around-the-clock for the first 3 days from surgery.  -Follow-up in 2 weeks for routine postoperative check.

## 2023-07-05 NOTE — Brief Op Note (Signed)
07/05/2023  8:32 AM  PATIENT:  Jon Rodriguez  62 y.o. male  PRE-OPERATIVE DIAGNOSIS:  Right shoulder rotator cuff tear, impingement, biceps tear  POST-OPERATIVE DIAGNOSIS:  Right shoulder rotator cuff tear, impingement, biceps tear  PROCEDURE:  Procedure(s) with comments: SHOULDER ARTHROSCOPY WITH ROTATOR CUFF REPAIR WITH EXTENSIVE DEBRIDEMENT (Right) - 120 SHOULDER ARTHROSCOPY WITH SUBACROMIAL DECOMPRESSION (Right) - 120  SURGEON:  Surgeons and Role:    Aundria Rud, Noah Delaine, MD - Primary  PHYSICIAN ASSISTANT: Dion Saucier, PA-C  ANESTHESIA:   regional and general  EBL:  20 mL   BLOOD ADMINISTERED:none  DRAINS: none   LOCAL MEDICATIONS USED:  NONE  SPECIMEN:  No Specimen  DISPOSITION OF SPECIMEN:  N/A  COUNTS:  YES  TOURNIQUET:  * No tourniquets in log *  DICTATION: .Note written in EPIC  PLAN OF CARE: Discharge to home after PACU  PATIENT DISPOSITION:  PACU - hemodynamically stable.   Delay start of Pharmacological VTE agent (>24hrs) due to surgical blood loss or risk of bleeding: not applicable

## 2023-07-05 NOTE — H&P (Signed)
ORTHOPAEDIC H and P  REQUESTING PHYSICIAN: Yolonda Kida, MD  PCP:  Daisy Floro, MD  Chief Complaint: Right shoulder pain  HPI: Jon Rodriguez is a 62 y.o. male who complains of  right shoulder pain and weakness, and is here today for right shoulder surgery.  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   Past Surgical History:  Procedure Laterality Date   AORTIC VALVE REPLACEMENT     carotid endartarectomy      COLONOSCOPY  2012   KNEE ARTHROSCOPY WITH MEDIAL MENISECTOMY Left 07/20/2022   Procedure: KNEE ARTHROSCOPY WITH PARTIAL MEDIAL MENISECTOMY;  Surgeon: Yolonda Kida, MD;  Location: [facility] OR[facility];  Service: Orthopedics;  Laterality: Left;  75   KNEE ARTHROSCOPY WITH SUBCHONDROPLASTY Left 07/20/2022   Procedure: KNEE ARTHROSCOPY WITH MEDIAL TIBIA SUBCHONDROPLASTY;  Surgeon: Yolonda Kida, MD;  Location: [facility];  Service: Orthopedics;  Laterality: Left;  75   REPLACEMENT ASCENDING AORTA N/A 03/31/2016   Procedure: REPLACEMENT OF ASCENDING AORTA USING A 30mm HEMASHIELD PLATINUM VASCULAR GRAFT;  Surgeon: Kerin Perna, MD;  Location: [facility] OR;  Service: Open Heart Surgery;  Laterality: N/A;   stent placed in abdomen      subclavian stenosis      Social History   Socioeconomic History   Marital status: Divorced    Spouse name: Not on file   Number of children: Not on file   Years of education: Not on file   Highest education level: Not on file  Occupational History   Not on file  Tobacco Use   Smoking status: Former    Types: Cigarettes   Smokeless tobacco: Former  Building services engineer status: Never Used  Substance and  Sexual Activity   Alcohol use: Yes    Comment: rare   Drug use: Never   Sexual activity: Not on file  Other Topics Concern   Not on file  Social History Narrative   Not on file   Social Determinants of Health   Financial Resource Strain: Not on file  Food Insecurity: Not on file  Transportation Needs: Not on file  Physical Activity: Not on file  Stress: Not on file  Social Connections: Not on file   Family History  Problem Relation Age of Onset   Hypertension Other        SIblings   CAD Mother        CABG   Hyperlipidemia Mother    Hypertension Father    Prostate cancer Father    Hyperlipidemia Father    Allergies  Allergen Reactions   Quinolones Other (See Comments)    Fluoroquinolone antibiotics should be avoided in patients with history of aortic aneurysm if alternative therapy is available  ([email or web address])   Simvastatin     myalgias   Prior to Admission medications   Medication Sig Start Date End Date Taking? Authorizing Provider  acetaminophen (TYLENOL) 500 MG tablet Take 500-1,000 mg by mouth every 6 (six) hours as needed (pain.).   Yes [provider]  amoxicillin (AMOXIL) 500 MG tablet Take 4 tablets (2,000 mg total) by mouth as  needed (30-60 minutes before any dental procedure). 07/18/22  Yes Sharlene Dory, NP  aspirin 81 MG EC tablet Take 1 tablet (81 mg total) by mouth daily. 07/25/18  Yes Croitoru, Mihai, MD  Aspirin-Caffeine (BC FAST PAIN RELIEF PO) Take 1 packet by mouth daily as needed (pain.).   Yes [provider]  carvedilol (COREG) 12.5 MG tablet Take 2 tablets (25 mg total) by mouth 2 (two) times daily with a meal. Patient taking differently: Take 12.5 mg by mouth 2 (two) times daily with a meal. 02/01/23  Yes Croitoru, Mihai, MD  Cholecalciferol (VITAMIN D3) 50 MCG (2000 UT) TABS Take 2,000 Units by mouth in the morning.   Yes [provider]  ferrous sulfate 325 (65 FE) MG tablet Take  325 mg by mouth every evening.   Yes [provider]  hydrochlorothiazide (HYDRODIURIL) 25 MG tablet Take 1 tablet (25 mg total) by mouth daily. 09/14/22  Yes Croitoru, Mihai, MD  Multiple Vitamins-Minerals (MULTIVITAMIN WITH MINERALS) tablet Take 1 tablet by mouth in the morning.   Yes [provider]  olmesartan (BENICAR) 40 MG tablet Take 1 tablet (40 mg total) by mouth daily. 10/17/22  Yes Croitoru, Mihai, MD  Omega-3 1000 MG CAPS Take 1,000 mg by mouth every evening.   Yes [provider]  rosuvastatin (CRESTOR) 20 MG tablet Take 1 tablet (20 mg total) by mouth daily. 11/13/22  Yes Croitoru, Mihai, MD  sildenafil (VIAGRA) 100 MG tablet Take 100 mg by mouth daily as needed for erectile dysfunction.   Yes [provider]  tamsulosin (FLOMAX) 0.4 MG CAPS capsule Take 0.4 mg by mouth every evening. 06/05/22  Yes [provider]  testosterone cypionate (DEPOTESTOTERONE CYPIONATE) 100 MG/ML injection Inject 78 mg into the skin 2 (two) times a week. Mondays & Thursdays.   Yes [provider]  Tirzepatide-Weight Management (ZEPBOUND [place]) Inject 9.5 mg into the skin every Thursday.   Yes [provider]   No results found.  Positive ROS: All other systems have been reviewed and were otherwise negative with the exception of those mentioned in the HPI and as above.  Physical Exam: General: Alert, no acute distress Cardiovascular: No pedal edema Respiratory: No cyanosis, no use of accessory musculature GI: No organomegaly, abdomen is soft and non-tender Skin: No lesions in the area of chief complaint Neurologic: Sensation intact distally Psychiatric: Patient is competent for consent with normal mood and affect Lymphatic: No axillary or cervical lymphadenopathy  MUSCULOSKELETAL: RUE- wwp, nvi  Assessment: Right shoulder rotator cuff tear Right shoulder impingement Right shoulder biceps tendinitis   Plan: Plan to proceed today with  right shoulder arthroscopy with rotator cuff repair, biceps tenodesis and debridements as indicated.  Informed conmsent obtained after discussion of risks and benefits  Plan for dc home post op    Yolonda Kida, MD Cell [phone]    07/05/2023 6:20 AM

## 2023-07-05 NOTE — Anesthesia Procedure Notes (Signed)
Procedure Name: Intubation Date/Time: 07/05/2023 7:27 AM  Performed by: Sindy Guadeloupe, CRNAPre-anesthesia Checklist: Patient identified, Emergency Drugs available, Suction available, Patient being monitored and Timeout performed Patient Re-evaluated:Patient Re-evaluated prior to induction Oxygen Delivery Method: Circle system utilized Preoxygenation: Pre-oxygenation with 100% oxygen Induction Type: IV induction Ventilation: Mask ventilation without difficulty Laryngoscope Size: Mac and 4 Grade View: Grade II Tube type: Oral Tube size: 7.5 mm Number of attempts: 1 Airway Equipment and Method: Stylet Placement Confirmation: ETT inserted through vocal cords under direct vision, positive ETCO2 and breath sounds checked- equal and bilateral Secured at: 23 cm Tube secured with: Tape Dental Injury: Teeth and Oropharynx as per pre-operative assessment

## 2023-07-08 ENCOUNTER — Encounter (HOSPITAL_COMMUNITY): Payer: Self-pay | Admitting: Orthopedic Surgery

## 2023-07-12 DIAGNOSIS — M25511 Pain in right shoulder: Secondary | ICD-10-CM | POA: Diagnosis not present

## 2023-07-16 DIAGNOSIS — Z0000 Encounter for general adult medical examination without abnormal findings: Secondary | ICD-10-CM | POA: Diagnosis not present

## 2023-07-16 DIAGNOSIS — N400 Enlarged prostate without lower urinary tract symptoms: Secondary | ICD-10-CM | POA: Diagnosis not present

## 2023-07-16 DIAGNOSIS — Z23 Encounter for immunization: Secondary | ICD-10-CM | POA: Diagnosis not present

## 2023-07-16 DIAGNOSIS — Z6831 Body mass index (BMI) 31.0-31.9, adult: Secondary | ICD-10-CM | POA: Diagnosis not present

## 2023-07-16 DIAGNOSIS — N529 Male erectile dysfunction, unspecified: Secondary | ICD-10-CM | POA: Diagnosis not present

## 2023-07-17 DIAGNOSIS — L821 Other seborrheic keratosis: Secondary | ICD-10-CM | POA: Diagnosis not present

## 2023-07-17 DIAGNOSIS — L814 Other melanin hyperpigmentation: Secondary | ICD-10-CM | POA: Diagnosis not present

## 2023-07-17 DIAGNOSIS — D2371 Other benign neoplasm of skin of right lower limb, including hip: Secondary | ICD-10-CM | POA: Diagnosis not present

## 2023-07-17 DIAGNOSIS — L905 Scar conditions and fibrosis of skin: Secondary | ICD-10-CM | POA: Diagnosis not present

## 2023-07-17 DIAGNOSIS — D2372 Other benign neoplasm of skin of left lower limb, including hip: Secondary | ICD-10-CM | POA: Diagnosis not present

## 2023-08-07 DIAGNOSIS — M25511 Pain in right shoulder: Secondary | ICD-10-CM | POA: Diagnosis not present

## 2023-08-18 ENCOUNTER — Other Ambulatory Visit: Payer: Self-pay | Admitting: Cardiovascular Disease

## 2023-09-01 ENCOUNTER — Encounter: Payer: Self-pay | Admitting: Cardiovascular Disease

## 2023-09-01 ENCOUNTER — Other Ambulatory Visit: Payer: Self-pay | Admitting: Cardiovascular Disease

## 2023-09-02 MED ORDER — CARVEDILOL 6.25 MG PO TABS: 6.2500 mg | ORAL_TABLET | Freq: Two times a day (BID) | ORAL | Status: DC

## 2023-09-02 NOTE — Telephone Encounter (Signed)
Dose change updated in chart

## 2023-09-02 NOTE — Telephone Encounter (Signed)
Go ahead and reduce the carvedilol to 6.25 mg daily. Please check BP and HR once daily and bring the log to his appt  next week.

## 2023-09-02 NOTE — Addendum Note (Signed)
Addended by: Scheryl Marten on: 09/02/2023 01:21 PM   Modules accepted: Orders

## 2023-09-11 ENCOUNTER — Encounter: Payer: Self-pay | Admitting: Cardiovascular Disease

## 2023-09-11 ENCOUNTER — Ambulatory Visit: Payer: 59 | Attending: Cardiovascular Disease | Admitting: Cardiovascular Disease

## 2023-09-11 VITALS — BP 151/83 | HR 78 | Ht 72.0 in | Wt 222.4 lb

## 2023-09-11 DIAGNOSIS — Z8679 Personal history of other diseases of the circulatory system: Secondary | ICD-10-CM

## 2023-09-11 DIAGNOSIS — I10 Essential (primary) hypertension: Secondary | ICD-10-CM | POA: Diagnosis not present

## 2023-09-11 DIAGNOSIS — Z953 Presence of xenogenic heart valve: Secondary | ICD-10-CM | POA: Diagnosis not present

## 2023-09-11 DIAGNOSIS — Z9889 Other specified postprocedural states: Secondary | ICD-10-CM | POA: Diagnosis not present

## 2023-09-11 DIAGNOSIS — Z6830 Body mass index (BMI) 30.0-30.9, adult: Secondary | ICD-10-CM

## 2023-09-11 DIAGNOSIS — I441 Atrioventricular block, second degree: Secondary | ICD-10-CM

## 2023-09-11 DIAGNOSIS — E66811 Obesity, class 1: Secondary | ICD-10-CM | POA: Diagnosis not present

## 2023-09-11 DIAGNOSIS — E7800 Pure hypercholesterolemia, unspecified: Secondary | ICD-10-CM | POA: Diagnosis not present

## 2023-09-11 MED ORDER — CARVEDILOL 6.25 MG PO TABS: 6.2500 mg | ORAL_TABLET | Freq: Two times a day (BID) | ORAL | 3 refills | Status: DC

## 2023-09-11 MED ORDER — HYDROCHLOROTHIAZIDE 25 MG PO TABS: 25.0000 mg | ORAL_TABLET | Freq: Every day | ORAL | 3 refills | Status: DC

## 2023-09-11 MED ORDER — OLMESARTAN MEDOXOMIL 40 MG PO TABS: 40.0000 mg | ORAL_TABLET | Freq: Every day | ORAL | 3 refills | Status: DC

## 2023-09-11 MED ORDER — ROSUVASTATIN CALCIUM 20 MG PO TABS: 20.0000 mg | ORAL_TABLET | Freq: Every day | ORAL | 3 refills | Status: DC

## 2023-09-11 NOTE — Patient Instructions (Signed)

## 2023-09-11 NOTE — Progress Notes (Signed)
Cardiology Office  Note    Date:  09/11/2023   ID:  Jon Rodriguez, DOB 1961-07-07, MRN [id number]  PCP:  Daisy Floro, MD  Cardiologist:  Thurmon Fair, MD  Electrophysiologist:  None   Evaluation Performed:  Follow-Up Visit  Chief Complaint:  Aortic aneurysm   History of Present Illness:    [name] is a 62 y.o. male with acute type a aortic dissection in 2017 with emergency surgical repair (supra coronary ascending aorta and proximal arch replacement, 2017 Dr. Donata Clay), subsequent progression of extensive aneurysm of the ascending aorta, aortic arch and descending aorta requiring two-stage surgical repair by Dr. 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  He has well-positioned thoracic stent grafts and widely patent carotid-carotid and aorta-left axillary bypass grafts.  The thoracic false lumen has completely thrombosed.  He has known residual dissection involving the left renal artery and the left common iliac artery. AVR 29 mm Edwards Perimount.  His most recent follow-up imaging studies were at [facility] on 07/13/2022.  Echo shows normal LV function, unchanged LVH, normal function of the aortic valve bioprosthesis with mean gradient of 9 mmHg, dimensionless index 0.54 and no evidence of aortic insufficiency. CT Findings have not changed.  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  The abdominal aorta is mildly  dilated up to 3.6 cm, with an unchanged persistent dissection that extends into the left renal artery.  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  His next imaging follow-up has been scheduled in 2 years.  Although he was not symptomatic, he noticed that his heart rate was occasionally dipping into the 40s a few weeks ago.  After talking to him it became apparent that he was only taking the carvedilol once daily by mistake.  We subsequently changed him to half of the previous dose of carvedilol taken twice daily.  He has not had any more episodes of significant bradycardia 150 bpm.  His blood pressure has generally been in the 130s/70s and his heart rate in the high 50s since making that change.  He is only taking maximum doses of olmesartan and is on hydrochlorothiazide.  Recent blood work showed mild hyponatremia but otherwise normal electrolytes and normal renal function parameters.  He has managed to keep weight off and he continues to exercise at the gym with no complaints.  His BMI is down to 30, from 37 at his last appointment. The patient specifically denies any chest pain at rest or with exertion, dyspnea at rest or with exertion, orthopnea, paroxysmal nocturnal dyspnea, syncope, palpitations, focal neurological deficits, intermittent claudication, lower extremity edema, unexplained weight gain, cough, hemoptysis or wheezing.   Clinically Jon Rodriguez is doing quite well.  He has no cardiovascular complaints.  He has intermittent left eye blepharospasm and is due to see neurology (Dr. Terrace Arabia) on December 11.     History  of aortic aneurysm surgery - acute type A ascending aortic dissection with primary intimal tear close to the coronary ostia in 2017, repaired using a 30  mm Hemashield graft to the proximal arch and resuspension of the aortic valve for aortic insufficiency (Dr. Donata Clay, July  2017) - Residual dilation of the aortic root to 5.2 cm with aortic insufficiency - Continued expansion of the aorta distal to the repair, diameter 6.9 cm, new breakdown along the medial aspect of the distal anastomosis with a small contained pseudoaneurysm up to 1.4 cm and surrounding intramural hematoma.  - subsequent two-stage surgery by Dr. Kizzie Bane at [facility] for aortic aneurysm (type II hybrid arch repair).   - 01/01/2019 redo  supra coronary ascending aortic graft  (Wheat) 29 mm Carpentier-Edwards Perimount stented bovine pericardial valve, coronary reconstruction, transverse aorta placement with graft replacement, aorta to left axillary bypass. 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  Follow-up CT shows resolution of the subclavian endoleak and shows very small type II endoleak via the intercostal arteries.   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   Past Surgical History:  Procedure Laterality Date   AORTIC VALVE REPLACEMENT     carotid endartarectomy      COLONOSCOPY  2012   KNEE ARTHROSCOPY WITH MEDIAL MENISECTOMY Left 07/20/2022   Procedure: KNEE ARTHROSCOPY WITH PARTIAL MEDIAL MENISECTOMY;  Surgeon: Yolonda Kida, MD;  Location: [facility];  Service: Orthopedics;  Laterality: Left;  75   KNEE ARTHROSCOPY WITH  SUBCHONDROPLASTY Left 07/20/2022   Procedure: KNEE ARTHROSCOPY WITH MEDIAL TIBIA SUBCHONDROPLASTY;  Surgeon: Yolonda Kida, MD;  Location: [facility];  Service: Orthopedics;  Laterality: Left;  75   REPLACEMENT ASCENDING AORTA N/A 03/31/2016   Procedure: REPLACEMENT OF ASCENDING AORTA USING A 30mm HEMASHIELD PLATINUM VASCULAR GRAFT;  Surgeon: Kerin Perna, MD;  Location: [facility] OR;  Service: Open Heart Surgery;  Laterality: N/A;   SHOULDER ARTHROSCOPY WITH ROTATOR CUFF REPAIR Right 07/05/2023   Procedure: SHOULDER ARTHROSCOPY WITH ROTATOR CUFF REPAIR WITH EXTENSIVE DEBRIDEMENT;  Surgeon: Yolonda Kida, MD;  Location: [facility];  Service: Orthopedics;  Laterality: Right;  120   SHOULDER ARTHROSCOPY WITH SUBACROMIAL DECOMPRESSION Right 07/05/2023   Procedure: SHOULDER ARTHROSCOPY WITH SUBACROMIAL DECOMPRESSION;  Surgeon: Yolonda Kida, MD;  Location: [facility];  Service: Orthopedics;  Laterality: Right;  120   stent placed in abdomen      subclavian stenosis        Current Meds  Medication Sig   aspirin 81 MG EC tablet Take 1 tablet (81 mg total) by mouth daily.   Multiple Vitamins-Minerals (MULTIVITAMIN WITH MINERALS) tablet Take 1 tablet by mouth in the morning.   Omega-3 1000 MG CAPS Take 1,000 mg by mouth every evening.   sildenafil (VIAGRA) 100 MG tablet Take 100 mg by  mouth daily as needed for erectile dysfunction.   tamsulosin (FLOMAX) 0.4 MG CAPS capsule Take 0.4 mg by mouth every evening.   testosterone cypionate (DEPOTESTOTERONE CYPIONATE) 100 MG/ML injection Inject 78 mg into the skin 2 (two) times a week. Mondays & Thursdays.   Tirzepatide-Weight Management (ZEPBOUND [place]) Inject 9.5 mg into the skin every Thursday.   [DISCONTINUED] carvedilol (COREG) 6.25 MG tablet Take 1 tablet (6.25 mg total) by mouth 2 (two) times daily with a meal.   [DISCONTINUED] hydrochlorothiazide (HYDRODIURIL) 25 MG tablet TAKE 1 TABLET DAILY   [DISCONTINUED] olmesartan (BENICAR) 40 MG tablet  Take 1 tablet (40 mg total) by mouth daily.   [DISCONTINUED] rosuvastatin (CRESTOR) 20 MG tablet Take 1 tablet (20 mg total) by mouth daily.     Allergies:   Quinolones and Simvastatin   Social History   Tobacco Use   Smoking status: Former    Types: Cigarettes   Smokeless tobacco: Former  Building services engineer status: Never Used  Substance Use Topics   Alcohol use: Yes    Comment: rare   Drug use: Never     Family Hx: The patient's family history includes CAD in his mother; Hyperlipidemia in his father and mother; Hypertension in his father and another family member; Prostate cancer in his father.  ROS:   Please see the history of present illness.    All other systems reviewed and are negative.  Prior CV studies:   The following studies were reviewed today:  CT angiogram of the aorta 07/13/2022, [facility]: 1.  Stable thoracic aorta post surgical changes with unchanged diameter of  dilated sinuses of Valsalva (measuring up to 5.0 cm) and the thoracic aorta  measuring up to 5.8 cm at the distal aortic arch. No evidence of  postoperative complications or endoleak.  2.  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  3.  Similar, mild hypoattenuating leaflet thickening (HALT) of the  bioprosthetic aortic valve.    Echocardiogram 07/13/2022, [facility]: NORMAL LEFT VENTRICULAR SYSTOLIC FUNCTION WITH MODERATE LVH    NORMAL RIGHT VENTRICULAR SYSTOLIC FUNCTION    VALVULAR REGURGITATION: TRIVIAL MR, TRIVIAL PR, TRIVIAL TR    PROSTHETIC VALVE(S): BIOPROSTHETIC AoV    3D acquisition and reconstructions were performed as part of this    examination to more accurately quantify the effects of identified    structural abnormalities as part of the exam. (post-processing on an    Independent workstation).     S/P AVR; NO AORTIC REGURGITATION TODAY    Aortic: No AR                  BIOPROSTHETIC AoV      2.0 m/s peak vel   17 mmHg  peak grad   9 mmHg mean grad 1.7 cm2 by DOPPLER   LVOT Diam: 2.0 cm. Resting LVOT Vel: 1.1 m/s. Dimensionless Index: 0.54    Labs/Other Tests and Data Reviewed:    EKG:   EKG Interpretation Date/Time:  Wednesday September 11 2023 10:00:05 EST Ventricular Rate:  78 PR Interval:  248 QRS Duration:  126 QT Interval:  432 QTC Calculation: 492 R Axis:   -27  Text Interpretation: Sinus rhythm with 2nd degree A-V block (Mobitz I) Non-specific intra-ventricular conduction block Minimal voltage criteria for LVH, may be normal variant ( Cornell product ) When compared with ECG of 11-Jul-2022 08:37, Nonspecific T wave abnormality has replaced inverted T waves in Lateral leads Confirmed by Myrta Mercer (  [id number]) on 09/11/2023 10:22:42 AM         Recent Labs: 06/26/2023: BUN 10; Creatinine, Ser 0.86; Hemoglobin 17.0; Platelets 206; Potassium 4.1; Sodium 130  07/06/2019 Hemoglobin 13.7, creatinine 0.8, potassium 4.9 Recent Lipid Panel Lab Results  Component Value Date/Time   CHOL 205 (H) 04/06/2022 10:51 AM   TRIG 71 04/06/2022 10:51 AM   HDL 71 04/06/2022 10:51 AM   CHOLHDL 2.9 04/06/2022 10:51 AM   CHOLHDL 3.3 06/11/2016 08:15 AM   LDLCALC 121 (H) 04/06/2022 10:51 AM    Wt Readings from Last 3 Encounters:  09/11/23 222 lb 6.4 oz (100.9 kg)  07/05/23 225 lb (102.1 kg)  06/26/23 225 lb (102.1 kg)     Objective:    Vital Signs:  BP (!) 160/102 (BP Location: Left Arm, Patient Position: Sitting)   Pulse 78   Ht 6' (1.829 m)   Wt 222 lb 6.4 oz (100.9 kg)   SpO2 96%   BMI 30.16 kg/m     General: Alert, oriented x3, no distress, severely obese Head: no evidence of trauma, PERRL, EOMI, no exophtalmos or lid lag, no myxedema, no xanthelasma; normal ears, nose and oropharynx Neck: normal jugular venous pulsations and no hepatojugular reflux; brisk carotid pulses without delay and no carotid bruits Chest: clear to auscultation, no signs of consolidation by percussion or palpation,  normal fremitus, symmetrical and full respiratory excursions Cardiovascular: normal position and quality of the apical impulse, regular rhythm with occasional dropped beats, normal first and second heart sounds, 2/6 musical aortic ejection murmur radiating from the left lower sternal border and right upper sternal border towards the carotids no diastolic murmurs, rubs or gallops Abdomen: no tenderness or distention, no masses by palpation, no abnormal pulsatility or arterial bruits, normal bowel sounds, no hepatosplenomegaly Extremities: no clubbing, cyanosis or edema; 2+ radial, ulnar and brachial pulses bilaterally; 2+ right femoral, posterior tibial and dorsalis pedis pulses; 2+ left femoral, posterior tibial and dorsalis pedis pulses; no subclavian or femoral bruits Neurological: grossly nonfocal Psych: Normal mood and affect     ASSESSMENT & PLAN:    1. Hx of repair of dissecting thoracic aortic aneurysm, Stanford type A   2. History of aortic valve replacement with porcine valve   3. Essential hypertension   4. Mobitz type 1 second degree atrioventricular block   5. Hypercholesterolemia   6. Class 1 obesity with serious comorbidity and body mass index (BMI) of 30.0 to 30.9 in adult, unspecified obesity type       S/P Asc Ao Dissection/aortic aneurysm repair with redo sternotomy: Excellent recovery.  Good results on his recent work-up just last month with Dr. Kizzie Bane.  Next follow-up is planned in late 2025 AVR (bioprosthetic): Echo just performed in October 2023 at [facility] with normal valve function, if needed next year.  Reminded him of the need for endocarditis prophylaxis. HTN: He always has some component of whitecoat hypertension.  We had to decrease the dose of carvedilol due to second-degree AV block and have not increased diuretics due to hyponatremia.  He is on maximum dose olmesartan.  In the past he had edema with amlodipine.  If we need to cut back on the carvedilol further we  will need to add amlodipine.  No changes made today. Arrhythmia:   arrhythmia monitor December 2020 showed rare episodes of nonsustained VT and nonsustained atrial tachycardia, but also showed nocturnal second-degree AV block Mobitz type I with moderate bradycardia.  He is now having AV block during daytime hours,  but without significant bradycardia and he is not symptomatic. HLP: Had myopathy with simvastatin, but seems to be tolerating rosuvastatin well.  Most recent LDL cholesterol was 101.  He has minimal evidence of atherosclerosis, but still would prefer his LDL less than 70.  Weight loss would be beneficial. Prolonged QT interval: This is at least partly due to the intraventricular conduction delay.  Avoid medications that can further prolong the QT interval. Obesity: Has lost substantial weight on Mounjaro, now at the cusp of overweight/obese range.  Patient Instructions  Medication Instructions:  No changes *If you need a refill on your cardiac medications before your next appointment, please call your pharmacy*  Follow-Up: At [facility], you and your health needs are our priority.  As part of our continuing mission to provide you with exceptional heart care, we have created designated Provider Care Teams.  These Care Teams include your primary Cardiologist (physician) and Advanced Practice Providers (APPs -  Physician Assistants and Nurse Practitioners) who all work together to provide you with the care you need, when you need it.  We recommend signing up for the patient portal called "MyChart".  Sign up information is provided on this After Visit Summary.  MyChart is used to connect with patients for Virtual Visits (Telemedicine).  Patients are able to view lab/test results, encounter notes, upcoming appointments, etc.  Non-urgent messages can be sent to your provider as well.   To learn more about what you can do with MyChart, go to [email or web address].    Your next  appointment:   1 year(s)  Provider:   Thurmon Fair, MD            Signed, Thurmon Fair, MD  09/11/2023 11:59 AM    [facility]

## 2023-09-19 DIAGNOSIS — M25511 Pain in right shoulder: Secondary | ICD-10-CM | POA: Diagnosis not present

## 2023-09-23 DIAGNOSIS — M25511 Pain in right shoulder: Secondary | ICD-10-CM | POA: Diagnosis not present

## 2023-10-30 DIAGNOSIS — M25511 Pain in right shoulder: Secondary | ICD-10-CM | POA: Diagnosis not present

## 2023-11-20 DIAGNOSIS — M25511 Pain in right shoulder: Secondary | ICD-10-CM | POA: Diagnosis not present

## 2023-11-26 DIAGNOSIS — Z9889 Other specified postprocedural states: Secondary | ICD-10-CM | POA: Diagnosis not present

## 2024-04-14 DIAGNOSIS — M25461 Effusion, right knee: Secondary | ICD-10-CM | POA: Diagnosis not present

## 2024-04-14 DIAGNOSIS — M25561 Pain in right knee: Secondary | ICD-10-CM | POA: Diagnosis not present

## 2024-05-04 DIAGNOSIS — M25561 Pain in right knee: Secondary | ICD-10-CM | POA: Diagnosis not present

## 2024-05-11 DIAGNOSIS — S83231D Complex tear of medial meniscus, current injury, right knee, subsequent encounter: Secondary | ICD-10-CM | POA: Diagnosis not present

## 2024-06-10 ENCOUNTER — Telehealth: Payer: Self-pay

## 2024-06-10 DIAGNOSIS — S83232D Complex tear of medial meniscus, current injury, left knee, subsequent encounter: Secondary | ICD-10-CM | POA: Diagnosis not present

## 2024-06-10 NOTE — Telephone Encounter (Signed)
   Pre-operative Risk Assessment    Patient Name: Jon Rodriguez  DOB: 04-01-1961 MRN: [id number]   Date of last office visit: 09/11/23 JEREL BALDING, MD Date of next office visit: NONE   Request for Surgical Clearance    Procedure:  RIGHT KNEE SCOPE WITH PARTIAL MENISECTOMY  Date of Surgery:  Clearance TBD                                Surgeon:  DR SELINDA GOSLING Surgeon's Group or Practice Name:  [facility] Phone number:  [phone] Fax number:  [phone]  ATTN: KERRI MAZE   Type of Clearance Requested:   - Medical  - Pharmacy:  Hold Aspirin      Type of Anesthesia:  Not Indicated   Additional requests/questions:    SignedLucie DELENA Ku   06/10/2024, 5:23 PM

## 2024-06-11 ENCOUNTER — Telehealth: Payer: Self-pay

## 2024-06-11 NOTE — Telephone Encounter (Signed)
   Name: Jon Rodriguez  DOB: Oct 16, 1960  MRN: [id number]  Primary Cardiologist: Jerel Balding, MD  Chart reviewed as part of pre-operative protocol coverage. Because of [name]'s past medical history and time since last visit, he will require a follow-up telephone visit in order to better assess preoperative cardiovascular risk.  Pre-op covering staff: - Please schedule appointment and call patient to inform them. If patient already had an upcoming appointment within acceptable timeframe, please add pre-op clearance to the appointment notes so provider is aware. - Please contact requesting surgeon's office via preferred method (i.e, phone, fax) to inform them of need for appointment prior to surgery.  As long as he is asymptomatic at the time of phone call, can hold ASA x 5 to 7 days prior to procedure.  Please resume medically safe to do so.  He lives in [place].  [name] Fabry, PA-C  06/11/2024, 8:15 AM

## 2024-06-11 NOTE — Telephone Encounter (Signed)
Patient has been scheduled for televisit.

## 2024-06-11 NOTE — Telephone Encounter (Signed)
 Patient has been scheduled for televisit med rec and consent done     Patient Consent for Virtual Visit         Jon Rodriguez has provided verbal consent on 06/11/2024 for a virtual visit (video or telephone).   CONSENT FOR VIRTUAL VISIT FOR:  Jon Rodriguez  By participating in this virtual visit I agree to the following:  I hereby voluntarily request, consent and authorize [facility] and its employed or contracted physicians, physician assistants, nurse practitioners or other licensed health care professionals (the Practitioner), to provide me with telemedicine health care services (the "Services) as deemed necessary by the treating Practitioner. I acknowledge and consent to receive the Services by the Practitioner via telemedicine. I understand that the telemedicine visit will involve communicating with the Practitioner through live audiovisual communication technology and the disclosure of certain medical information by electronic transmission. I acknowledge that I have been given the opportunity to request an in-person assessment or other available alternative prior to the telemedicine visit and am voluntarily participating in the telemedicine visit.  I understand that I have the right to withhold or withdraw my consent to the use of telemedicine in the course of my care at any time, without affecting my right to future care or treatment, and that the Practitioner or I may terminate the telemedicine visit at any time. I understand that I have the right to inspect all information obtained and/or recorded in the course of the telemedicine visit and may receive copies of available information for a reasonable fee.  I understand that some of the potential risks of receiving the Services via telemedicine include:  Delay or interruption in medical evaluation due to technological equipment failure or disruption; Information transmitted may not be sufficient (e.g. poor resolution of  images) to allow for appropriate medical decision making by the Practitioner; and/or  In rare instances, security protocols could fail, causing a breach of personal health information.  Furthermore, I acknowledge that it is my responsibility to provide information about my medical history, conditions and care that is complete and accurate to the best of my ability. I acknowledge that Practitioner's advice, recommendations, and/or decision may be based on factors not within their control, such as incomplete or inaccurate data provided by me or distortions of diagnostic images or specimens that may result from electronic transmissions. I understand that the practice of medicine is not an exact science and that Practitioner makes no warranties or guarantees regarding treatment outcomes. I acknowledge that a copy of this consent can be made available to me via my patient portal [facility] MyChart), or I can request a printed copy by calling the office of [facility].    I understand that my insurance will be billed for this visit.   I have read or had this consent read to me. I understand the contents of this consent, which adequately explains the benefits and risks of the Services being provided via telemedicine.  I have been provided ample opportunity to ask questions regarding this consent and the Services and have had my questions answered to my satisfaction. I give my informed consent for the services to be provided through the use of telemedicine in my medical care

## 2024-06-22 ENCOUNTER — Ambulatory Visit: Attending: Internal Medicine | Admitting: Nurse Practitioner

## 2024-06-22 ENCOUNTER — Encounter: Payer: Self-pay | Admitting: Nurse Practitioner

## 2024-06-22 DIAGNOSIS — Z01810 Encounter for preprocedural cardiovascular examination: Secondary | ICD-10-CM | POA: Diagnosis not present

## 2024-06-22 NOTE — Progress Notes (Signed)
 Virtual Visit via Telephone Note   Because of Jon Rodriguez co-morbid illnesses, he is at least at moderate risk for complications without adequate follow up.  This format is felt to be most appropriate for this patient at this time.  Due to technical limitations with video connection (technology), today's appointment will be conducted as an audio only telehealth visit, and Jon Rodriguez verbally agreed to proceed in this manner.   All issues noted in this document were discussed and addressed.  No physical exam could be performed with this format.  Evaluation Performed:  Preoperative cardiovascular risk assessment _____________   Date:  06/22/2024   Patient ID:  Jon Rodriguez, DOB 1960-12-17, MRN [id number] Patient Location:  Home Provider location:   Office  Primary Care Provider:  Okey Carlin Redbird, MD Primary Cardiologist:  Jerel Balding, MD  Chief Complaint / Patient Profile   63 y.o. y/o male with a h/o AVR, aortic dissection acute type a in 2017 with surgical repair, subsequent progression of extensive aneurysm of ascending aorta, aortic arch and descending aorta requiring two-stage surgical repair by Dr. Vinita at [facility] April 2020, hypertension, bradycardia, hyperlipidemia, prolonged QT interval who is pending right knee scope with partial menisectomy on date TBD with Dr. Sharl and presents today for telephonic preoperative cardiovascular risk assessment.  History of Present Illness    Jon Rodriguez is a 63 y.o. male who presents via audio/video conferencing for a telehealth visit today.  Pt was last seen in cardiology clinic on 09/11/23 by Dr. Balding.  At that time Jon Rodriguez was doing well.  The patient is now pending procedure as outlined above. Since his last visit, he denies chest pain, shortness of breath, lower extremity edema, fatigue, palpitations, melena, hematuria, hemoptysis, diaphoresis, weakness, presyncope, syncope, orthopnea, and PND. He remains active  with regular weight lifting and walking and is easily able to complete > 4 METS activity without concerning cardiac symptoms.   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   Past Surgical History:  Procedure Laterality Date   AORTIC VALVE REPLACEMENT     carotid endartarectomy      COLONOSCOPY  2012   KNEE ARTHROSCOPY WITH MEDIAL MENISECTOMY Left 07/20/2022   Procedure: KNEE ARTHROSCOPY WITH PARTIAL MEDIAL MENISECTOMY;  Surgeon: Sharl Selinda Dover, MD;  Location: [facility];  Service: Orthopedics;  Laterality: Left;  75   KNEE ARTHROSCOPY WITH SUBCHONDROPLASTY Left 07/20/2022   Procedure: KNEE ARTHROSCOPY WITH MEDIAL TIBIA SUBCHONDROPLASTY;  Surgeon: Sharl Selinda Dover, MD;  Location: [facility];  Service: Orthopedics;  Laterality: Left;  75   REPLACEMENT ASCENDING AORTA N/A 03/31/2016   Procedure: REPLACEMENT OF ASCENDING AORTA USING A 30mm HEMASHIELD PLATINUM VASCULAR GRAFT;  Surgeon: Maude Fleeta Ochoa, MD;  Location: [facility] OR;  Service: Open Heart Surgery;  Laterality: N/A;   SHOULDER ARTHROSCOPY WITH ROTATOR CUFF REPAIR Right 07/05/2023   Procedure: SHOULDER ARTHROSCOPY WITH ROTATOR CUFF REPAIR WITH EXTENSIVE DEBRIDEMENT;  Surgeon: Sharl Selinda Dover, MD;  Location: [facility];  Service: Orthopedics;  Laterality: Right;  120   SHOULDER ARTHROSCOPY WITH SUBACROMIAL DECOMPRESSION Right 07/05/2023   Procedure:  SHOULDER ARTHROSCOPY WITH SUBACROMIAL DECOMPRESSION;  Surgeon: Sharl Selinda Dover, MD;  Location: [facility];  Service: Orthopedics;  Laterality: Right;  120   stent placed in abdomen      subclavian stenosis       Allergies  Allergies   Allergen Reactions   Quinolones Other (See Comments)    Fluoroquinolone antibiotics should be avoided in patients with history of aortic aneurysm if alternative therapy is available  ([email or web address])   Simvastatin      myalgias    Home Medications    Prior to Admission medications   Medication Sig Start Date End Date Taking? Authorizing Provider  amoxicillin  (AMOXIL ) 500 MG tablet Take 4 tablets (2,000 mg total) by mouth as needed (30-60 minutes before any dental procedure). Patient not taking: Reported on 09/11/2023 07/18/22   Miriam Norris, NP  aspirin  81 MG EC tablet Take 1 tablet (81 mg total) by mouth daily. 07/25/18   Croitoru, Mihai, MD  carvedilol  (COREG ) 6.25 MG tablet Take 1 tablet (6.25 mg total) by mouth 2 (two) times daily with a meal. 09/11/23   Croitoru, Mihai, MD  hydrochlorothiazide  (HYDRODIURIL ) 25 MG tablet Take 1 tablet (25 mg total) by mouth daily. 09/11/23   Croitoru, Mihai, MD  Multiple Vitamins-Minerals (MULTIVITAMIN WITH MINERALS) tablet Take 1 tablet by mouth in the morning.    [provider]  olmesartan  (BENICAR ) 40 MG tablet Take 1 tablet (40 mg total) by mouth daily. 09/11/23   Croitoru, Mihai, MD  Omega-3 1000 MG CAPS Take 1,000 mg by mouth every evening.    [provider]  rosuvastatin  (CRESTOR ) 20 MG tablet Take 1 tablet (20 mg total) by mouth daily. 09/11/23   Croitoru, Mihai, MD  sildenafil (VIAGRA) 100 MG tablet Take 100 mg by mouth daily as needed for erectile dysfunction.    [provider]  tamsulosin  (FLOMAX ) 0.4 MG CAPS capsule Take 0.4 mg by mouth every evening. 06/05/22   [provider]  testosterone cypionate (DEPOTESTOTERONE CYPIONATE) 100 MG/ML injection Inject 78 mg into the skin 2 (two) times a week. Mondays & Thursdays.    [provider]  Tirzepatide-Weight Management (ZEPBOUND [place]) Inject 9.5 mg into the skin every Thursday.    [provider]     Physical Exam    Vital Signs:  Jon Rodriguez does not have vital signs available for review today.  Given telephonic nature of communication, physical exam is limited. AAOx3. NAD. Normal affect.  Speech and respirations are unlabored.  Accessory Clinical Findings    None  Assessment & Plan    1.  Preoperative Cardiovascular Risk Assessment: According to the Revised Cardiac Risk Index (RCRI), his Perioperative Risk of Major Cardiac Event is (%): 0.4. His Functional Capacity in METs is: 8.23 according to the Duke Activity Status Index (DASI). The patient is doing well from a cardiac perspective. Therefore, based on ACC/AHA guidelines, the patient would be at acceptable risk for the planned procedure without further cardiovascular testing.   The patient was advised that if he develops new symptoms prior to surgery to contact our office to arrange for a follow-up visit, and he verbalized understanding.  Per office protocol, he may hold aspirin  for 5-7 days prior to procedure and should resume as soon as hemodynamically stable postoperatively.   A copy of this note will be routed to requesting surgeon.  Time:   Today, I have spent 10 minutes with the patient with telehealth technology discussing medical history, symptoms, and management plan.  Rosaline EMERSON Bane, NP-C  06/22/2024, 9:24 AM [address], [address] [place], [place] [address] Office [phone] Fax [phone]

## 2024-07-13 DIAGNOSIS — X58XXXA Exposure to other specified factors, initial encounter: Secondary | ICD-10-CM | POA: Diagnosis not present

## 2024-07-13 DIAGNOSIS — M2241 Chondromalacia patellae, right knee: Secondary | ICD-10-CM | POA: Diagnosis not present

## 2024-07-13 DIAGNOSIS — Y999 Unspecified external cause status: Secondary | ICD-10-CM | POA: Diagnosis not present

## 2024-07-13 DIAGNOSIS — M65961 Unspecified synovitis and tenosynovitis, right lower leg: Secondary | ICD-10-CM | POA: Diagnosis not present

## 2024-07-13 DIAGNOSIS — S83231A Complex tear of medial meniscus, current injury, right knee, initial encounter: Secondary | ICD-10-CM | POA: Diagnosis not present

## 2024-07-13 DIAGNOSIS — G8918 Other acute postprocedural pain: Secondary | ICD-10-CM | POA: Diagnosis not present

## 2024-07-13 DIAGNOSIS — M94261 Chondromalacia, right knee: Secondary | ICD-10-CM | POA: Diagnosis not present

## 2024-07-20 ENCOUNTER — Other Ambulatory Visit (HOSPITAL_COMMUNITY): Payer: Self-pay | Admitting: Physician Assistant

## 2024-07-20 ENCOUNTER — Ambulatory Visit (HOSPITAL_COMMUNITY)
Admission: RE | Admit: 2024-07-20 | Discharge: 2024-07-20 | Disposition: A | Discharge status: Home / Self Care | Source: Ambulatory Visit | Attending: Surgery | Admitting: Surgery

## 2024-07-20 DIAGNOSIS — R609 Edema, unspecified: Secondary | ICD-10-CM

## 2024-07-28 DIAGNOSIS — L814 Other melanin hyperpigmentation: Secondary | ICD-10-CM | POA: Diagnosis not present

## 2024-07-28 DIAGNOSIS — L821 Other seborrheic keratosis: Secondary | ICD-10-CM | POA: Diagnosis not present

## 2024-07-28 DIAGNOSIS — D0439 Carcinoma in situ of skin of other parts of face: Secondary | ICD-10-CM | POA: Diagnosis not present

## 2024-07-28 DIAGNOSIS — D1801 Hemangioma of skin and subcutaneous tissue: Secondary | ICD-10-CM | POA: Diagnosis not present

## 2024-07-28 DIAGNOSIS — D485 Neoplasm of uncertain behavior of skin: Secondary | ICD-10-CM | POA: Diagnosis not present

## 2024-08-04 ENCOUNTER — Ambulatory Visit: Payer: Self-pay | Admitting: Cardiovascular Disease

## 2024-08-04 LAB — LAB REPORT - SCANNED
A1c: 4.4
EGFR: 102

## 2024-08-07 DIAGNOSIS — D0439 Carcinoma in situ of skin of other parts of face: Secondary | ICD-10-CM | POA: Diagnosis not present

## 2024-08-12 DIAGNOSIS — R899 Unspecified abnormal finding in specimens from other organs, systems and tissues: Secondary | ICD-10-CM | POA: Diagnosis not present

## 2024-08-17 DIAGNOSIS — M25561 Pain in right knee: Secondary | ICD-10-CM | POA: Diagnosis not present

## 2024-08-19 ENCOUNTER — Other Ambulatory Visit: Payer: Self-pay | Admitting: Cardiovascular Disease

## 2024-08-26 DIAGNOSIS — M25561 Pain in right knee: Secondary | ICD-10-CM | POA: Diagnosis not present

## 2024-08-28 ENCOUNTER — Other Ambulatory Visit: Payer: Self-pay | Admitting: Cardiovascular Disease

## 2024-08-28 DIAGNOSIS — M25561 Pain in right knee: Secondary | ICD-10-CM | POA: Diagnosis not present

## 2024-08-31 DIAGNOSIS — M25561 Pain in right knee: Secondary | ICD-10-CM | POA: Diagnosis not present

## 2024-09-01 ENCOUNTER — Encounter: Payer: Self-pay | Admitting: Cardiovascular Disease

## 2024-09-01 MED ORDER — HYDROCHLOROTHIAZIDE 25 MG PO TABS: 25.0000 mg | ORAL_TABLET | Freq: Every day | ORAL | 0 refills | Status: DC

## 2024-09-01 MED ORDER — CARVEDILOL 6.25 MG PO TABS: 6.2500 mg | ORAL_TABLET | Freq: Two times a day (BID) | ORAL | 0 refills | Status: DC

## 2024-09-01 NOTE — Addendum Note (Signed)
 Addended by: JOSHUA ANDREZ PARAS on: 09/01/2024 02:18 PM   Modules accepted: Orders

## 2024-09-07 DIAGNOSIS — M25561 Pain in right knee: Secondary | ICD-10-CM | POA: Diagnosis not present

## 2024-09-09 ENCOUNTER — Encounter: Payer: Self-pay | Admitting: Cardiovascular Disease

## 2024-09-09 DIAGNOSIS — M25561 Pain in right knee: Secondary | ICD-10-CM | POA: Diagnosis not present

## 2024-09-11 DIAGNOSIS — I712 Thoracic aortic aneurysm, without rupture, unspecified: Secondary | ICD-10-CM | POA: Diagnosis not present

## 2024-09-11 DIAGNOSIS — T82898A Other specified complication of vascular prosthetic devices, implants and grafts, initial encounter: Secondary | ICD-10-CM | POA: Diagnosis not present

## 2024-09-11 DIAGNOSIS — I7121 Aneurysm of the ascending aorta, without rupture: Secondary | ICD-10-CM | POA: Diagnosis not present

## 2024-09-14 DIAGNOSIS — M25561 Pain in right knee: Secondary | ICD-10-CM | POA: Diagnosis not present

## 2024-09-15 ENCOUNTER — Encounter: Payer: Self-pay | Admitting: Cardiovascular Disease

## 2024-09-15 NOTE — Telephone Encounter (Signed)
Amlodipine 2.5 mg

## 2024-09-15 NOTE — Telephone Encounter (Signed)
 Let's start amlodipine  2.5 mh once daily , please.

## 2024-09-16 MED ORDER — AMLODIPINE BESYLATE 2.5 MG PO TABS: 2.5000 mg | ORAL_TABLET | Freq: Every day | ORAL | 2 refills | Status: DC

## 2024-09-16 NOTE — Addendum Note (Signed)
 Addended by: JOSHUA ANDREZ PARAS on: 09/16/2024 08:42 AM   Modules accepted: Orders

## 2024-09-16 NOTE — Addendum Note (Signed)
 Addended by: BETHENA POWELL SAUNDERS on: 09/16/2024 08:22 AM   Modules accepted: Orders

## 2024-09-23 ENCOUNTER — Ambulatory Visit: Attending: Cardiology | Admitting: Cardiovascular Disease

## 2024-09-23 VITALS — BP 142/82 | HR 87 | Ht 72.0 in | Wt 184.0 lb

## 2024-09-23 DIAGNOSIS — E7800 Pure hypercholesterolemia, unspecified: Secondary | ICD-10-CM

## 2024-09-23 DIAGNOSIS — Z8679 Personal history of other diseases of the circulatory system: Secondary | ICD-10-CM

## 2024-09-23 DIAGNOSIS — Z953 Presence of xenogenic heart valve: Secondary | ICD-10-CM

## 2024-09-23 DIAGNOSIS — I4729 Other ventricular tachycardia: Secondary | ICD-10-CM | POA: Diagnosis not present

## 2024-09-23 DIAGNOSIS — Z9889 Other specified postprocedural states: Secondary | ICD-10-CM

## 2024-09-23 DIAGNOSIS — I10 Essential (primary) hypertension: Secondary | ICD-10-CM | POA: Diagnosis not present

## 2024-09-23 DIAGNOSIS — R9431 Abnormal electrocardiogram [ECG] [EKG]: Secondary | ICD-10-CM

## 2024-09-23 DIAGNOSIS — I441 Atrioventricular block, second degree: Secondary | ICD-10-CM | POA: Diagnosis not present

## 2024-09-23 NOTE — Patient Instructions (Signed)
 Medication Instructions:  No changes *If you need a refill on your cardiac medications before your next appointment, please call your pharmacy*  Please send a BP log in 1 week.   Lab Work: None ordered If you have labs (blood work) drawn today and your tests are completely normal, you will receive your results only by: MyChart Message (if you have MyChart) OR A paper copy in the mail If you have any lab test that is abnormal or we need to change your treatment, we will call you to review the results.  Testing/Procedures: Your physician has requested that you have an echocardiogram. Echocardiography is a painless test that uses sound waves to create images of your heart. It provides your doctor with information about the size and shape of your heart and how well your hearts chambers and valves are working. This procedure takes approximately one hour. There are no restrictions for this procedure. Please do NOT wear cologne, perfume, aftershave, or lotions (deodorant is allowed). Please arrive 15 minutes prior to your appointment time.  Please note: We ask at that you not bring children with you during ultrasound (echo/ vascular) testing. Due to room size and safety concerns, children are not allowed in the ultrasound rooms during exams. Our front office staff cannot provide observation of children in our lobby area while testing is being conducted. An adult accompanying a patient to their appointment will only be allowed in the ultrasound room at the discretion of the ultrasound technician under special circumstances. We apologize for any inconvenience.   Follow-Up: At [facility], you and your health needs are our priority.  As part of our continuing mission to provide you with exceptional heart care, our providers are all part of one team.  This team includes your primary Cardiologist (physician) and Advanced Practice Providers or APPs (Physician Assistants and Nurse Practitioners)  who all work together to provide you with the care you need, when you need it.  Your next appointment:   1 year(s)  Provider:   Jerel Balding, MD    We recommend signing up for the patient portal called MyChart.  Sign up information is provided on this After Visit Summary.  MyChart is used to connect with patients for Virtual Visits (Telemedicine).  Patients are able to view lab/test results, encounter notes, upcoming appointments, etc.  Non-urgent messages can be sent to your provider as well.   To learn more about what you can do with MyChart, go to [email or web address].

## 2024-09-23 NOTE — Progress Notes (Signed)
 "   Cardiology Office  Note    Date:  09/23/2024   ID:  Jon Rodriguez, DOB May 20, 1961, MRN [id number]  PCP:  Okey Carlin Redbird, MD  Cardiologist:  Jerel Balding, MD  Electrophysiologist:  None   Evaluation Performed:  Follow-Up Visit  Chief Complaint:  Aortic aneurysm   History of Present Illness:    Jon Rodriguez is a 63 y.o. male with acute type a aortic dissection in 2017 with emergency surgical repair (supra coronary ascending aorta and proximal arch replacement, 2017 Dr. Fleeta Ochoa), subsequent progression of extensive aneurysm of the ascending aorta, aortic arch and descending aorta requiring two-stage surgical repair by Dr. 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  He has well-positioned thoracic stent grafts and widely patent carotid-carotid and aorta-left axillary bypass grafts.  The thoracic false lumen has completely thrombosed.  He has known residual dissection involving the left renal artery and the left common iliac artery. AVR 29 mm Edwards Perimount.  He is doing well.  He has lost a lot of weight after starting treatment with a GLP-1 agonist, but had additional weight loss after replacing testosterone deficiency.  He looks very fit.  He exercises almost daily.  His BMI is now down to 25 (at his peak weight his BMI was 37).  He has not had any problems with angina or dyspnea while exercising.  He used to have some issues with orthostatic dizziness when changing position rapidly but has known to avoid getting up too quickly and has not had any recent dizziness.  He has not had syncope or palpitations.  He does not have lower extremity edema.  We have had to decrease his dose of beta-blocker due to bradycardia.  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  He has intermittent left eye blepharospasm and is due to see neurology (Dr. Onita) on December 11.  He has an upcoming appointment with Dr. Vinita at [facility] later today.  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  However, his previous studies described a residual dilation of the sinuses of Valsalva at exactly the same diameter (5 x 4.8 x 4.9 cm).  Unfortunately, I am not able to review the actual images.  He was called by one of Dr. Vinita nurses and told that the report is really not as bad as it sounds and they will go over it later today.  His last echocardiogram was performed in 2023 at [facility] and showed normal prosthetic valve function (mean gradient 9 mmHg, normal dimensionless index 0.54 and no evidence of aortic insufficiency).   History of aortic aneurysm surgery - acute type A ascending aortic dissection with primary intimal tear close to the coronary ostia in 2017, repaired using a 30  mm Hemashield graft to the proximal arch and resuspension of the aortic valve for aortic insufficiency (Dr. fleeta Ochoa, July 2017) - Residual dilation of the aortic root to 5.2 cm with aortic insufficiency - Continued expansion of the aorta distal to the repair, diameter 6.9 cm, new breakdown along the medial aspect of the  distal anastomosis with a small contained pseudoaneurysm up to 1.4 cm and surrounding intramural hematoma.  - subsequent two-stage surgery by Dr. Vinita at [facility] for aortic aneurysm (type II hybrid arch repair).   - 01/01/2019 redo  supra coronary ascending aortic graft  (Wheat) 29 mm Carpentier-Edwards Perimount stented bovine pericardial valve, coronary reconstruction, transverse aorta placement with graft replacement, aorta  to left axillary bypass. 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  Follow-up CT shows resolution of the subclavian endoleak and shows very small type II endoleak via the intercostal arteries.   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   Past Surgical History:  Procedure Laterality Date   AORTIC VALVE REPLACEMENT     carotid endartarectomy      COLONOSCOPY  2012   KNEE ARTHROSCOPY WITH MEDIAL MENISECTOMY Left 07/20/2022   Procedure: KNEE ARTHROSCOPY WITH PARTIAL MEDIAL MENISECTOMY;  Surgeon: Sharl Selinda Dover, MD;  Location: [facility];  Service: Orthopedics;  Laterality: Left;  75   KNEE ARTHROSCOPY WITH SUBCHONDROPLASTY Left 07/20/2022   Procedure: KNEE ARTHROSCOPY WITH MEDIAL TIBIA SUBCHONDROPLASTY;  Surgeon: Sharl Selinda Dover, MD;  Location: [facility];  Service: Orthopedics;  Laterality: Left;  75   REPLACEMENT ASCENDING AORTA N/A 03/31/2016   Procedure: REPLACEMENT OF ASCENDING AORTA USING A 30mm HEMASHIELD PLATINUM VASCULAR GRAFT;  Surgeon: Maude Fleeta Ochoa, MD;  Location: [facility] OR;  Service: Open Heart Surgery;  Laterality: N/A;   SHOULDER ARTHROSCOPY WITH ROTATOR CUFF REPAIR Right 07/05/2023   Procedure: SHOULDER ARTHROSCOPY WITH ROTATOR CUFF REPAIR WITH EXTENSIVE DEBRIDEMENT;  Surgeon: Sharl Selinda Dover, MD;  Location: [facility];   Service: Orthopedics;  Laterality: Right;  120   SHOULDER ARTHROSCOPY WITH SUBACROMIAL DECOMPRESSION Right 07/05/2023   Procedure: SHOULDER ARTHROSCOPY WITH SUBACROMIAL DECOMPRESSION;  Surgeon: Sharl Selinda Dover, MD;  Location: [facility];  Service: Orthopedics;  Laterality: Right;  120   stent placed in abdomen      subclavian stenosis        Current Meds  Medication Sig   amLODipine  (NORVASC ) 2.5 MG tablet Take 1 tablet (2.5 mg total) by mouth daily.   amoxicillin  (AMOXIL ) 500 MG tablet Take 4 tablets (2,000 mg total) by mouth as needed (30-60 minutes before any dental procedure).   aspirin  81 MG EC tablet Take 1 tablet (81 mg total) by mouth daily.   carvedilol  (COREG ) 6.25 MG tablet Take 1 tablet (6.25 mg total) by mouth 2 (two) times daily with a meal.   hydrochlorothiazide  (HYDRODIURIL ) 25 MG tablet Take 1 tablet (25 mg total) by mouth daily.   Multiple Vitamins-Minerals (MULTIVITAMIN WITH MINERALS) tablet Take 1 tablet by mouth in the morning.   olmesartan  (BENICAR ) 40 MG tablet Take 1 tablet (40 mg total) by mouth daily.   Omega-3 1000 MG CAPS Take 1,000 mg by mouth every evening.  rosuvastatin  (CRESTOR ) 20 MG tablet Take 1 tablet (20 mg total) by mouth daily.   sildenafil (VIAGRA) 100 MG tablet Take 100 mg by mouth daily as needed for erectile dysfunction.   tamsulosin  (FLOMAX ) 0.4 MG CAPS capsule Take 0.4 mg by mouth every evening.   testosterone cypionate (DEPOTESTOTERONE CYPIONATE) 100 MG/ML injection Inject 78 mg into the skin 2 (two) times a week. Mondays & Thursdays.   Tirzepatide-Weight Management (ZEPBOUND [place]) Inject 9.5 mg into the skin every Thursday.     Allergies:   Quinolones and Simvastatin    Social History   Tobacco Use   Smoking status: Former    Types: Cigarettes   Smokeless tobacco: Former  Building Services Engineer status: Never Used  Substance Use Topics   Alcohol use: Yes    Comment: rare   Drug use: Never     Family Hx: The patient's family history  includes CAD in his mother; Hyperlipidemia in his father and mother; Hypertension in his father and another family member; Prostate cancer in his father.  ROS:   Please see the history of present illness.    All other systems reviewed and are negative.  Prior CV studies:   The following studies were reviewed today:  CT angiogram of the aorta 07/13/2022, [facility]: 1.  Stable thoracic aorta post surgical changes with unchanged diameter of  dilated sinuses of Valsalva (measuring up to 5.0 cm) and the thoracic aorta  measuring up to 5.8 cm at the distal aortic arch. No evidence of  postoperative complications or endoleak.  2.  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  3.  Similar, mild hypoattenuating leaflet thickening (HALT) of the  bioprosthetic aortic valve.   CT angiogram of the aorta 09/11/2024, [facility]: 1. The ascending aorta is enlarging, now measuring 5 cm in the nonstented  portion, which is secondary to an enlarging pseudoaneurysm arising from the  proximal anastomosis of the ascending aortic graft just above the  noncoronary cusp.  2. The excluded arch aneurysm and the stented portion of the descending  thoracic aorta are unchanged. The false lumen within the thorax remains  thrombosed on arterial and venous imaging.    Echocardiogram 07/13/2022, [facility]: NORMAL LEFT VENTRICULAR SYSTOLIC FUNCTION WITH MODERATE LVH    NORMAL RIGHT VENTRICULAR SYSTOLIC FUNCTION    VALVULAR REGURGITATION: TRIVIAL MR, TRIVIAL PR, TRIVIAL TR    PROSTHETIC VALVE(S): BIOPROSTHETIC AoV    3D acquisition and reconstructions were performed as part of this    examination to more accurately quantify the effects of identified    structural abnormalities as part of the exam. (post-processing on an    Independent workstation).     S/P AVR; NO AORTIC REGURGITATION TODAY    Aortic: No AR                  BIOPROSTHETIC AoV      2.0 m/s peak  vel   17 mmHg peak grad   9 mmHg mean grad 1.7 cm2 by DOPPLER   LVOT Diam: 2.0 cm. Resting LVOT Vel: 1.1 m/s. Dimensionless Index: 0.54    Labs/Other Tests and Data Reviewed:    EKG:   EKG Interpretation Date/Time:  Wednesday September 23 2024 08:18:46 EST Ventricular Rate:  87 PR Interval:  270 QRS Duration:  124 QT Interval:  420 QTC Calculation: 505 R Axis:   -18  Text Interpretation: Sinus rhythm with 1st degree A-V block Non-specific intra-ventricular conduction delay Minimal voltage  criteria for LVH, may be normal variant ( Cornell product ) Nonspecific ST abnormality When compared with ECG of 11-Sep-2023 10:00, Sinus rhythm is no longer with 2nd degree A-V block (Mobitz I) Confirmed by Addilynne Olheiser [phone]) on 09/23/2024 8:33:41 AM         Recent Labs: No results found for requested labs within last 365 days.  07/06/2019 Hemoglobin 13.7, creatinine 0.8, potassium 4.9 Recent Lipid Panel Lab Results  Component Value Date/Time   CHOL 205 (H) 04/06/2022 10:51 AM   TRIG 71 04/06/2022 10:51 AM   HDL 71 04/06/2022 10:51 AM   CHOLHDL 2.9 04/06/2022 10:51 AM   CHOLHDL 3.3 06/11/2016 08:15 AM   LDLCALC 121 (H) 04/06/2022 10:51 AM    Wt Readings from Last 3 Encounters:  09/23/24 184 lb (83.5 kg)  09/11/23 222 lb 6.4 oz (100.9 kg)  07/05/23 225 lb (102.1 kg)     Objective:    Vital Signs:  BP (!) 142/82 (BP Location: Right Arm, Patient Position: Sitting, Cuff Size: Large)   Pulse 87   Ht 6' (1.829 m)   Wt 184 lb (83.5 kg)   SpO2 98%   BMI 24.95 kg/m     General: Alert, oriented x3, no distress, appears very fit and lean Head: no evidence of trauma, PERRL, EOMI, no exophtalmos or lid lag, no myxedema, no xanthelasma; normal ears, nose and oropharynx Neck: normal jugular venous pulsations and no hepatojugular reflux; brisk carotid pulses without delay and no carotid bruits Chest: clear to auscultation, no signs of consolidation by percussion or palpation, normal  fremitus, symmetrical and full respiratory excursions Cardiovascular: normal position and quality of the apical impulse, regular rhythm with occasional dropped beats, normal first and second heart sounds, slightly more prominent and harsher 3/6 musical aortic ejection murmur radiating from the left lower sternal border and right upper sternal border towards the carotids no diastolic murmurs, rubs or gallops Abdomen: no tenderness or distention, no masses by palpation, no abnormal pulsatility or arterial bruits, normal bowel sounds, no hepatosplenomegaly Extremities: no clubbing, cyanosis or edema; 2+ radial, ulnar and brachial pulses bilaterally; 2+ right femoral, posterior tibial and dorsalis pedis pulses; 2+ left femoral, posterior tibial and dorsalis pedis pulses; no subclavian or femoral bruits Neurological: grossly nonfocal Psych: Normal mood and affect     ASSESSMENT & PLAN:    1. Hx of repair of dissecting thoracic aortic aneurysm, Stanford type A   2. History of aortic valve replacement with porcine valve   3. Essential hypertension   4. Mobitz type 1 second degree atrioventricular block   5. NSVT (nonsustained ventricular tachycardia) (HCC)   6. Hypercholesterolemia   7. QT prolongation       S/P Asc Ao Dissection/aortic aneurysm repair with redo sternotomy: Excellent recovery.  CT report from last week is concerning,, but I wonder if it is just a question of different interpretation of the same known dilation of the sinuses of Valsalva.  He has a follow-up later today with Dr. Vinita.   AVR (bioprosthetic): Normal prosthetic valve function.  Asymptomatic.  Due for repeat echocardiogram, which we will schedule today.  Reminded him of the need for endocarditis prophylaxis. HTN: He has always had some degree of situational hypertension.  Target BP 130/80 or lower.  His systolic blood pressure slightly higher today, but has been consistently high at home.  We recently added amlodipine ,  just a few days ago.  I think we should wait a few more days until we decide whether or  not we need to escalate the dose.  When he took amlodipine  in the past that seem to contribute to weight gain/fluid gain.  He is on the maximum dose of ARB and we have had to decrease his dose of carvedilol  due to second-degree AV block and bradycardia.  He continues to have mild hyponatremia and I would not want to increase his dose of hydrochlorothiazide .   Arrhythmia: No palpitations or symptoms of presyncope/syncope.  Dizziness has improved with adjustment in his position changes.  Arrhythmia monitor December 2020 showed rare episodes of nonsustained VT and nonsustained atrial tachycardia, but also showed nocturnal second-degree AV block Mobitz type I with moderate bradycardia.  He is now having AV block during daytime hours, but without significant bradycardia and he is not symptomatic. HLP: Excellent recent lipid profile.  Continue same medication.  Had myopathy with simvastatin , but seems to be tolerating rosuvastatin  well.   Prolonged QT interval: Unchanged.  This is at least partly due to the intraventricular conduction delay.  Avoid medications that can further prolong the QT interval.   Patient Instructions  Medication Instructions:  No changes *If you need a refill on your cardiac medications before your next appointment, please call your pharmacy*  Please send a BP log in 1 week.   Lab Work: None ordered If you have labs (blood work) drawn today and your tests are completely normal, you will receive your results only by: MyChart Message (if you have MyChart) OR A paper copy in the mail If you have any lab test that is abnormal or we need to change your treatment, we will call you to review the results.  Testing/Procedures: Your physician has requested that you have an echocardiogram. Echocardiography is a painless test that uses sound waves to create images of your heart. It provides your doctor  with information about the size and shape of your heart and how well your hearts chambers and valves are working. This procedure takes approximately one hour. There are no restrictions for this procedure. Please do NOT wear cologne, perfume, aftershave, or lotions (deodorant is allowed). Please arrive 15 minutes prior to your appointment time.  Please note: We ask at that you not bring children with you during ultrasound (echo/ vascular) testing. Due to room size and safety concerns, children are not allowed in the ultrasound rooms during exams. Our front office staff cannot provide observation of children in our lobby area while testing is being conducted. An adult accompanying a patient to their appointment will only be allowed in the ultrasound room at the discretion of the ultrasound technician under special circumstances. We apologize for any inconvenience.   Follow-Up: At [facility], you and your health needs are our priority.  As part of our continuing mission to provide you with exceptional heart care, our providers are all part of one team.  This team includes your primary Cardiologist (physician) and Advanced Practice Providers or APPs (Physician Assistants and Nurse Practitioners) who all work together to provide you with the care you need, when you need it.  Your next appointment:   1 year(s)  Provider:   Jerel Balding, MD    We recommend signing up for the patient portal called MyChart.  Sign up information is provided on this After Visit Summary.  MyChart is used to connect with patients for Virtual Visits (Telemedicine).  Patients are able to view lab/test results, encounter notes, upcoming appointments, etc.  Non-urgent messages can be sent to your provider as well.  To learn more about what you can do with MyChart, go to [email or web address].      Signed, Jerel Balding, MD  09/23/2024 12:10 PM    [facility] "

## 2024-09-28 ENCOUNTER — Encounter: Payer: Self-pay | Admitting: Cardiovascular Disease

## 2024-09-28 MED ORDER — AMLODIPINE BESYLATE 5 MG PO TABS: 5.0000 mg | ORAL_TABLET | Freq: Every day | ORAL | 1 refills | Status: DC

## 2024-09-28 NOTE — Telephone Encounter (Signed)
 Please increase amlodipine  to 5 mg daily and report back in 2 weeks

## 2024-09-28 NOTE — Telephone Encounter (Signed)
"  Amlodipine  5 mg sent to pharmacy  "

## 2024-09-29 ENCOUNTER — Encounter: Payer: Self-pay | Admitting: Cardiovascular Disease

## 2024-09-29 DIAGNOSIS — I10 Essential (primary) hypertension: Secondary | ICD-10-CM

## 2024-09-30 MED ORDER — CARVEDILOL 6.25 MG PO TABS: 6.2500 mg | ORAL_TABLET | Freq: Two times a day (BID) | ORAL | 3 refills | Status: AC

## 2024-09-30 MED ORDER — OLMESARTAN MEDOXOMIL 40 MG PO TABS: 40.0000 mg | ORAL_TABLET | Freq: Every day | ORAL | 3 refills | Status: AC

## 2024-09-30 MED ORDER — HYDROCHLOROTHIAZIDE 25 MG PO TABS: 25.0000 mg | ORAL_TABLET | Freq: Every day | ORAL | 3 refills | Status: AC

## 2024-09-30 MED ORDER — ROSUVASTATIN CALCIUM 20 MG PO TABS: 20.0000 mg | ORAL_TABLET | Freq: Every day | ORAL | 3 refills | Status: AC

## 2024-10-15 MED ORDER — AMLODIPINE BESYLATE 5 MG PO TABS: 7.5000 mg | ORAL_TABLET | Freq: Every day | ORAL | 3 refills | Status: DC

## 2024-10-15 NOTE — Addendum Note (Signed)
 Addended by: FRANCYNE HEADLAND on: 10/15/2024 03:05 PM   Modules accepted: Orders

## 2024-10-15 NOTE — Telephone Encounter (Signed)
Please increase amlodipine to 7.5 mg daily.

## 2024-10-27 ENCOUNTER — Encounter: Payer: Self-pay | Admitting: Cardiovascular Disease

## 2024-10-27 DIAGNOSIS — I10 Essential (primary) hypertension: Secondary | ICD-10-CM

## 2024-10-27 NOTE — Telephone Encounter (Signed)
 Please start spironolactone  12.5 mg every other day and reduce the amlodipine  to 5 mg daily. BP log in 2 weeks and BMET in 4 weeks, please

## 2024-10-28 ENCOUNTER — Ambulatory Visit (HOSPITAL_COMMUNITY)
Admission: RE | Admit: 2024-10-28 | Discharge: 2024-10-28 | Disposition: A | Payer: Self-pay | Source: Ambulatory Visit | Attending: Cardiovascular Disease | Admitting: Cardiovascular Disease

## 2024-10-28 ENCOUNTER — Ambulatory Visit: Payer: Self-pay | Admitting: Cardiovascular Disease

## 2024-10-28 DIAGNOSIS — Z952 Presence of prosthetic heart valve: Secondary | ICD-10-CM

## 2024-10-28 DIAGNOSIS — Z953 Presence of xenogenic heart valve: Secondary | ICD-10-CM

## 2024-10-28 LAB — ECHOCARDIOGRAM COMPLETE
AV Mean grad: 7 mmHg
AV Peak grad: 12 mmHg
Ao pk vel: 1.73 m/s
MV M vel: 5.25 m/s
MV Peak grad: 110.3 mmHg
S' Lateral: 3.53 cm

## 2024-10-30 MED ORDER — SPIRONOLACTONE 25 MG PO TABS: 12.5000 mg | ORAL_TABLET | ORAL | 3 refills | Status: AC

## 2024-10-30 MED ORDER — AMLODIPINE BESYLATE 5 MG PO TABS: 5.0000 mg | ORAL_TABLET | ORAL | Status: AC
